# Patient Record
Sex: Female | Born: 1965 | Race: Black or African American | Hispanic: No | Marital: Single | Smoking: Former smoker
Health system: Southern US, Community
[De-identification: ages and names within clinical notes are randomized; demographics above are authoritative.]

## PROBLEM LIST (undated history)

## (undated) DIAGNOSIS — I82409 Acute embolism and thrombosis of unspecified deep veins of unspecified lower extremity: Secondary | ICD-10-CM

## (undated) DIAGNOSIS — I639 Cerebral infarction, unspecified: Secondary | ICD-10-CM

## (undated) DIAGNOSIS — F419 Anxiety disorder, unspecified: Secondary | ICD-10-CM

## (undated) DIAGNOSIS — M329 Systemic lupus erythematosus, unspecified: Secondary | ICD-10-CM

## (undated) DIAGNOSIS — M359 Systemic involvement of connective tissue, unspecified: Secondary | ICD-10-CM

## (undated) DIAGNOSIS — I10 Essential (primary) hypertension: Secondary | ICD-10-CM

## (undated) DIAGNOSIS — G959 Disease of spinal cord, unspecified: Secondary | ICD-10-CM

## (undated) HISTORY — PX: ABDOMINAL HYSTERECTOMY: SHX81

## (undated) HISTORY — PX: BREAST BIOPSY: SHX20

---

## 2009-01-20 ENCOUNTER — Ambulatory Visit: Payer: Self-pay

## 2009-01-26 ENCOUNTER — Ambulatory Visit: Payer: Self-pay

## 2009-07-22 ENCOUNTER — Ambulatory Visit: Payer: Self-pay

## 2010-01-25 ENCOUNTER — Ambulatory Visit: Payer: Self-pay

## 2010-08-16 ENCOUNTER — Ambulatory Visit: Payer: Self-pay | Admitting: Adult Health

## 2010-09-25 ENCOUNTER — Emergency Department: Payer: Self-pay | Admitting: Emergency Medicine

## 2010-10-04 ENCOUNTER — Emergency Department: Payer: Self-pay | Admitting: Unknown Physician Specialty

## 2010-10-06 ENCOUNTER — Emergency Department: Payer: Self-pay | Admitting: Emergency Medicine

## 2011-08-30 ENCOUNTER — Ambulatory Visit: Payer: Self-pay

## 2013-07-10 ENCOUNTER — Emergency Department: Payer: Self-pay | Admitting: Emergency Medicine

## 2013-07-10 LAB — COMPREHENSIVE METABOLIC PANEL
Albumin: 4.1 g/dL (ref 3.4–5.0)
Alkaline Phosphatase: 52 U/L
Anion Gap: 8 (ref 7–16)
BUN: 11 mg/dL (ref 7–18)
Bilirubin,Total: 0.6 mg/dL (ref 0.2–1.0)
Calcium, Total: 9.7 mg/dL (ref 8.5–10.1)
Chloride: 105 mmol/L (ref 98–107)
Co2: 26 mmol/L (ref 21–32)
Creatinine: 0.83 mg/dL (ref 0.60–1.30)
EGFR (African American): >60
EGFR (Non-African Amer.): >60
Glucose: 113 mg/dL — ABNORMAL HIGH (ref 65–99)
Osmolality: 278 (ref 275–301)
Potassium: 3.7 mmol/L (ref 3.5–5.1)
SGOT(AST): 14 U/L — ABNORMAL LOW (ref 15–37)
SGPT (ALT): 12 U/L (ref 12–78)
Sodium: 139 mmol/L (ref 136–145)
Total Protein: 8.4 g/dL — ABNORMAL HIGH (ref 6.4–8.2)

## 2013-07-10 LAB — URINALYSIS, COMPLETE
Bilirubin,UR: NEGATIVE
Blood: NEGATIVE
Glucose,UR: NEGATIVE mg/dL (ref 0–75)
Leukocyte Esterase: NEGATIVE
Nitrite: NEGATIVE
Ph: 6 (ref 4.5–8.0)
Protein: 30
RBC,UR: 5 /HPF (ref 0–5)
Specific Gravity: 1.026 (ref 1.003–1.030)
Squamous Epithelial: 3
WBC UR: 1 /HPF (ref 0–5)

## 2013-07-10 LAB — CBC WITH DIFFERENTIAL/PLATELET
Basophil #: 0 10*3/uL (ref 0.0–0.1)
Basophil %: 0.3 %
Eosinophil #: 0 10*3/uL (ref 0.0–0.7)
Eosinophil %: 0 %
HCT: 42 % (ref 35.0–47.0)
HGB: 14.2 g/dL (ref 12.0–16.0)
Lymphocyte #: 0.8 10*3/uL — ABNORMAL LOW (ref 1.0–3.6)
Lymphocyte %: 8.9 %
MCH: 33 pg (ref 26.0–34.0)
MCHC: 33.8 g/dL (ref 32.0–36.0)
MCV: 98 fL (ref 80–100)
Monocyte #: 0.7 x10 3/mm (ref 0.2–0.9)
Monocyte %: 7.7 %
Neutrophil #: 7.4 10*3/uL — ABNORMAL HIGH (ref 1.4–6.5)
Neutrophil %: 83.1 %
Platelet: 176 10*3/uL (ref 150–440)
RBC: 4.29 10*6/uL (ref 3.80–5.20)
RDW: 12.8 % (ref 11.5–14.5)
WBC: 8.9 10*3/uL (ref 3.6–11.0)

## 2013-07-11 ENCOUNTER — Emergency Department: Payer: Self-pay

## 2013-07-11 LAB — CBC WITH DIFFERENTIAL/PLATELET
Basophil #: 0.1 10*3/uL (ref 0.0–0.1)
Basophil %: 1 %
Eosinophil #: 0 10*3/uL (ref 0.0–0.7)
Eosinophil %: 0.4 %
HCT: 47.2 % — ABNORMAL HIGH (ref 35.0–47.0)
HGB: 15.8 g/dL (ref 12.0–16.0)
Lymphocyte #: 1.9 10*3/uL (ref 1.0–3.6)
Lymphocyte %: 18.1 %
MCH: 33.1 pg (ref 26.0–34.0)
MCHC: 33.5 g/dL (ref 32.0–36.0)
MCV: 99 fL (ref 80–100)
Monocyte #: 0.8 x10 3/mm (ref 0.2–0.9)
Monocyte %: 7.6 %
Neutrophil #: 7.6 10*3/uL — ABNORMAL HIGH (ref 1.4–6.5)
Neutrophil %: 72.9 %
Platelet: 155 10*3/uL (ref 150–440)
RBC: 4.78 10*6/uL (ref 3.80–5.20)
RDW: 13 % (ref 11.5–14.5)
WBC: 10.4 10*3/uL (ref 3.6–11.0)

## 2013-07-11 LAB — COMPREHENSIVE METABOLIC PANEL
Albumin: 3.1 g/dL — ABNORMAL LOW (ref 3.4–5.0)
Alkaline Phosphatase: 37 U/L — ABNORMAL LOW
Anion Gap: 6 — ABNORMAL LOW (ref 7–16)
BUN: 18 mg/dL (ref 7–18)
Bilirubin,Total: 0.4 mg/dL (ref 0.2–1.0)
Calcium, Total: 8.3 mg/dL — ABNORMAL LOW (ref 8.5–10.1)
Chloride: 109 mmol/L — ABNORMAL HIGH (ref 98–107)
Co2: 26 mmol/L (ref 21–32)
Creatinine: 0.93 mg/dL (ref 0.60–1.30)
EGFR (African American): >60
EGFR (Non-African Amer.): >60
Glucose: 93 mg/dL (ref 65–99)
Osmolality: 283 (ref 275–301)
Potassium: 3.7 mmol/L (ref 3.5–5.1)
SGOT(AST): 17 U/L (ref 15–37)
SGPT (ALT): 10 U/L — ABNORMAL LOW (ref 12–78)
Sodium: 141 mmol/L (ref 136–145)
Total Protein: 6.5 g/dL (ref 6.4–8.2)

## 2013-07-11 LAB — URINALYSIS, COMPLETE
Bilirubin,UR: NEGATIVE
Glucose,UR: NEGATIVE mg/dL (ref 0–75)
Leukocyte Esterase: NEGATIVE
Nitrite: NEGATIVE
Ph: 5 (ref 4.5–8.0)
Protein: NEGATIVE
RBC,UR: <1 /HPF (ref 0–5)
Specific Gravity: 1.016 (ref 1.003–1.030)
Squamous Epithelial: 1
WBC UR: 1 /HPF (ref 0–5)

## 2013-07-11 LAB — LIPASE, BLOOD: Lipase: 112 U/L (ref 73–393)

## 2013-07-13 ENCOUNTER — Emergency Department: Payer: Self-pay | Admitting: Emergency Medicine

## 2013-07-13 LAB — CBC WITH DIFFERENTIAL/PLATELET
Basophil #: 0 10*3/uL (ref 0.0–0.1)
Basophil %: 0.3 %
Eosinophil #: 0.1 10*3/uL (ref 0.0–0.7)
Eosinophil %: 1.4 %
HCT: 40.4 % (ref 35.0–47.0)
HGB: 13.5 g/dL (ref 12.0–16.0)
Lymphocyte #: 2.3 10*3/uL (ref 1.0–3.6)
Lymphocyte %: 26.3 %
MCH: 33.2 pg (ref 26.0–34.0)
MCHC: 33.6 g/dL (ref 32.0–36.0)
MCV: 99 fL (ref 80–100)
Monocyte #: 1 x10 3/mm — ABNORMAL HIGH (ref 0.2–0.9)
Monocyte %: 11.3 %
Neutrophil #: 5.2 10*3/uL (ref 1.4–6.5)
Neutrophil %: 60.7 %
Platelet: 160 10*3/uL (ref 150–440)
RBC: 4.08 10*6/uL (ref 3.80–5.20)
RDW: 13.1 % (ref 11.5–14.5)
WBC: 8.6 10*3/uL (ref 3.6–11.0)

## 2013-07-13 LAB — COMPREHENSIVE METABOLIC PANEL
Albumin: 3.1 g/dL — ABNORMAL LOW (ref 3.4–5.0)
Alkaline Phosphatase: 34 U/L — ABNORMAL LOW
Anion Gap: 6 — ABNORMAL LOW (ref 7–16)
BUN: 19 mg/dL — ABNORMAL HIGH (ref 7–18)
Bilirubin,Total: 0.5 mg/dL (ref 0.2–1.0)
Calcium, Total: 8.7 mg/dL (ref 8.5–10.1)
Chloride: 107 mmol/L (ref 98–107)
Co2: 23 mmol/L (ref 21–32)
Creatinine: 0.82 mg/dL (ref 0.60–1.30)
EGFR (African American): >60
EGFR (Non-African Amer.): >60
Glucose: 86 mg/dL (ref 65–99)
Osmolality: 274 (ref 275–301)
Potassium: 3.4 mmol/L — ABNORMAL LOW (ref 3.5–5.1)
SGOT(AST): 22 U/L (ref 15–37)
SGPT (ALT): 10 U/L — ABNORMAL LOW (ref 12–78)
Sodium: 136 mmol/L (ref 136–145)
Total Protein: 6.5 g/dL (ref 6.4–8.2)

## 2013-07-13 LAB — URINALYSIS, COMPLETE
Bilirubin,UR: NEGATIVE
Blood: NEGATIVE
Glucose,UR: NEGATIVE mg/dL (ref 0–75)
Leukocyte Esterase: NEGATIVE
Nitrite: NEGATIVE
Ph: 5 (ref 4.5–8.0)
Protein: NEGATIVE
RBC,UR: 1 /HPF (ref 0–5)
Specific Gravity: 1.029 (ref 1.003–1.030)
Squamous Epithelial: 1
WBC UR: 2 /HPF (ref 0–5)

## 2013-07-13 LAB — TROPONIN I: Troponin-I: 0.05 ng/mL

## 2013-08-15 ENCOUNTER — Emergency Department: Payer: Self-pay | Admitting: Emergency Medicine

## 2013-10-08 ENCOUNTER — Ambulatory Visit: Payer: Self-pay

## 2013-10-30 ENCOUNTER — Ambulatory Visit: Payer: Self-pay

## 2014-08-10 ENCOUNTER — Emergency Department: Payer: Self-pay | Admitting: Emergency Medicine

## 2014-11-11 ENCOUNTER — Emergency Department
Admission: EM | Admit: 2014-11-11 | Discharge: 2014-11-11 | Disposition: A | Discharge status: Home / Self Care | Payer: Self-pay | Attending: Emergency Medicine | Admitting: Emergency Medicine

## 2014-11-11 ENCOUNTER — Emergency Department: Payer: Self-pay

## 2014-11-11 ENCOUNTER — Other Ambulatory Visit: Payer: Self-pay

## 2014-11-11 DIAGNOSIS — R110 Nausea: Secondary | ICD-10-CM | POA: Insufficient documentation

## 2014-11-11 DIAGNOSIS — R251 Tremor, unspecified: Secondary | ICD-10-CM | POA: Insufficient documentation

## 2014-11-11 DIAGNOSIS — F410 Panic disorder [episodic paroxysmal anxiety] without agoraphobia: Secondary | ICD-10-CM | POA: Insufficient documentation

## 2014-11-11 DIAGNOSIS — Z79899 Other long term (current) drug therapy: Secondary | ICD-10-CM | POA: Insufficient documentation

## 2014-11-11 DIAGNOSIS — I10 Essential (primary) hypertension: Secondary | ICD-10-CM | POA: Insufficient documentation

## 2014-11-11 DIAGNOSIS — F419 Anxiety disorder, unspecified: Secondary | ICD-10-CM

## 2014-11-11 HISTORY — DX: Essential (primary) hypertension: I10

## 2014-11-11 HISTORY — DX: Anxiety disorder, unspecified: F41.9

## 2014-11-11 LAB — BASIC METABOLIC PANEL
Anion gap: 12 (ref 5–15)
BUN: 22 mg/dL — ABNORMAL HIGH (ref 6–20)
CO2: 25 mmol/L (ref 22–32)
Calcium: 9.7 mg/dL (ref 8.9–10.3)
Chloride: 97 mmol/L — ABNORMAL LOW (ref 101–111)
Creatinine, Ser: 0.85 mg/dL (ref 0.44–1.00)
GFR calc Af Amer: >60 mL/min (ref >60)
GFR calc non Af Amer: >60 mL/min (ref >60)
Glucose, Bld: 102 mg/dL — ABNORMAL HIGH (ref 65–99)
Potassium: 3.4 mmol/L — ABNORMAL LOW (ref 3.5–5.1)
Sodium: 134 mmol/L — ABNORMAL LOW (ref 135–145)

## 2014-11-11 LAB — CBC
HCT: 43.8 % (ref 35.0–47.0)
Hemoglobin: 14.7 g/dL (ref 12.0–16.0)
MCH: 33 pg (ref 26.0–34.0)
MCHC: 33.5 g/dL (ref 32.0–36.0)
MCV: 98.7 fL (ref 80.0–100.0)
Platelets: 197 10*3/uL (ref 150–440)
RBC: 4.44 MIL/uL (ref 3.80–5.20)
RDW: 13.1 % (ref 11.5–14.5)
WBC: 7.4 10*3/uL (ref 3.6–11.0)

## 2014-11-11 LAB — TROPONIN I: Troponin I: <0.03 ng/mL (ref <0.031)

## 2014-11-11 MED ORDER — ONDANSETRON 4 MG PO TBDP
ORAL_TABLET | ORAL | Status: AC
  Administered 2014-11-11: 4 mg via ORAL
  Filled 2014-11-11: qty 1

## 2014-11-11 MED ORDER — ONDANSETRON 4 MG PO TBDP
4.0000 mg | ORAL_TABLET | Freq: Once | ORAL | Status: AC
  Administered 2014-11-11: 4 mg via ORAL

## 2014-11-11 MED ORDER — DIAZEPAM 5 MG PO TABS
10.0000 mg | ORAL_TABLET | Freq: Once | ORAL | Status: AC
  Administered 2014-11-11: 10 mg via ORAL

## 2014-11-11 MED ORDER — DIAZEPAM 5 MG PO TABS: 5.0000 mg | ORAL_TABLET | Freq: Three times a day (TID) | ORAL | Status: DC | PRN

## 2014-11-11 MED ORDER — DIAZEPAM 5 MG PO TABS
ORAL_TABLET | ORAL | Status: AC
  Administered 2014-11-11: 10 mg via ORAL
  Filled 2014-11-11: qty 2

## 2014-11-11 NOTE — ED Notes (Signed)
Pt c/o nausea, weakness, "feels like my heart is racing" since last Thursday; pt has not been seen by PCP. Pt states that she was dx with anxiety last week and started on citalopram. Took for 2 days and began to feel these sx so she stopped taking the medication. Pt alert and oriented X4, active, cooperative, pt in NAD. RR even and unlabored, color WNL.

## 2014-11-11 NOTE — ED Provider Notes (Signed)
[facility] Emergency Department Provider Note     Time seen: ----------------------------------------- 12:30 PM on 11/11/2014 -----------------------------------------    I have reviewed the triage vital signs and the nursing notes.   HISTORY  Chief Complaint Nausea and Weakness    HPI Valerie Bell is a 49 y.o. female clear for nausea weakness, and feeling her heart is racing. Patient states she has not seen her primary care doctor. She was diagnosed with anxiety last week and started on citalopram. She states she took it for 2 days and began to feel the symptoms so she stopped taking it.Patient reports intense anxiety to me, nothing seems to make it better or worse. I have seen her recently for same.     Past Medical History  Diagnosis Date  . Anxiety   . Hypertension     There are no active problems to display for this patient.   History reviewed. No pertinent past surgical history.  No current outpatient prescriptions on file.  Allergies Review of patient's allergies indicates no known allergies.  No family history on file.  Social History History  Substance Use Topics  . Smoking status: Never Smoker   . Smokeless tobacco: Not on file  . Alcohol Use: No    Review of Systems Constitutional: Negative for fever. Eyes: Negative for visual changes. ENT: Negative for sore throat. Cardiovascular: Negative for chest pain, she describes not like sensation in her breasts Respiratory: Negative for shortness of breath. Gastrointestinal: Negative for abdominal pain, vomiting and diarrhea. Genitourinary: Negative for dysuria. Musculoskeletal: Negative for back pain. Skin: Negative for rash. Neurological: Negative for headaches, focal weakness or numbness. Psychiatric: Positive for anxiety 10-point ROS otherwise negative.  ____________________________________________   PHYSICAL EXAM:  VITAL SIGNS: ED Triage Vitals  Enc Vitals  Group     BP 11/11/14 1216 148/109 mmHg     Pulse Rate 11/11/14 1216 103     Resp 11/11/14 1216 20     Temp 11/11/14 1216 98.2 F (36.8 C)     Temp Source 11/11/14 1216 Oral     SpO2 11/11/14 1216 100 %     Weight 11/11/14 1216 107 lb (48.535 kg)     Height 11/11/14 1216 5\' 3"  (1.6 m)     Head Cir --      Peak Flow --      Pain Score --      Pain Loc --      Pain Edu? --      Excl. in [place]? --     Constitutional: Alert and oriented. Moderate to severe anxiety, tremulousness Eyes: Conjunctivae are normal. PERRL. Normal extraocular movements. ENT   Head: Normocephalic and atraumatic.   Nose: No congestion/rhinnorhea.   Mouth/Throat: Mucous membranes are moist.   Neck: No stridor. Hematological/Lymphatic/Immunilogical: No cervical lymphadenopathy. Cardiovascular: Normal and symmetric distal pulses are present in all extremities. No murmurs, rubs, or gallops. Tachycardia Respiratory: Normal respiratory effort without tachypnea nor retractions. Breath sounds are clear and equal bilaterally. No wheezes/rales/rhonchi. Gastrointestinal: Soft and nontender. No distention. No abdominal bruits. There is no CVA tenderness. Musculoskeletal: Nontender with normal range of motion in all extremities. No joint effusions.  No lower extremity tenderness nor edema. Neurologic:  Normal speech and language. No gross focal neurologic deficits are appreciated. Speech is normal. No gait instability. Tremor Skin:  Skin is warm, dry and intact. No rash noted. Psychiatric: Mood and affect are normal. Speech and behavior are normal. Patient exhibits appropriate insight and judgment.  ____________________________________________  LABS (pertinent positives/negatives)  Labs Reviewed  BASIC METABOLIC PANEL - Abnormal; Notable for the following:    Sodium 134 (*)    Potassium 3.4 (*)    Chloride 97 (*)    Glucose, Bld 102 (*)    BUN 22 (*)    All other components within normal limits  CBC   TROPONIN I  CBG MONITORING, ED    EKG: Interpreted by me. Sinus rhythm with premature HO complexes, septal infarct likely, age indeterminate, T-wave abnormalities inferior and laterally. Long QT interval. Rate is 89  ____________________________________________  ED COURSE:  Pertinent labs & imaging results that were available during my care of the patient were reviewed by me and considered in my medical decision making (see chart for details).   ____________________________________________   RADIOLOGY  Chest x-ray is unremarkable  ____________________________________________    FINAL ASSESSMENT AND PLAN  Anxiety   Plan: I discussed the abnormal EKG with the patient, she will follow-up with her primary care doctor for this. She does not have any evidence of infarction here, with troponin being negative. She is feeling much better and is no longer tremulous after the Valium that I gave her. I advised to restart her Celexa and I will write for Valium when necessary for her symptoms. Stable for DC    Earleen Newport, MD   Earleen Newport, MD 11/11/14 [phone]

## 2014-11-11 NOTE — Discharge Instructions (Signed)
Panic Attacks °Panic attacks are sudden, short feelings of great fear or discomfort. You may have them for no reason when you are relaxed, when you are uneasy (anxious), or when you are sleeping.  °HOME CARE °· Take all your medicines as told. °· Check with your doctor before starting new medicines. °· Keep all doctor visits. °GET HELP IF: °· You are not able to take your medicines as told. °· Your symptoms do not get better. °· Your symptoms get worse. °GET HELP RIGHT AWAY IF: °· Your attacks seem different than your normal attacks. °· You have thoughts about hurting yourself or others. °· You take panic attack medicine and you have a side effect. °MAKE SURE YOU: °· Understand these instructions. °· Will watch your condition. °· Will get help right away if you are not doing well or get worse. °Document Released: 07/08/2010 Document Revised: 03/26/2013 Document Reviewed: 01/17/2013 °[employer]® Patient Information ©2015 [employer]. This information is not intended to replace advice given to you by your health care provider. Make sure you discuss any questions you have with your health care provider. ° °

## 2014-11-11 NOTE — ED Notes (Signed)
Patient unable to accurately qualify or quantify chest pain or shortness of breath

## 2014-11-11 NOTE — ED Notes (Signed)
Patient stable at time of dc 

## 2014-11-30 ENCOUNTER — Other Ambulatory Visit: Payer: Self-pay

## 2014-11-30 ENCOUNTER — Encounter: Payer: Self-pay | Admitting: Emergency Medicine

## 2014-11-30 ENCOUNTER — Emergency Department
Admission: EM | Admit: 2014-11-30 | Discharge: 2014-11-30 | Disposition: A | Discharge status: Home / Self Care | Payer: Self-pay | Attending: Emergency Medicine | Admitting: Emergency Medicine

## 2014-11-30 DIAGNOSIS — R110 Nausea: Secondary | ICD-10-CM | POA: Insufficient documentation

## 2014-11-30 DIAGNOSIS — I10 Essential (primary) hypertension: Secondary | ICD-10-CM | POA: Insufficient documentation

## 2014-11-30 DIAGNOSIS — Z79899 Other long term (current) drug therapy: Secondary | ICD-10-CM | POA: Insufficient documentation

## 2014-11-30 DIAGNOSIS — R42 Dizziness and giddiness: Secondary | ICD-10-CM | POA: Insufficient documentation

## 2014-11-30 DIAGNOSIS — E876 Hypokalemia: Secondary | ICD-10-CM | POA: Insufficient documentation

## 2014-11-30 DIAGNOSIS — Z87891 Personal history of nicotine dependence: Secondary | ICD-10-CM | POA: Insufficient documentation

## 2014-11-30 DIAGNOSIS — Z3202 Encounter for pregnancy test, result negative: Secondary | ICD-10-CM | POA: Insufficient documentation

## 2014-11-30 LAB — COMPREHENSIVE METABOLIC PANEL
ALT: 12 U/L — ABNORMAL LOW (ref 14–54)
AST: 24 U/L (ref 15–41)
Albumin: 4.2 g/dL (ref 3.5–5.0)
Alkaline Phosphatase: 39 U/L (ref 38–126)
Anion gap: 9 (ref 5–15)
BUN: 12 mg/dL (ref 6–20)
CO2: 30 mmol/L (ref 22–32)
Calcium: 9.3 mg/dL (ref 8.9–10.3)
Chloride: 98 mmol/L — ABNORMAL LOW (ref 101–111)
Creatinine, Ser: 0.77 mg/dL (ref 0.44–1.00)
GFR calc Af Amer: >60 mL/min (ref >60)
GFR calc non Af Amer: >60 mL/min (ref >60)
Glucose, Bld: 99 mg/dL (ref 65–99)
Potassium: 2.9 mmol/L — CL (ref 3.5–5.1)
Sodium: 137 mmol/L (ref 135–145)
Total Bilirubin: 0.3 mg/dL (ref 0.3–1.2)
Total Protein: 8.6 g/dL — ABNORMAL HIGH (ref 6.5–8.1)

## 2014-11-30 LAB — TSH: TSH: 2.777 u[IU]/mL (ref 0.350–4.500)

## 2014-11-30 LAB — URINALYSIS COMPLETE WITH MICROSCOPIC (ARMC ONLY)
Bacteria, UA: NONE SEEN
Bilirubin Urine: NEGATIVE
Glucose, UA: NEGATIVE mg/dL
Hgb urine dipstick: NEGATIVE
Ketones, ur: NEGATIVE mg/dL
Leukocytes, UA: NEGATIVE
Nitrite: NEGATIVE
Protein, ur: NEGATIVE mg/dL
Specific Gravity, Urine: 1.016 (ref 1.005–1.030)
pH: 7 (ref 5.0–8.0)

## 2014-11-30 LAB — CBC
HCT: 37.3 % (ref 35.0–47.0)
Hemoglobin: 12.6 g/dL (ref 12.0–16.0)
MCH: 33.1 pg (ref 26.0–34.0)
MCHC: 33.7 g/dL (ref 32.0–36.0)
MCV: 98.1 fL (ref 80.0–100.0)
Platelets: 205 10*3/uL (ref 150–440)
RBC: 3.8 MIL/uL (ref 3.80–5.20)
RDW: 13.1 % (ref 11.5–14.5)
WBC: 6.7 10*3/uL (ref 3.6–11.0)

## 2014-11-30 LAB — TROPONIN I: Troponin I: <0.03 ng/mL (ref <0.031)

## 2014-11-30 MED ORDER — POTASSIUM CHLORIDE CRYS ER 20 MEQ PO TBCR
40.0000 meq | EXTENDED_RELEASE_TABLET | ORAL | Status: AC
  Administered 2014-11-30: 40 meq via ORAL

## 2014-11-30 MED ORDER — SODIUM CHLORIDE 0.9 % IV BOLUS (SEPSIS)
1000.0000 mL | Freq: Once | INTRAVENOUS | Status: AC
  Administered 2014-11-30: 1000 mL via INTRAVENOUS

## 2014-11-30 MED ORDER — POTASSIUM CHLORIDE ER 10 MEQ PO TBCR: 10.0000 meq | EXTENDED_RELEASE_TABLET | Freq: Every day | ORAL | Status: DC

## 2014-11-30 MED ORDER — POTASSIUM CHLORIDE CRYS ER 20 MEQ PO TBCR
EXTENDED_RELEASE_TABLET | ORAL | Status: AC
  Administered 2014-11-30: 40 meq via ORAL
  Filled 2014-11-30: qty 2

## 2014-11-30 NOTE — ED Notes (Signed)
States weak and dizzy since 25th of May, here for previous work up

## 2014-11-30 NOTE — ED Provider Notes (Signed)
[facility] Emergency Department Provider Note  ____________________________________________  Time seen: Approximately 3:28 PM  I have reviewed the triage vital signs and the nursing notes.   HISTORY  Chief Complaint Weakness    HPI Valerie Bell is a 49 y.o. female who is been having a feeling of nausea and feeling very tired for about one month now. She states her symptoms have never really gotten better. She seen her primary care doctor, as well as been to the ER once, and has set up follow-up with cardiology in about 2 weeks at [facility].  She continues to feel tired especially while standing at work. She occasionally feels nauseated, she has not vomited. She has not had any fever or chills. No headache. No weakness in arm or leg, no difficulty speaking but she just feels very weak and "fatigued" all the time.  She has noticed that she has increased urination recently with an urge to urinate. This may or may not be associated with changing her blood pressure medicine to HCTZ around May 20.   Past Medical History  Diagnosis Date  . Anxiety   . Hypertension     There are no active problems to display for this patient.   Past Surgical History  Procedure Laterality Date  . Abdominal hysterectomy      Current Outpatient Rx  Name  Route  Sig  Dispense  Refill  . diazepam (VALIUM) 5 MG tablet   Oral   Take 1 tablet (5 mg total) by mouth every 8 (eight) hours as needed for muscle spasms.   20 tablet   0   . potassium chloride (K-DUR) 10 MEQ tablet   Oral   Take 1 tablet (10 mEq total) by mouth daily.   5 tablet   0     Allergies Review of patient's allergies indicates no known allergies.  No family history on file.  Social History History  Substance Use Topics  . Smoking status: Former Research scientist (life sciences)  . Smokeless tobacco: Not on file  . Alcohol Use: No    Review of Systems Constitutional: No fever/chills Eyes: No visual changes. ENT: No sore  throat. Cardiovascular: Denies chest pain though she was told her EKG is abnormal and has set up follow-up with cardiology. Respiratory: Denies shortness of breath. Gastrointestinal: No abdominal pain.  no vomiting.  No diarrhea.  No constipation. Genitourinary: Negative for dysuria. Increased urgency Musculoskeletal: Negative for back pain. Skin: Negative for rash. Neurological: Negative for headaches, focal weakness or numbness.  10-point ROS otherwise negative.  ____________________________________________   PHYSICAL EXAM:  VITAL SIGNS: ED Triage Vitals  Enc Vitals Group     BP 11/30/14 1149 98/79 mmHg     Pulse Rate 11/30/14 1149 92     Resp 11/30/14 1149 20     Temp 11/30/14 1149 98.2 F (36.8 C)     Temp Source 11/30/14 1149 Oral     SpO2 11/30/14 1149 100 %     Weight 11/30/14 1149 102 lb (46.267 kg)     Height 11/30/14 1149 5\' 3"  (1.6 m)     Head Cir --      Peak Flow --      Pain Score 11/30/14 1150 0     Pain Loc --      Pain Edu? --      Excl. in [place]? --    Constitutional: Alert and oriented. Overall fatigued  appearing and in no acute distress. The patient does appear underweight for  her given height and age. Eyes: Conjunctivae are normal. PERRL. EOMI. Head: Atraumatic. Nose: No congestion/rhinnorhea. Mouth/Throat: Mucous membranes are slightly dry.  Oropharynx non-erythematous. Neck: No stridor.   Cardiovascular: Normal rate, regular rhythm. Grossly normal heart sounds.  Good peripheral circulation. Respiratory: Normal respiratory effort.  No retractions. Lungs CTAB. Gastrointestinal: Soft and nontender. No distention. No abdominal bruits. No CVA tenderness. Musculoskeletal: No lower extremity tenderness nor edema.  No joint effusions. Neurologic:  Normal speech and language. No gross focal neurologic deficits are appreciated. Speech is normal. No gait instability. Skin:  Skin is warm, dry and intact. No rash noted. Psychiatric: Mood and affect are normal.  Speech and behavior are normal.  ____________________________________________   LABS (all labs ordered are listed, but only abnormal results are displayed)  Labs Reviewed  COMPREHENSIVE METABOLIC PANEL - Abnormal; Notable for the following:    Potassium 2.9 (*)    Chloride 98 (*)    Total Protein 8.6 (*)    ALT 12 (*)    All other components within normal limits  URINALYSIS COMPLETEWITH MICROSCOPIC ([facility] ONLY) - Abnormal; Notable for the following:    Color, Urine YELLOW (*)    APPearance CLEAR (*)    Squamous Epithelial / LPF 0-5 (*)    All other components within normal limits  CBC  TROPONIN I  TSH  POC URINE PREG, ED   ____________________________________________  EKG  ED ECG REPORT I, Zhanna Melin, the attending physician, personally viewed and interpreted this ECG.  Date: 11/30/2014 EKG Time: 1155 Rate: 90 Rhythm: normal sinus rhythm with occasional PACs QRS Axis: normal Intervals: normal ST/T Wave abnormalities: T wave inversions in ii,iii as well as V4 through V6  Narrative Interpretation: Sinus rhythm with frequent PACs and inferolateral T-wave abnormalities. In comparison to previous EKG from 11/11/2014  see no significant changes.    ____________________________________________  RADIOLOGY   ____________________________________________   PROCEDURES  Procedure(s) performed: None  Critical Care performed: No  ____________________________________________   INITIAL IMPRESSION / ASSESSMENT AND PLAN / ED COURSE  Pertinent labs & imaging results that were available during my care of the patient were reviewed by me and considered in my medical decision making (see chart for details).  Patient with ongoing weakness for about a month. She does appear fatigued, but has a overall reassuring exam. She is slightly underweight. On laboratory testing her potassium is noted to be low, this is likely an effect of being on her new diuretic hydrochlorothiazide.  We'll replete this in the ER. He comes of the chronicity of your ongoing weakness, I will add on a thyroid study and we will obtain a urinalysis. We will check orthostatics and hydrated. The patient does have a primary care doctor who is also following her for this weakness, and has follow-up with cardiology regarding her EKG. Her troponin is again normal today and her EKG shows no major changes from previous. She is not having any active chest pain.  I did discuss with the patient that there are many things contribute to weakness and weight loss, among these could be electrolyte problems, chronic infections, metabolic abnormalities, or even something as severe as an undiagnosed cancer. Difficult to make a diagnosis of a undiagnosed tumor in the ER, and I discussed that with the patient and she will follow up closely with her primary care doctor as well as cardiology of discharge. ____________________________________________  ----------------------------------------- 5:57 PM on 11/30/2014 -----------------------------------------  Patient's labs are reviewed. Her TSH is normal. She feels improved after hydration.  Still don't have a clear cause for her lightheadedness, though the chronicity argues against an emergent cause. Her potassium is slightly low and we will replete this likely due to her hydrochlorothiazide. I will provide her a short prescription of potassium. She will follow-up with her for further and ongoing evaluation at this point.  FINAL CLINICAL IMPRESSION(S) / ED DIAGNOSES  Final diagnoses:  Lightheaded  Hypokalemia due to loss of potassium      Delman Kitten, MD 11/30/14 1757

## 2014-11-30 NOTE — Discharge Instructions (Signed)
Dizziness  Please call your primary care doctor to set up follow-up within the next 1-3 days. Return to the ER right away should you develop severe symptoms, pass out, a fever, feel dehydrated, have increased weakness, trouble speaking, chest pain, or other new concerns arise.  Dizziness is a common problem. It is a feeling of unsteadiness or light-headedness. You may feel like you are about to faint. Dizziness can lead to injury if you stumble or fall. A person of any age group can suffer from dizziness, but dizziness is more common in older adults. CAUSES  Dizziness can be caused by many different things, including:  Middle ear problems.  Standing for too long.  Infections.  An allergic reaction.  Aging.  An emotional response to something, such as the sight of blood.  Side effects of medicines.  Tiredness.  Problems with circulation or blood pressure.  Excessive use of alcohol or medicines, or illegal drug use.  Breathing too fast (hyperventilation).  An irregular heart rhythm (arrhythmia).  A low red blood cell count (anemia).  Pregnancy.  Vomiting, diarrhea, fever, or other illnesses that cause body fluid loss (dehydration).  Diseases or conditions such as Parkinson's disease, high blood pressure (hypertension), diabetes, and thyroid problems.  Exposure to extreme heat. DIAGNOSIS  Your health care provider will ask about your symptoms, perform a physical exam, and perform an electrocardiogram (ECG) to record the electrical activity of your heart. Your health care provider may also perform other heart or blood tests to determine the cause of your dizziness. These may include:  Transthoracic echocardiogram (TTE). During echocardiography, sound waves are used to evaluate how blood flows through your heart.  Transesophageal echocardiogram (TEE).  Cardiac monitoring. This allows your health care provider to monitor your heart rate and rhythm in real time.  Holter  monitor. This is a portable device that records your heartbeat and can help diagnose heart arrhythmias. It allows your health care provider to track your heart activity for several days if needed.  Stress tests by exercise or by giving medicine that makes the heart beat faster. TREATMENT  Treatment of dizziness depends on the cause of your symptoms and can vary greatly. HOME CARE INSTRUCTIONS   Drink enough fluids to keep your urine clear or pale yellow. This is especially important in very hot weather. In older adults, it is also important in cold weather.  Take your medicine exactly as directed if your dizziness is caused by medicines. When taking blood pressure medicines, it is especially important to get up slowly.  Rise slowly from chairs and steady yourself until you feel okay.  In the morning, first sit up on the side of the bed. When you feel okay, stand slowly while holding onto something until you know your balance is fine.  Move your legs often if you need to stand in one place for a long time. Tighten and relax your muscles in your legs while standing.  Have someone stay with you for 1-2 days if dizziness continues to be a problem. Do this until you feel you are well enough to stay alone. Have the person call your health care provider if he or she notices changes in you that are concerning.  Do not drive or use heavy machinery if you feel dizzy.  Do not drink alcohol. SEEK IMMEDIATE MEDICAL CARE IF:   Your dizziness or light-headedness gets worse.  You feel nauseous or vomit.  You have problems talking, walking, or using your arms, hands, or legs.  You feel weak.  You are not thinking clearly or you have trouble forming sentences. It may take a friend or family member to notice this.  You have chest pain, abdominal pain, shortness of breath, or sweating.  Your vision changes.  You notice any bleeding.  You have side effects from medicine that seems to be getting  worse rather than better. MAKE SURE YOU:   Understand these instructions.  Will watch your condition.  Will get help right away if you are not doing well or get worse. Document Released: 11/29/2000 Document Revised: 06/10/2013 Document Reviewed: 12/23/2010 [facility] Patient Information 2015 [place], [place]. This information is not intended to replace advice given to you by your health care provider. Make sure you discuss any questions you have with your health care provider.

## 2014-12-02 LAB — POCT PREGNANCY, URINE: Preg Test, Ur: NEGATIVE

## 2014-12-30 ENCOUNTER — Other Ambulatory Visit: Payer: Self-pay | Admitting: Oncology

## 2014-12-30 ENCOUNTER — Ambulatory Visit: Payer: Self-pay | Attending: Oncology

## 2014-12-30 ENCOUNTER — Ambulatory Visit
Admission: RE | Admit: 2014-12-30 | Discharge: 2014-12-30 | Disposition: A | Discharge status: Home / Self Care | Payer: Self-pay | Source: Ambulatory Visit | Attending: Oncology | Admitting: Oncology

## 2014-12-30 VITALS — BP 155/104 | HR 73 | Temp 98.6°F | Resp 18 | Ht 64.96 in | Wt 104.3 lb

## 2014-12-30 DIAGNOSIS — R928 Other abnormal and inconclusive findings on diagnostic imaging of breast: Secondary | ICD-10-CM | POA: Insufficient documentation

## 2014-12-30 DIAGNOSIS — N63 Unspecified lump in unspecified breast: Secondary | ICD-10-CM

## 2014-12-30 NOTE — Progress Notes (Signed)
Subjective:     Patient ID: Valerie Bell, female   DOB: Jan 16, 1966, 49 y.o.   MRN: [id number]  HPI   Review of Systems     Objective:   Physical Exam  Pulmonary/Chest: Right breast exhibits mass and tenderness. Right breast exhibits no inverted nipple, no nipple discharge and no skin change. Left breast exhibits mass and tenderness. Left breast exhibits no nipple discharge and no skin change. Breasts are symmetrical.         Assessment:     49 year old patient presents for [facility] clinic visit. Patient screened, and meets B[facility]CP eligibility.  Patient does not have insurance, Medicare or Medicaid.  Handout given on Affordable Care Act. Instructed patient on breast self-exam using teach back method.  Palpated bilateral thickenings upper outer quadrants with associated tenderness. Patient being followed by PCP for intemittent chest pain, and hypertension.  Also stomach pain and nausea.    Plan:     Sent for bialteral diagnostic mammogrma, and bilateral breast ultrasound.

## 2014-12-31 NOTE — Progress Notes (Signed)
Letter mailed from [facility] to notify of normal mammogram results.  Patient to return in one year for annual screening.  Copy to [facility]. 

## 2015-01-27 ENCOUNTER — Other Ambulatory Visit: Payer: Self-pay | Admitting: Family Medicine

## 2015-01-27 DIAGNOSIS — R634 Abnormal weight loss: Secondary | ICD-10-CM

## 2015-01-28 ENCOUNTER — Ambulatory Visit
Admission: RE | Admit: 2015-01-28 | Discharge: 2015-01-28 | Disposition: A | Discharge status: Home / Self Care | Payer: Self-pay | Source: Ambulatory Visit | Attending: Family Medicine | Admitting: Family Medicine

## 2015-01-28 DIAGNOSIS — R918 Other nonspecific abnormal finding of lung field: Secondary | ICD-10-CM | POA: Insufficient documentation

## 2015-01-28 DIAGNOSIS — R634 Abnormal weight loss: Secondary | ICD-10-CM | POA: Insufficient documentation

## 2015-01-28 MED ORDER — IOHEXOL 300 MG/ML  SOLN
100.0000 mL | Freq: Once | INTRAMUSCULAR | Status: AC | PRN
  Administered 2015-01-28: 85 mL via INTRAVENOUS

## 2015-10-02 ENCOUNTER — Emergency Department
Admission: EM | Admit: 2015-10-02 | Discharge: 2015-10-02 | Disposition: A | Discharge status: Home / Self Care | Payer: Self-pay | Attending: Emergency Medicine | Admitting: Emergency Medicine

## 2015-10-02 ENCOUNTER — Encounter: Payer: Self-pay | Admitting: Emergency Medicine

## 2015-10-02 DIAGNOSIS — Z791 Long term (current) use of non-steroidal anti-inflammatories (NSAID): Secondary | ICD-10-CM | POA: Insufficient documentation

## 2015-10-02 DIAGNOSIS — M25571 Pain in right ankle and joints of right foot: Secondary | ICD-10-CM | POA: Insufficient documentation

## 2015-10-02 DIAGNOSIS — F419 Anxiety disorder, unspecified: Secondary | ICD-10-CM | POA: Insufficient documentation

## 2015-10-02 DIAGNOSIS — Z87891 Personal history of nicotine dependence: Secondary | ICD-10-CM | POA: Insufficient documentation

## 2015-10-02 DIAGNOSIS — M25579 Pain in unspecified ankle and joints of unspecified foot: Secondary | ICD-10-CM

## 2015-10-02 DIAGNOSIS — I10 Essential (primary) hypertension: Secondary | ICD-10-CM | POA: Insufficient documentation

## 2015-10-02 DIAGNOSIS — M25572 Pain in left ankle and joints of left foot: Secondary | ICD-10-CM | POA: Insufficient documentation

## 2015-10-02 LAB — CBC WITH DIFFERENTIAL/PLATELET
Basophils Absolute: 0 10*3/uL (ref 0–0.1)
Basophils Relative: 1 %
Eosinophils Absolute: 0.2 10*3/uL (ref 0–0.7)
Eosinophils Relative: 3 %
HCT: 32.6 % — ABNORMAL LOW (ref 35.0–47.0)
Hemoglobin: 11.1 g/dL — ABNORMAL LOW (ref 12.0–16.0)
Lymphocytes Relative: 22 %
Lymphs Abs: 1.4 10*3/uL (ref 1.0–3.6)
MCH: 32.8 pg (ref 26.0–34.0)
MCHC: 34.2 g/dL (ref 32.0–36.0)
MCV: 96.1 fL (ref 80.0–100.0)
Monocytes Absolute: 0.6 10*3/uL (ref 0.2–0.9)
Monocytes Relative: 9 %
Neutro Abs: 4.4 10*3/uL (ref 1.4–6.5)
Neutrophils Relative %: 67 %
Platelets: 190 10*3/uL (ref 150–440)
RBC: 3.39 MIL/uL — ABNORMAL LOW (ref 3.80–5.20)
RDW: 13.2 % (ref 11.5–14.5)
WBC: 6.5 10*3/uL (ref 3.6–11.0)

## 2015-10-02 LAB — COMPREHENSIVE METABOLIC PANEL
ALT: 14 U/L (ref 14–54)
AST: 24 U/L (ref 15–41)
Albumin: 4.6 g/dL (ref 3.5–5.0)
Alkaline Phosphatase: 45 U/L (ref 38–126)
Anion gap: 4 — ABNORMAL LOW (ref 5–15)
BUN: 17 mg/dL (ref 6–20)
CO2: 28 mmol/L (ref 22–32)
Calcium: 9.6 mg/dL (ref 8.9–10.3)
Chloride: 104 mmol/L (ref 101–111)
Creatinine, Ser: 1.05 mg/dL — ABNORMAL HIGH (ref 0.44–1.00)
GFR calc Af Amer: >60 mL/min (ref >60)
GFR calc non Af Amer: >60 mL/min (ref >60)
Glucose, Bld: 87 mg/dL (ref 65–99)
Potassium: 4.3 mmol/L (ref 3.5–5.1)
Sodium: 136 mmol/L (ref 135–145)
Total Bilirubin: 0.3 mg/dL (ref 0.3–1.2)
Total Protein: 8.8 g/dL — ABNORMAL HIGH (ref 6.5–8.1)

## 2015-10-02 LAB — SEDIMENTATION RATE: Sed Rate: 44 mm/hr — ABNORMAL HIGH (ref 0–20)

## 2015-10-02 MED ORDER — KETOROLAC TROMETHAMINE 60 MG/2ML IM SOLN
60.0000 mg | Freq: Once | INTRAMUSCULAR | Status: AC
  Administered 2015-10-02: 60 mg via INTRAMUSCULAR
  Filled 2015-10-02: qty 2

## 2015-10-02 MED ORDER — MELOXICAM 15 MG PO TABS: 15.0000 mg | ORAL_TABLET | Freq: Every day | ORAL | Status: DC

## 2015-10-02 MED ORDER — OXYCODONE-ACETAMINOPHEN 7.5-325 MG PO TABS: 1.0000 | ORAL_TABLET | ORAL | Status: AC | PRN

## 2015-10-02 MED ORDER — TRAMADOL HCL 50 MG PO TABS
50.0000 mg | ORAL_TABLET | Freq: Once | ORAL | Status: AC
Start: 2015-10-02 — End: 2015-10-02
  Administered 2015-10-02: 50 mg via ORAL
  Filled 2015-10-02: qty 1

## 2015-10-02 NOTE — ED Notes (Signed)
Throbbing pain 5th toe R foot and 1sr, 2nd and 3rd toes L foot x 1 month. Denies injury.

## 2015-10-02 NOTE — ED Notes (Signed)
Discussed discharge instructions, prescriptions, and follow-up care with patient. No questions or concerns at this time. Pt stable at discharge.  

## 2015-10-02 NOTE — ED Provider Notes (Signed)
[facility] Emergency Department Provider Note  ____________________________________________  Time seen: Approximately 1:55 PM  I have reviewed the triage vital signs and the nursing notes.   HISTORY  Chief Complaint Toe Pain    HPI Valerie Bell is a 50 y.o. female patient complain of bilateral foot pain for greater than 1 month. Patient denies any injury. Patient described a pain as throbbing. Patient stated tablet is also shooting pain to the foot. Patient states she believed onset her complaint she stopped taking potassium. Patient is rating the pain as a 10 over 10. Patient describes the pain as "sharp". Patient states she discussed this with her family doctor 2 days ago but no diagnosis or definitive treatment was prescribed. No palliative measures taken for her complaint.   Past Medical History  Diagnosis Date  . Anxiety   . Hypertension     There are no active problems to display for this patient.   Past Surgical History  Procedure Laterality Date  . Abdominal hysterectomy    . Breast biopsy Right     negative    Current Outpatient Rx  Name  Route  Sig  Dispense  Refill  . diazepam (VALIUM) 5 MG tablet   Oral   Take 1 tablet (5 mg total) by mouth every 8 (eight) hours as needed for muscle spasms.   20 tablet   0   . meloxicam (MOBIC) 15 MG tablet   Oral   Take 1 tablet (15 mg total) by mouth daily.   30 tablet   2   . oxyCODONE-acetaminophen (PERCOCET) 7.5-325 MG tablet   Oral   Take 1 tablet by mouth every 4 (four) hours as needed for severe pain.   20 tablet   0   . potassium chloride (K-DUR) 10 MEQ tablet   Oral   Take 1 tablet (10 mEq total) by mouth daily.   5 tablet   0     Allergies Review of patient's allergies indicates no known allergies.  No family history on file.  Social History Social History  Substance Use Topics  . Smoking status: Former Smoker -- 0.30 packs/day for 4 years    Types: Cigarettes   Quit date: 12/30/2003  . Smokeless tobacco: None  . Alcohol Use: No    Review of Systems Constitutional: No fever/chills Eyes: No visual changes. ENT: No sore throat. Cardiovascular: Denies chest pain. Respiratory: Denies shortness of breath. Gastrointestinal: No abdominal pain.  No nausea, no vomiting.  No diarrhea.  No constipation. Genitourinary: Negative for dysuria. Musculoskeletal: Bilateral foot pain left greater than right.  Skin: Negative for rash. Neurological: Negative for headaches, focal weakness or numbness. Psychiatric:Anxiety Endocrine:Hypertension  ____________________________________________   PHYSICAL EXAM:  VITAL SIGNS: ED Triage Vitals  Enc Vitals Group     BP 10/02/15 1323 121/91 mmHg     Pulse Rate 10/02/15 1323 97     Resp 10/02/15 1323 18     Temp 10/02/15 1323 98.5 F (36.9 C)     Temp Source 10/02/15 1323 Oral     SpO2 10/02/15 1323 100 %     Weight 10/02/15 1323 104 lb (47.174 kg)     Height 10/02/15 1323 5\' 3"  (1.6 m)     Head Cir --      Peak Flow --      Pain Score 10/02/15 1325 10     Pain Loc --      Pain Edu? --      Excl. in  [place]? --     Constitutional: Alert and oriented. Well appearing and in no acute distress. Eyes: Conjunctivae are normal. PERRL. EOMI. Head: Atraumatic. Nose: No congestion/rhinnorhea. Mouth/Throat: Mucous membranes are moist.  Oropharynx non-erythematous. Neck: No stridor.  No cervical spine tenderness to palpation. Cardiovascular: Normal rate, regular rhythm. Grossly normal heart sounds.  Good peripheral circulation. Respiratory: Normal respiratory effort.  No retractions. Lungs CTAB. Gastrointestinal: Soft and nontender. No distention. No abdominal bruits. No CVA tenderness. Musculoskeletal: No obvious edema or erythema or deformity to the bilateral foot. Patient has some moderate guarding palpation of the first second third and fifth phalanges.  Neurologic:  Normal speech and language. No gross focal  neurologic deficits are appreciated. No gait instability. Skin:  Skin is warm, dry and intact. No rash noted. Psychiatric: Mood and affect are normal. Speech and behavior are normal.  ____________________________________________   LABS (all labs ordered are listed, but only abnormal results are displayed)  Labs Reviewed  COMPREHENSIVE METABOLIC PANEL - Abnormal; Notable for the following:    Creatinine, Ser 1.05 (*)    Total Protein 8.8 (*)    Anion gap 4 (*)    All other components within normal limits  CBC WITH DIFFERENTIAL/PLATELET - Abnormal; Notable for the following:    RBC 3.39 (*)    Hemoglobin 11.1 (*)    HCT 32.6 (*)    All other components within normal limits  SEDIMENTATION RATE - Abnormal; Notable for the following:    Sed Rate 44 (*)    All other components within normal limits   ____________________________________________  EKG   ____________________________________________  RADIOLOGY   ____________________________________________   PROCEDURES  Procedure(s) performed: None  Critical Care performed: No  ____________________________________________   INITIAL IMPRESSION / ASSESSMENT AND PLAN / ED COURSE  Pertinent labs & imaging results that were available during my care of the patient were reviewed by me and considered in my medical decision making (see chart for details).  Arthralgia bilateral ankle and feet. Patient given discharge care instructions. Patient advised to follow-up with her family doctor for further evaluation and treatment. Patient given a work note. She given a prescription for Mobic and Percocets. ____________________________________________   FINAL CLINICAL IMPRESSION(S) / ED DIAGNOSES  Final diagnoses:  Arthralgia of ankle or foot, unspecified laterality      Sable Feil, PA-C 10/02/15 [place], MD 10/02/15 [phone]

## 2015-10-02 NOTE — Discharge Instructions (Signed)

## 2015-11-30 ENCOUNTER — Other Ambulatory Visit: Payer: Self-pay | Admitting: Family Medicine

## 2015-11-30 DIAGNOSIS — Z1231 Encounter for screening mammogram for malignant neoplasm of breast: Secondary | ICD-10-CM

## 2015-12-10 ENCOUNTER — Emergency Department: Payer: Self-pay

## 2015-12-10 ENCOUNTER — Emergency Department
Admission: EM | Admit: 2015-12-10 | Discharge: 2015-12-10 | Disposition: A | Discharge status: Home / Self Care | Payer: Self-pay | Attending: Emergency Medicine | Admitting: Emergency Medicine

## 2015-12-10 DIAGNOSIS — K5641 Fecal impaction: Secondary | ICD-10-CM | POA: Insufficient documentation

## 2015-12-10 DIAGNOSIS — Z87891 Personal history of nicotine dependence: Secondary | ICD-10-CM | POA: Insufficient documentation

## 2015-12-10 DIAGNOSIS — I10 Essential (primary) hypertension: Secondary | ICD-10-CM | POA: Insufficient documentation

## 2015-12-10 DIAGNOSIS — N390 Urinary tract infection, site not specified: Secondary | ICD-10-CM | POA: Insufficient documentation

## 2015-12-10 DIAGNOSIS — Z79899 Other long term (current) drug therapy: Secondary | ICD-10-CM | POA: Insufficient documentation

## 2015-12-10 LAB — URINALYSIS COMPLETE WITH MICROSCOPIC (ARMC ONLY)
Bilirubin Urine: NEGATIVE
Glucose, UA: NEGATIVE mg/dL
Hgb urine dipstick: NEGATIVE
Ketones, ur: NEGATIVE mg/dL
Nitrite: NEGATIVE
Protein, ur: NEGATIVE mg/dL
Specific Gravity, Urine: 1.026 (ref 1.005–1.030)
Squamous Epithelial / LPF: NONE SEEN
pH: 5 (ref 5.0–8.0)

## 2015-12-10 LAB — CBC
HCT: 38.3 % (ref 35.0–47.0)
Hemoglobin: 13.2 g/dL (ref 12.0–16.0)
MCH: 34.4 pg — ABNORMAL HIGH (ref 26.0–34.0)
MCHC: 34.4 g/dL (ref 32.0–36.0)
MCV: 99.9 fL (ref 80.0–100.0)
Platelets: 191 10*3/uL (ref 150–440)
RBC: 3.84 MIL/uL (ref 3.80–5.20)
RDW: 13.2 % (ref 11.5–14.5)
WBC: 17.6 10*3/uL — ABNORMAL HIGH (ref 3.6–11.0)

## 2015-12-10 LAB — BASIC METABOLIC PANEL
Anion gap: 10 (ref 5–15)
BUN: 29 mg/dL — ABNORMAL HIGH (ref 6–20)
CO2: 27 mmol/L (ref 22–32)
Calcium: 9.9 mg/dL (ref 8.9–10.3)
Chloride: 98 mmol/L — ABNORMAL LOW (ref 101–111)
Creatinine, Ser: 0.95 mg/dL (ref 0.44–1.00)
GFR calc Af Amer: >60 mL/min (ref >60)
GFR calc non Af Amer: >60 mL/min (ref >60)
Glucose, Bld: 91 mg/dL (ref 65–99)
Potassium: 3.6 mmol/L (ref 3.5–5.1)
Sodium: 135 mmol/L (ref 135–145)

## 2015-12-10 MED ORDER — ONDANSETRON HCL 4 MG/2ML IJ SOLN
4.0000 mg | Freq: Once | INTRAMUSCULAR | Status: AC
  Administered 2015-12-10: 4 mg via INTRAVENOUS

## 2015-12-10 MED ORDER — ONDANSETRON HCL 4 MG/2ML IJ SOLN
INTRAMUSCULAR | Status: AC
  Administered 2015-12-10: 4 mg via INTRAVENOUS
  Filled 2015-12-10: qty 2

## 2015-12-10 MED ORDER — CIPROFLOXACIN HCL 500 MG PO TABS: 500.0000 mg | ORAL_TABLET | Freq: Two times a day (BID) | ORAL | Status: AC

## 2015-12-10 MED ORDER — MORPHINE SULFATE (PF) 4 MG/ML IV SOLN
4.0000 mg | Freq: Once | INTRAVENOUS | Status: AC
  Administered 2015-12-10: 4 mg via INTRAVENOUS
  Filled 2015-12-10: qty 1

## 2015-12-10 MED ORDER — POLYETHYLENE GLYCOL 3350 17 G PO PACK: PACK | ORAL | Status: DC

## 2015-12-10 MED ORDER — KETOROLAC TROMETHAMINE 30 MG/ML IJ SOLN
30.0000 mg | Freq: Once | INTRAMUSCULAR | Status: AC
  Administered 2015-12-10: 30 mg via INTRAVENOUS
  Filled 2015-12-10: qty 1

## 2015-12-10 NOTE — Discharge Instructions (Signed)
Fecal Impaction A fecal impaction happens when there is a large, firm amount of stool (or feces) that cannot be passed. The impacted stool is usually in the rectum, which is the lowest part of the large bowel. The impacted stool can block the colon and cause significant problems. CAUSES  The longer stool stays in the rectum, the harder it gets. Anything that slows down your bowel movements can lead to fecal impaction, such as:  Constipation. This can be a long-standing (chronic) problem or can happen suddenly (acute).  Painful conditions of the rectum, such as hemorrhoids or anal fissures. The pain of these conditions can make you try to avoid having bowel movements.  Narcotic pain-relieving medicines, such as methadone, morphine, or codeine.  Not drinking enough fluids.  Inactivity and bed rest over long periods of time.  Diseases of the brain or nervous system that damage the nerves controlling the muscles of the intestines. SIGNS AND SYMPTOMS   Lack of normal bowel movements or changes in bowel patterns.  Sense of fullness in the rectum but unable to pass stool.  Pain or cramps in the abdominal area (often after meals).  Thin, watery discharge from the rectum. DIAGNOSIS  Your health care provider may suspect that you have a fecal impaction based on your symptoms and a physical exam. This will include an exam of your rectum. Sometimes X-rays or lab testing may be needed to confirm the diagnosis and to be sure there are no other problems.  TREATMENT   Initially an impaction can be removed manually. Using a gloved finger, your health care provider can remove hard stool from your rectum.  Medicine is sometimes needed. A suppository or enema can be given in the rectum to soften the stool, which can stimulate a bowel movement. Medicines can also be given by mouth (orally).  Though rare, surgery may be needed if the colon has torn (perforated) due to blockage. HOME CARE INSTRUCTIONS    Develop regular bowel habits. This could include getting in the habit of having a bowel movement after your morning cup of coffee or after eating. Be sure to allow yourself enough time on the toilet.  Maintain a high-fiber diet.  Drink enough fluids to keep your urine clear or pale yellow as directed by your health care provider.  Exercise regularly.  If you begin to get constipated, increase the amount of fiber in your diet. Eat plenty of fruits, vegetables, whole wheat breads, bran, oatmeal, and similar products.  Take natural fiber laxatives or other laxatives only as directed by your health care provider. SEEK MEDICAL CARE IF:   You have ongoing rectal pain.  You require enemas or suppositories more than twice a week.  You have rectal bleeding.  You have continued problems, or you develop abdominal pain.  You have thin, pencil-like stools. SEEK IMMEDIATE MEDICAL CARE IF:  You have black or tarry stools. MAKE SURE YOU:   Understand these instructions.  Will watch your condition.  Will get help right away if you are not doing well or get worse.   This information is not intended to replace advice given to you by your health care provider. Make sure you discuss any questions you have with your health care provider.   Document Released: 02/26/2004 Document Revised: 03/26/2013 Document Reviewed: 12/10/2012 Elsevier Interactive Patient Education 2016 [employer]  Urinary Tract Infection Urinary tract infections (UTIs) can develop anywhere along your urinary tract. Your urinary tract is your body's drainage system for removing wastes  and extra water. Your urinary tract includes two kidneys, two ureters, a bladder, and a urethra. Your kidneys are a pair of bean-shaped organs. Each kidney is about the size of your fist. They are located below your ribs, one on each side of your spine. CAUSES Infections are caused by microbes, which are microscopic organisms, including fungi,  viruses, and bacteria. These organisms are so small that they can only be seen through a microscope. Bacteria are the microbes that most commonly cause UTIs. SYMPTOMS  Symptoms of UTIs may vary by age and gender of the patient and by the location of the infection. Symptoms in young women typically include a frequent and intense urge to urinate and a painful, burning feeling in the bladder or urethra during urination. Older women and men are more likely to be tired, shaky, and weak and have muscle aches and abdominal pain. A fever may mean the infection is in your kidneys. Other symptoms of a kidney infection include pain in your back or sides below the ribs, nausea, and vomiting. DIAGNOSIS To diagnose a UTI, your caregiver will ask you about your symptoms. Your caregiver will also ask you to provide a urine sample. The urine sample will be tested for bacteria and white blood cells. White blood cells are made by your body to help fight infection. TREATMENT  Typically, UTIs can be treated with medication. Because most UTIs are caused by a bacterial infection, they usually can be treated with the use of antibiotics. The choice of antibiotic and length of treatment depend on your symptoms and the type of bacteria causing your infection. HOME CARE INSTRUCTIONS  If you were prescribed antibiotics, take them exactly as your caregiver instructs you. Finish the medication even if you feel better after you have only taken some of the medication.  Drink enough water and fluids to keep your urine clear or pale yellow.  Avoid caffeine, tea, and carbonated beverages. They tend to irritate your bladder.  Empty your bladder often. Avoid holding urine for long periods of time.  Empty your bladder before and after sexual intercourse.  After a bowel movement, women should cleanse from front to back. Use each tissue only once. SEEK MEDICAL CARE IF:   You have back pain.  You develop a fever.  Your symptoms do  not begin to resolve within 3 days. SEEK IMMEDIATE MEDICAL CARE IF:   You have severe back pain or lower abdominal pain.  You develop chills.  You have nausea or vomiting.  You have continued burning or discomfort with urination. MAKE SURE YOU:   Understand these instructions.  Will watch your condition.  Will get help right away if you are not doing well or get worse.   This information is not intended to replace advice given to you by your health care provider. Make sure you discuss any questions you have with your health care provider.   Document Released: 03/15/2005 Document Revised: 02/24/2015 Document Reviewed: 07/14/2011 Elsevier Interactive Patient Education [employer].

## 2015-12-10 NOTE — ED Notes (Signed)
Pt transported to CT ?

## 2015-12-10 NOTE — ED Provider Notes (Signed)
[facility] Emergency Department Provider Note        Time seen: ----------------------------------------- 9:29 AM on 12/10/2015 -----------------------------------------    I have reviewed the triage vital signs and the nursing notes.   HISTORY  Chief Complaint Flank Pain    HPI Valerie Bell is a 50 y.o. female who presents to ER for left flank pain that began yesterday. She's never had this happen before, denies any recent injury or trauma. She states is dull, nothing makes it better or worse.   Past Medical History  Diagnosis Date  . Anxiety   . Hypertension     There are no active problems to display for this patient.   Past Surgical History  Procedure Laterality Date  . Abdominal hysterectomy    . Breast biopsy Right     negative    Allergies Review of patient's allergies indicates no known allergies.  Social History Social History  Substance Use Topics  . Smoking status: Former Smoker -- 0.30 packs/day for 4 years    Types: Cigarettes    Quit date: 12/30/2003  . Smokeless tobacco: None  . Alcohol Use: No    Review of Systems Constitutional: Negative for fever. Cardiovascular: Negative for chest pain. Respiratory: Negative for shortness of breath. Gastrointestinal:Positive for flank pain Genitourinary: Negative for dysuria. Musculoskeletal: Negative for back pain. Skin: Negative for rash. Neurological: Negative for headaches, focal weakness or numbness.  10-point ROS otherwise negative.  ____________________________________________   PHYSICAL EXAM:  VITAL SIGNS: ED Triage Vitals  Enc Vitals Group     BP 12/10/15 0916 104/84 mmHg     Pulse Rate 12/10/15 0916 80     Resp 12/10/15 0916 16     Temp --      Temp src --      SpO2 12/10/15 0916 100 %     Weight --      Height --      Head Cir --      Peak Flow --      Pain Score 12/10/15 0902 10     Pain Loc --      Pain Edu? --      Excl. in [place]? --      Constitutional: Alert and oriented. Mild distress Eyes: Conjunctivae are normal. Normal extraocular movements. ENT   Head: Normocephalic and atraumatic.   Nose: No congestion/rhinnorhea.   Mouth/Throat: Mucous membranes are moist.   Neck: No stridor. Cardiovascular: Normal rate, regular rhythm. No murmurs, rubs, or gallops. Respiratory: Normal respiratory effort without tachypnea nor retractions. Breath sounds are clear and equal bilaterally. No wheezes/rales/rhonchi. Gastrointestinal: Left flank tenderness, no rebound or guarding. Normal bowel sounds. CVA appears normal to percussion Musculoskeletal: Nontender with normal range of motion in all extremities. No lower extremity tenderness nor edema. Neurologic:  Normal speech and language. No gross focal neurologic deficits are appreciated.  Skin:  Skin is warm, dry and intact. No rash noted. ____________________________________________  ED COURSE:  Pertinent labs & imaging results that were available during my care of the patient were reviewed by me and considered in my medical decision making (see chart for details). Patient is no acute distress, will check basic labs and urine. Consider CT imaging for possible renal colic ____________________________________________    LABS (pertinent positives/negatives)  Labs Reviewed  URINALYSIS COMPLETEWITH MICROSCOPIC ([facility] ONLY) - Abnormal; Notable for the following:    Color, Urine YELLOW (*)    APPearance CLEAR (*)    Leukocytes, UA 1+ (*)  Bacteria, UA MANY (*)    All other components within normal limits  CBC - Abnormal; Notable for the following:    WBC 17.6 (*)    MCH 34.4 (*)    All other components within normal limits  BASIC METABOLIC PANEL - Abnormal; Notable for the following:    Chloride 98 (*)    BUN 29 (*)    All other components within normal limits    RADIOLOGY Images were viewed by me  CT renal protocol IMPRESSION: Constipation with fecal  impaction in the rectum. No evidence of obstructive nephropathy. ____________________________________________  FINAL ASSESSMENT AND PLAN  Flank pain, Constipation, UTI  Plan: Patient with labs and imaging as dictated above. Patient is in no acute distress, pain seems to be from fecal impaction. I did offer disimpaction and enema but she has declined at this time. She'll be placed on MiraLAX and will be advised to drink the whole bottle today. She will also be on Cipro for UTI. She stable for outpatient follow-up.   Earleen Newport, MD   Note: This dictation was prepared with Dragon dictation. Any transcriptional errors that result from this process are unintentional   Earleen Newport, MD 12/10/15 1055

## 2015-12-10 NOTE — ED Notes (Signed)
Pt arrives to ER via POV c/o left sided flank pain that began yesterday. No hx of similar, denies injury. Pt ambulatory. Pt alert and oriented X4, active, cooperative, pt in NAD. RR even and unlabored, color WNL.

## 2015-12-15 ENCOUNTER — Ambulatory Visit: Payer: Self-pay

## 2016-01-12 ENCOUNTER — Ambulatory Visit: Payer: Self-pay | Attending: Internal Medicine

## 2016-01-12 ENCOUNTER — Ambulatory Visit
Admission: RE | Admit: 2016-01-12 | Discharge: 2016-01-12 | Disposition: A | Discharge status: Home / Self Care | Payer: Self-pay | Source: Ambulatory Visit | Attending: Internal Medicine | Admitting: Internal Medicine

## 2016-01-12 DIAGNOSIS — Z0000 Encounter for general adult medical examination without abnormal findings: Secondary | ICD-10-CM

## 2016-01-12 DIAGNOSIS — N63 Unspecified lump in unspecified breast: Secondary | ICD-10-CM

## 2016-01-12 NOTE — Progress Notes (Signed)
Subjective:     Patient ID: Valerie Bell, female   DOB: [birth year], 50 y.o.   MRN: [id number]  HPI   Review of Systems     Objective:   Physical Exam  Pulmonary/Chest: Right breast exhibits no inverted nipple, no mass, no nipple discharge, no skin change and no tenderness. Left breast exhibits no inverted nipple, no mass, no nipple discharge, no skin change and no tenderness. Breasts are asymmetrical.    Right breast larger than left;  Nodularity more prominent       Assessment:     50 [facility] [place] [facility] visitPatient screened, and meets [facility] eligibility.  Patient does not have insurance, Medicare or Medicaid.  Handout given on Affordable Care Act. Instructed patient on breast self-exam using teach back method.  CBE reveals bilateral thickening upper outer quadrant.  Right breast larger than left.  These findings were noted on last exam.  At that time patient had a diagnostic mammogrma, and  Ultrasound with negative results.    Plan:     Sent for bilateral screening mammogram.

## 2016-02-02 NOTE — Progress Notes (Signed)
Letter mailed from [facility] to notify of normal mammogram results.  Patient to return in one year for annual screening.  Copy to [facility]. 

## 2016-12-12 ENCOUNTER — Encounter: Payer: Self-pay | Admitting: *Deleted

## 2016-12-12 ENCOUNTER — Emergency Department
Admission: EM | Admit: 2016-12-12 | Discharge: 2016-12-12 | Disposition: A | Discharge status: Home / Self Care | Payer: Self-pay | Attending: Emergency Medicine | Admitting: Emergency Medicine

## 2016-12-12 DIAGNOSIS — X509XXA Other and unspecified overexertion or strenuous movements or postures, initial encounter: Secondary | ICD-10-CM | POA: Insufficient documentation

## 2016-12-12 DIAGNOSIS — Z87891 Personal history of nicotine dependence: Secondary | ICD-10-CM | POA: Insufficient documentation

## 2016-12-12 DIAGNOSIS — S39012A Strain of muscle, fascia and tendon of lower back, initial encounter: Secondary | ICD-10-CM | POA: Insufficient documentation

## 2016-12-12 DIAGNOSIS — Z791 Long term (current) use of non-steroidal anti-inflammatories (NSAID): Secondary | ICD-10-CM | POA: Insufficient documentation

## 2016-12-12 DIAGNOSIS — I10 Essential (primary) hypertension: Secondary | ICD-10-CM | POA: Insufficient documentation

## 2016-12-12 DIAGNOSIS — Y990 Civilian activity done for income or pay: Secondary | ICD-10-CM | POA: Insufficient documentation

## 2016-12-12 DIAGNOSIS — Y939 Activity, unspecified: Secondary | ICD-10-CM | POA: Insufficient documentation

## 2016-12-12 DIAGNOSIS — Y929 Unspecified place or not applicable: Secondary | ICD-10-CM | POA: Insufficient documentation

## 2016-12-12 DIAGNOSIS — Z760 Encounter for issue of repeat prescription: Secondary | ICD-10-CM | POA: Insufficient documentation

## 2016-12-12 MED ORDER — HYDRALAZINE HCL 25 MG PO TABS: 25.0000 mg | ORAL_TABLET | Freq: Two times a day (BID) | ORAL | 11 refills | Status: DC

## 2016-12-12 MED ORDER — CYCLOBENZAPRINE HCL 10 MG PO TABS: 10.0000 mg | ORAL_TABLET | Freq: Three times a day (TID) | ORAL | 0 refills | Status: DC | PRN

## 2016-12-12 MED ORDER — SPIRONOLACTONE 25 MG PO TABS: 25.0000 mg | ORAL_TABLET | Freq: Two times a day (BID) | ORAL | 11 refills | Status: DC

## 2016-12-12 MED ORDER — LISINOPRIL 40 MG PO TABS: 40.0000 mg | ORAL_TABLET | Freq: Every day | ORAL | 11 refills | Status: DC

## 2016-12-12 MED ORDER — TRAMADOL HCL 50 MG PO TABS: 50.0000 mg | ORAL_TABLET | Freq: Four times a day (QID) | ORAL | 0 refills | Status: AC | PRN

## 2016-12-12 NOTE — ED Provider Notes (Signed)
[facility] Emergency Department Provider Note   ____________________________________________   First MD Initiated Contact with Patient 12/12/16 1724     (approximate)  I have reviewed the triage vital signs and the nursing notes.   HISTORY  Chief Complaint Back Pain    HPI Valerie Bell is a 51 y.o. female patient complaining of low back pain for 2 days. Patient state she started new job which requires repetitive lifting and bending. Patient denies any radicular component to her back pain. Patient denies any urinary complaints. Patient states elevated blood pressure secondary to being out of medications. Patient denies headache, visual disturbances or vertigo. Patient recently has obtained insurance but does not have a PCP. Patient states she like to reestablish care with previous PCP and will try to make an appointment next week.Patient rates her back pain as a 10 over 10. Patient stated this pain and spasms.  Past Medical History:  Diagnosis Date  . Anxiety   . Hypertension     There are no active problems to display for this patient.   Past Surgical History:  Procedure Laterality Date  . ABDOMINAL HYSTERECTOMY    . BREAST BIOPSY Right 17+ years ago   negative Core Bx    Prior to Admission medications   Medication Sig Start Date End Date Taking? Authorizing Provider  cyclobenzaprine (FLEXERIL) 10 MG tablet Take 1 tablet (10 mg total) by mouth 3 (three) times daily as needed. 12/12/16   Sable Feil, PA-C  diazepam (VALIUM) 5 MG tablet Take 1 tablet (5 mg total) by mouth every 8 (eight) hours as needed for muscle spasms. 11/11/14   Earleen Newport, MD  hydrALAZINE (APRESOLINE) 25 MG tablet Take 1 tablet (25 mg total) by mouth 2 (two) times daily with a meal. 12/12/16 12/12/17  Sable Feil, PA-C  lisinopril (PRINIVIL,ZESTRIL) 40 MG tablet Take 1 tablet (40 mg total) by mouth daily. 12/12/16   Sable Feil, PA-C  meloxicam (MOBIC) 15 MG  tablet Take 1 tablet (15 mg total) by mouth daily. 10/02/15   Sable Feil, PA-C  polyethylene glycol [facility] / Floria Raveling) packet Drink the whole bottle today for constipation 12/10/15   Earleen Newport, MD  potassium chloride (K-DUR) 10 MEQ tablet Take 1 tablet (10 mEq total) by mouth daily. 11/30/14   Delman Kitten, MD  spironolactone (ALDACTONE) 25 MG tablet Take 1 tablet (25 mg total) by mouth 2 (two) times daily. 12/12/16 12/12/17  Sable Feil, PA-C  traMADol (ULTRAM) 50 MG tablet Take 1 tablet (50 mg total) by mouth every 6 (six) hours as needed. 12/12/16 12/12/17  Sable Feil, PA-C    Allergies Patient has no known allergies.  Family History  Problem Relation Age of Onset  . Breast cancer Neg Hx     Social History Social History  Substance Use Topics  . Smoking status: Former Smoker    Packs/day: 0.30    Years: 4.00    Types: Cigarettes    Quit date: 12/30/2003  . Smokeless tobacco: Never Used  . Alcohol use No    Review of Systems  Constitutional: No fever/chills Eyes: No visual changes. ENT: No sore throat. Cardiovascular: Denies chest pain. Respiratory: Denies shortness of breath. Gastrointestinal: No abdominal pain.  No nausea, no vomiting.  No diarrhea.  No constipation. Genitourinary: Negative for dysuria. Musculoskeletal: Positive for back pain. Skin: Negative for rash. Neurological: Negative for headaches, focal weakness or numbness. Psychiatric:Anxiety Endocrine:Hypertension  ____________________________________________   PHYSICAL  EXAM:  VITAL SIGNS: ED Triage Vitals  Enc Vitals Group     BP 12/12/16 1633 (!) 173/110     Pulse Rate 12/12/16 1633 71     Resp 12/12/16 1633 18     Temp 12/12/16 1633 98.7 F (37.1 C)     Temp Source 12/12/16 1633 Oral     SpO2 12/12/16 1633 100 %     Weight 12/12/16 1630 101 lb (45.8 kg)     Height 12/12/16 1630 5\' 3"  (1.6 m)     Head Circumference --      Peak Flow --      Pain Score 12/12/16 1630 10       Pain Loc --      Pain Edu? --      Excl. in [place]? --     Constitutional: Alert and oriented. Well appearing and in no acute distress. Cardiovascular: Normal rate, regular rhythm. Grossly normal heart sounds.  Good peripheral circulation.Elevated blood pressure Respiratory: Normal respiratory effort.  No retractions. Lungs CTAB. Gastrointestinal: Soft and nontender. No distention. No abdominal bruits. No CVA tenderness. Musculoskeletal: Patient's is in stable heavy reliance on upper extremity. Patient moving hesitant manner. No obvious spinal deformity. Patient has no guarding palpation spinal processes. Patient has moderate guarding palpation left paraspinal muscle area. Increased muscle spasm with right lateral movements. Patient has negative straight leg test. Neurologic:  Normal speech and language. No gross focal neurologic deficits are appreciated. No gait instability. Skin:  Skin is warm, dry and intact. No rash noted. Psychiatric: Mood and affect are normal. Speech and behavior are normal.  ____________________________________________   LABS (all labs ordered are listed, but only abnormal results are displayed)  Labs Reviewed - No data to display ____________________________________________  EKG   ____________________________________________  RADIOLOGY  No results found.  ____________________________________________   PROCEDURES  Procedure(s) performed: None  Procedures  Critical Care performed: No  ____________________________________________   INITIAL IMPRESSION / ASSESSMENT AND PLAN / ED COURSE  Pertinent labs & imaging results that were available during my care of the patient were reviewed by me and considered in my medical decision making (see chart for details).  Lumbar strain and hypertension. Patient given discharge care instructions. Patient given a work note. Patient advised to reestablish care with PCP.       ____________________________________________   FINAL CLINICAL IMPRESSION(S) / ED DIAGNOSES  Final diagnoses:  Strain of lumbar region, initial encounter  Essential hypertension  Encounter for medication refill      NEW MEDICATIONS STARTED DURING THIS VISIT:  New Prescriptions   CYCLOBENZAPRINE (FLEXERIL) 10 MG TABLET    Take 1 tablet (10 mg total) by mouth 3 (three) times daily as needed.   HYDRALAZINE (APRESOLINE) 25 MG TABLET    Take 1 tablet (25 mg total) by mouth 2 (two) times daily with a meal.   LISINOPRIL (PRINIVIL,ZESTRIL) 40 MG TABLET    Take 1 tablet (40 mg total) by mouth daily.   SPIRONOLACTONE (ALDACTONE) 25 MG TABLET    Take 1 tablet (25 mg total) by mouth 2 (two) times daily.   TRAMADOL (ULTRAM) 50 MG TABLET    Take 1 tablet (50 mg total) by mouth every 6 (six) hours as needed.     Note:  This document was prepared using Dragon voice recognition software and may include unintentional dictation errors.    Sable Feil, PA-C 12/12/16 1747    Hinda Kehr, MD 12/12/16 2007

## 2016-12-12 NOTE — ED Notes (Signed)
See triage note.  Having lower back pain for couple of days   W/o injury   Ambulates well to treatment room also out of meds for her blood pressure,

## 2016-12-12 NOTE — ED Triage Notes (Addendum)
Pt has low back pain for 2 days.   No known injury.  No urinary sx.  Pt alert.  Out of blood pressure meds for 1 year.

## 2017-01-10 ENCOUNTER — Ambulatory Visit: Payer: Self-pay | Attending: Oncology | Admitting: *Deleted

## 2017-01-10 ENCOUNTER — Ambulatory Visit
Admission: RE | Admit: 2017-01-10 | Discharge: 2017-01-10 | Disposition: A | Discharge status: Home / Self Care | Payer: Self-pay | Source: Ambulatory Visit | Attending: Oncology | Admitting: Oncology

## 2017-01-10 ENCOUNTER — Encounter: Payer: Self-pay | Admitting: *Deleted

## 2017-01-10 VITALS — BP 117/84 | HR 76 | Temp 98.7°F | Ht 63.0 in | Wt 97.0 lb

## 2017-01-10 DIAGNOSIS — Z0000 Encounter for general adult medical examination without abnormal findings: Secondary | ICD-10-CM

## 2017-01-10 NOTE — Patient Instructions (Signed)
Gave patient hand-out, Women Staying Healthy, Active and Well from [facility], with education on breast health, pap smears, heart and colon health. 

## 2017-01-10 NOTE — Progress Notes (Signed)
Subjective:     Patient ID: Valerie Bell, female   DOB: May 28, 1966, 51 y.o.   MRN: [id number]  HPI   Review of Systems     Objective:   Physical Exam  Pulmonary/Chest: Right breast exhibits inverted nipple. Right breast exhibits no mass, no nipple discharge, no skin change and no tenderness. Left breast exhibits inverted nipple. Left breast exhibits no mass, no nipple discharge, no skin change and no tenderness. Breasts are symmetrical.    Abdominal: There is no splenomegaly or hepatomegaly.    Genitourinary: No labial fusion. There is no rash, tenderness, lesion or injury on the right labia. There is no rash, tenderness, lesion or injury on the left labia. No erythema, tenderness or bleeding in the vagina. No foreign body in the vagina. No signs of injury around the vagina. No vaginal discharge found.  Skin: Bruising noted. There is cyanosis.          Assessment:     51 year old Black female returns to [facility] for annual screening.  She has lost approximately 8 lbs since her last exam.  States she has been diagnosed with Lupus.  Patient's face, trunk and extremities are covered with a reddish bruising like rash.  Patient complains of left toe pain.  States she went to the ED about the pain and they said she had arthritis.  The patient has bilateral equal pedal pulses.  The left big toe is cyanotic.  The patient also states she has pulmonary nodules the pulmonologist is following.  Discussed with the patient that maybe the lung nodules and the blue painful toe, is possibly related to the Lupus.  States she is not being treated at this time for the Lupus.  She is only seeing her primary care provider.    Clinical breast exam unremarkable.  Taught self breast awareness.  The patient has had a hysterectomy, but has one ovary.  Pelvic exam without masses or lesions.  Patient has been screened for eligibility.  She does not have any insurance, Medicare or Medicaid.  She also meets financial  eligibility.  Hand-out given on the Affordable Care Act.          Plan:     Screening mammogram ordered.  Reviewed again need for a physician specializing in Lupus.  Number given to the Rheumatology (Lupus) [facility] in [place].  Encouraged her to  discuss the lung nodules with the Lupus physician and to inform her pulmonologist of her diagnosis of Lupus.  Will follow-up per [facility] protocol.

## 2017-01-12 ENCOUNTER — Encounter: Payer: Self-pay | Admitting: *Deleted

## 2017-01-12 NOTE — Progress Notes (Signed)
Letter mailed from the [facility] to inform patient of her normal mammogram results.  Patient is to follow-up with annual screening in one year.  HSIS to [facility]. 

## 2017-03-30 ENCOUNTER — Emergency Department: Payer: Medicaid Other

## 2017-03-30 ENCOUNTER — Observation Stay
Admission: EM | Admit: 2017-03-30 | Discharge: 2017-03-31 | Disposition: A | Discharge status: Home / Self Care | Payer: Medicaid Other | Attending: Internal Medicine | Admitting: Internal Medicine

## 2017-03-30 ENCOUNTER — Other Ambulatory Visit: Payer: Self-pay | Admitting: Radiology

## 2017-03-30 DIAGNOSIS — N281 Cyst of kidney, acquired: Secondary | ICD-10-CM | POA: Insufficient documentation

## 2017-03-30 DIAGNOSIS — M359 Systemic involvement of connective tissue, unspecified: Secondary | ICD-10-CM | POA: Diagnosis not present

## 2017-03-30 DIAGNOSIS — R112 Nausea with vomiting, unspecified: Secondary | ICD-10-CM | POA: Diagnosis not present

## 2017-03-30 DIAGNOSIS — I513 Intracardiac thrombosis, not elsewhere classified: Secondary | ICD-10-CM | POA: Insufficient documentation

## 2017-03-30 DIAGNOSIS — M329 Systemic lupus erythematosus, unspecified: Secondary | ICD-10-CM | POA: Diagnosis not present

## 2017-03-30 DIAGNOSIS — R9389 Abnormal findings on diagnostic imaging of other specified body structures: Secondary | ICD-10-CM

## 2017-03-30 DIAGNOSIS — I10 Essential (primary) hypertension: Secondary | ICD-10-CM | POA: Diagnosis not present

## 2017-03-30 DIAGNOSIS — R1013 Epigastric pain: Secondary | ICD-10-CM | POA: Insufficient documentation

## 2017-03-30 DIAGNOSIS — Z79899 Other long term (current) drug therapy: Secondary | ICD-10-CM | POA: Diagnosis not present

## 2017-03-30 DIAGNOSIS — I7410 Embolism and thrombosis of unspecified parts of aorta: Secondary | ICD-10-CM

## 2017-03-30 DIAGNOSIS — R079 Chest pain, unspecified: Secondary | ICD-10-CM | POA: Diagnosis not present

## 2017-03-30 DIAGNOSIS — F419 Anxiety disorder, unspecified: Secondary | ICD-10-CM | POA: Diagnosis not present

## 2017-03-30 DIAGNOSIS — Z87891 Personal history of nicotine dependence: Secondary | ICD-10-CM | POA: Insufficient documentation

## 2017-03-30 HISTORY — DX: Systemic involvement of connective tissue, unspecified: M35.9

## 2017-03-30 LAB — COMPREHENSIVE METABOLIC PANEL
ALT: 10 U/L — ABNORMAL LOW (ref 14–54)
AST: 20 U/L (ref 15–41)
Albumin: 4.4 g/dL (ref 3.5–5.0)
Alkaline Phosphatase: 57 U/L (ref 38–126)
Anion gap: 9 (ref 5–15)
BUN: 19 mg/dL (ref 6–20)
CO2: 25 mmol/L (ref 22–32)
Calcium: 9.5 mg/dL (ref 8.9–10.3)
Chloride: 104 mmol/L (ref 101–111)
Creatinine, Ser: 0.98 mg/dL (ref 0.44–1.00)
GFR calc Af Amer: >60 mL/min (ref >60)
GFR calc non Af Amer: >60 mL/min (ref >60)
Glucose, Bld: 96 mg/dL (ref 65–99)
Potassium: 3.8 mmol/L (ref 3.5–5.1)
Sodium: 138 mmol/L (ref 135–145)
Total Bilirubin: 0.5 mg/dL (ref 0.3–1.2)
Total Protein: 9 g/dL — ABNORMAL HIGH (ref 6.5–8.1)

## 2017-03-30 LAB — CBC
HCT: 36.7 % (ref 35.0–47.0)
Hemoglobin: 12.8 g/dL (ref 12.0–16.0)
MCH: 34.3 pg — ABNORMAL HIGH (ref 26.0–34.0)
MCHC: 34.9 g/dL (ref 32.0–36.0)
MCV: 98.3 fL (ref 80.0–100.0)
Platelets: 158 10*3/uL (ref 150–440)
RBC: 3.74 MIL/uL — ABNORMAL LOW (ref 3.80–5.20)
RDW: 13 % (ref 11.5–14.5)
WBC: 7.3 10*3/uL (ref 3.6–11.0)

## 2017-03-30 LAB — TROPONIN I
Troponin I: <0.03 ng/mL (ref <0.03)
Troponin I: <0.03 ng/mL (ref <0.03)
Troponin I: <0.03 ng/mL (ref <0.03)
Troponin I: <0.03 ng/mL (ref <0.03)

## 2017-03-30 MED ORDER — HYDROXYCHLOROQUINE SULFATE 200 MG PO TABS
100.0000 mg | ORAL_TABLET | Freq: Every day | ORAL | Status: DC
  Filled 2017-03-30: qty 0.5

## 2017-03-30 MED ORDER — ONDANSETRON HCL 4 MG/2ML IJ SOLN
INTRAMUSCULAR | Status: AC
  Filled 2017-03-30: qty 2

## 2017-03-30 MED ORDER — ONDANSETRON HCL 4 MG/2ML IJ SOLN
4.0000 mg | Freq: Once | INTRAMUSCULAR | Status: AC
  Administered 2017-03-30 (×2): 4 mg via INTRAVENOUS

## 2017-03-30 MED ORDER — MORPHINE SULFATE (PF) 2 MG/ML IV SOLN: 2.0000 mg | Freq: Once | INTRAVENOUS | Status: DC

## 2017-03-30 MED ORDER — TRIAMCINOLONE ACETONIDE 0.1 % EX OINT
1.0000 "application " | TOPICAL_OINTMENT | Freq: Two times a day (BID) | CUTANEOUS | Status: DC
  Administered 2017-03-30 (×2): 1 via TOPICAL
  Filled 2017-03-30: qty 15

## 2017-03-30 MED ORDER — HYDROXYCHLOROQUINE SULFATE 200 MG PO TABS
200.0000 mg | ORAL_TABLET | Freq: Every day | ORAL | Status: DC
  Administered 2017-03-31: 200 mg via ORAL
  Filled 2017-03-30: qty 1

## 2017-03-30 MED ORDER — NITROGLYCERIN 0.4 MG SL SUBL
0.4000 mg | SUBLINGUAL_TABLET | SUBLINGUAL | Status: DC | PRN
  Administered 2017-03-30 (×3): 0.4 mg via SUBLINGUAL
  Filled 2017-03-30 (×2): qty 1

## 2017-03-30 MED ORDER — ACETAMINOPHEN 650 MG RE SUPP: 650.0000 mg | Freq: Four times a day (QID) | RECTAL | Status: DC | PRN

## 2017-03-30 MED ORDER — ONDANSETRON HCL 4 MG PO TABS: 4.0000 mg | ORAL_TABLET | Freq: Four times a day (QID) | ORAL | Status: DC | PRN

## 2017-03-30 MED ORDER — ASPIRIN EC 81 MG PO TBEC
81.0000 mg | DELAYED_RELEASE_TABLET | Freq: Every day | ORAL | Status: DC
  Administered 2017-03-30 – 2017-03-31 (×2): 81 mg via ORAL
  Filled 2017-03-30 (×3): qty 1

## 2017-03-30 MED ORDER — HYDROXYCHLOROQUINE SULFATE 200 MG PO TABS
100.0000 mg | ORAL_TABLET | Freq: Every day | ORAL | Status: DC
  Administered 2017-03-30: 100 mg via ORAL
  Filled 2017-03-30 (×2): qty 0.5

## 2017-03-30 MED ORDER — TRAMADOL HCL 50 MG PO TABS: 50.0000 mg | ORAL_TABLET | Freq: Four times a day (QID) | ORAL | Status: DC | PRN

## 2017-03-30 MED ORDER — ONDANSETRON HCL 4 MG/2ML IJ SOLN
INTRAMUSCULAR | Status: AC
Start: 2017-03-30 — End: 2017-03-30
  Administered 2017-03-30: 4 mg via INTRAVENOUS
  Filled 2017-03-30: qty 2

## 2017-03-30 MED ORDER — LISINOPRIL 20 MG PO TABS
40.0000 mg | ORAL_TABLET | Freq: Every day | ORAL | Status: DC
  Administered 2017-03-31: 40 mg via ORAL
  Filled 2017-03-30: qty 2

## 2017-03-30 MED ORDER — SPIRONOLACTONE 25 MG PO TABS
25.0000 mg | ORAL_TABLET | Freq: Two times a day (BID) | ORAL | Status: DC
  Administered 2017-03-30 – 2017-03-31 (×2): 25 mg via ORAL
  Filled 2017-03-30 (×2): qty 1

## 2017-03-30 MED ORDER — IOPAMIDOL (ISOVUE-370) INJECTION 76%
75.0000 mL | Freq: Once | INTRAVENOUS | Status: AC | PRN
  Administered 2017-03-30: 75 mL via INTRAVENOUS

## 2017-03-30 MED ORDER — MORPHINE SULFATE (PF) 4 MG/ML IV SOLN
4.0000 mg | Freq: Once | INTRAVENOUS | Status: AC
  Administered 2017-03-30: 4 mg via INTRAVENOUS

## 2017-03-30 MED ORDER — METOCLOPRAMIDE HCL 5 MG/ML IJ SOLN
10.0000 mg | Freq: Once | INTRAMUSCULAR | Status: AC
  Administered 2017-03-30: 10 mg via INTRAVENOUS
  Filled 2017-03-30: qty 2

## 2017-03-30 MED ORDER — ACETAMINOPHEN 325 MG PO TABS: 650.0000 mg | ORAL_TABLET | Freq: Four times a day (QID) | ORAL | Status: DC | PRN

## 2017-03-30 MED ORDER — HYDROXYCHLOROQUINE SULFATE 200 MG PO TABS
100.0000 mg | ORAL_TABLET | Freq: Two times a day (BID) | ORAL | Status: DC
  Filled 2017-03-30 (×2): qty 1

## 2017-03-30 MED ORDER — ONDANSETRON HCL 4 MG/2ML IJ SOLN
4.0000 mg | Freq: Four times a day (QID) | INTRAMUSCULAR | Status: DC | PRN
  Administered 2017-03-30: 4 mg via INTRAVENOUS
  Filled 2017-03-30: qty 2

## 2017-03-30 MED ORDER — MOMETASONE FUROATE 0.1 % EX CREA
1.0000 "application " | TOPICAL_CREAM | Freq: Every day | CUTANEOUS | Status: DC
  Filled 2017-03-30: qty 15

## 2017-03-30 MED ORDER — HYDROXYCHLOROQUINE SULFATE 200 MG PO TABS
200.0000 mg | ORAL_TABLET | Freq: Every day | ORAL | Status: DC
  Filled 2017-03-30: qty 1

## 2017-03-30 MED ORDER — KETOROLAC TROMETHAMINE 30 MG/ML IJ SOLN
30.0000 mg | Freq: Four times a day (QID) | INTRAMUSCULAR | Status: DC | PRN
  Administered 2017-03-30: 30 mg via INTRAVENOUS
  Filled 2017-03-30: qty 1

## 2017-03-30 MED ORDER — MORPHINE SULFATE (PF) 4 MG/ML IV SOLN
INTRAVENOUS | Status: AC
  Administered 2017-03-30: 2 mg
  Filled 2017-03-30: qty 1

## 2017-03-30 MED ORDER — ENSURE ENLIVE PO LIQD: 237.0000 mL | Freq: Three times a day (TID) | ORAL | Status: DC

## 2017-03-30 MED ORDER — ENOXAPARIN SODIUM 40 MG/0.4ML ~~LOC~~ SOLN
40.0000 mg | SUBCUTANEOUS | Status: DC
  Administered 2017-03-30: 40 mg via SUBCUTANEOUS
  Filled 2017-03-30: qty 0.4

## 2017-03-30 MED ORDER — HYDRALAZINE HCL 25 MG PO TABS
25.0000 mg | ORAL_TABLET | Freq: Two times a day (BID) | ORAL | Status: DC
  Administered 2017-03-30 – 2017-03-31 (×2): 25 mg via ORAL
  Filled 2017-03-30 (×3): qty 1

## 2017-03-30 MED ORDER — MORPHINE SULFATE (PF) 4 MG/ML IV SOLN
INTRAVENOUS | Status: AC
  Administered 2017-03-30: 07:00:00
  Filled 2017-03-30: qty 1

## 2017-03-30 MED ORDER — MORPHINE SULFATE (PF) 2 MG/ML IV SOLN
2.0000 mg | Freq: Four times a day (QID) | INTRAVENOUS | Status: DC | PRN
  Administered 2017-03-30 (×2): 2 mg via INTRAVENOUS
  Filled 2017-03-30 (×2): qty 1

## 2017-03-30 NOTE — ED Notes (Signed)
Pt back in bed at this time. Call light within reach will continue to monitor for changes.

## 2017-03-30 NOTE — Progress Notes (Signed)
Patient seen for assessment of thrombus in the abdominal aorta.  This does not appear to be related to her chest pain.  The lesion of concern is located within the abdominal aorta not the chest.  This is a very common finding and no immediate treatment is indicated.  This patient does not need to be on anticoagulation.  Given the findings regarding the workup for her chest pain it appears increasingly likely that this is inflammatory and I have prescribed Toradol as morphine is not giving her significant pain relief.  Full consult to follow

## 2017-03-30 NOTE — ED Provider Notes (Signed)
[facility]  I accepted care from Dr. Owens Shark ____________________________________________    LABS (pertinent positives/negatives)  I, Lisa Roca, MD have personally reviewed the lab reports noted below.  Labs Reviewed  CBC - Abnormal; Notable for the following:       Result Value   RBC 3.74 (*)    MCH 34.3 (*)    All other components within normal limits  COMPREHENSIVE METABOLIC PANEL - Abnormal; Notable for the following:    Total Protein 9.0 (*)    ALT 10 (*)    All other components within normal limits  TROPONIN I     CT chest:  IMPRESSION: 1. No evidence of pulmonary embolus. 2. Lungs clear bilaterally. 3. Mild slightly irregular mural thrombus along the left side of the proximal abdominal aorta, without significant luminal narrowing. 4. Small left renal cyst noted.  ____________________________________________   PROCEDURES  Procedure(s) performed: None  Critical Care performed: None  ____________________________________________   INITIAL IMPRESSION / ASSESSMENT AND PLAN / ED COURSE   Pertinent labs & imaging results that were available during my care of the patient were reviewed by me and considered in my medical decision making (see chart for details).  Accepted care from Dr. Owens Shark, patient with no CAD history with ongoing chest pain and reassuring ECG and troponin.  CT pending.  CT shows no PE, but prox abdominal aortic mural thrombus.  Dr. Delana Meyer unable to pull up images at present 8:25, but will address IT concerns and evaluate -- in general many do not necessarily require anticoagulation, and does not recommend starting until he can evaluate.  Hospitalist for admission for ongoing nausea/vomiting, uncontrolled in setting chest pain which is nonspecific and mostly resolved at this point.    CONSULTATIONS: Vascular surgeon Dr. Delana Meyer, to evaluate patient in the ER or in the hospital.    Patient / Family / Caregiver  informed of clinical course, medical decision-making process, and agree with plan.     ____________________________________________   FINAL CLINICAL IMPRESSION(S) / ED DIAGNOSES  Final diagnoses:  Chest pain, unspecified type  Aortic mural thrombus (HCC)  Nausea and vomiting in adult        Lisa Roca, MD 03/30/17 0830

## 2017-03-30 NOTE — ED Notes (Signed)
PT up to bathroom, with slow steady gait.

## 2017-03-30 NOTE — H&P (Signed)
[facility] [place]: Reneka Nebergall    MR#:  [id number]  DATE OF BIRTH:  09-Jun-1966  DATE OF ADMISSION:  03/30/2017  PRIMARY CARE PHYSICIAN: Herminio Commons, MD   REQUESTING/REFERRING PHYSICIAN: Dr. Lisa Roca  CHIEF COMPLAINT:   Chief Complaint  Patient presents with  . Chest Pain    HISTORY OF PRESENT ILLNESS:  Shaguana Love  is a 51 y.o. female with a known history of Systemic lupus, anxiety, hypertension who presents to the hospital due to chest pain. Patient says she developed chest pain yesterday that woke her up from sleep. She's been having intermittent chest pains ever since associated with some nausea but no vomiting. She denies any diaphoresis, palpitations, syncope or shortness of breath. She denies any exacerbating associated symptoms and says the pain feels better after she got some morphine while here in the ER. Patient underwent a CT scan of her chest with contrast which was negative for pulmonary embolism but incidentally was also noted to have a mural thrombus in her abdominal aorta. She continues to have mild chest pain and therefore hospitalist services were chronic for treatment and evaluation.  PAST MEDICAL HISTORY:   Past Medical History:  Diagnosis Date  . Anxiety   . Collagen vascular disease ([place])   . Hypertension     PAST SURGICAL HISTORY:   Past Surgical History:  Procedure Laterality Date  . ABDOMINAL HYSTERECTOMY    . BREAST BIOPSY Right 17+ years ago   negative Core Bx    SOCIAL HISTORY:   Social History  Substance Use Topics  . Smoking status: Former Smoker    Packs/day: 0.30    Years: 4.00    Types: Cigarettes    Quit date: 12/30/2003  . Smokeless tobacco: Never Used  . Alcohol use No    FAMILY HISTORY:   Family History  Problem Relation Age of Onset  . Arthritis Mother   . Hypertension Mother   . Breast cancer Neg Hx     DRUG ALLERGIES:  No Known Allergies  REVIEW OF  SYSTEMS:   Review of Systems  Constitutional: Negative for fever and weight loss.  HENT: Negative for congestion, nosebleeds and tinnitus.   Eyes: Negative for blurred vision, double vision and redness.  Respiratory: Negative for cough, hemoptysis and shortness of breath.   Cardiovascular: Positive for chest pain. Negative for orthopnea, leg swelling and PND.  Gastrointestinal: Negative for abdominal pain, diarrhea, melena, nausea and vomiting.  Genitourinary: Negative for dysuria, hematuria and urgency.  Musculoskeletal: Negative for falls and joint pain.  Neurological: Negative for dizziness, tingling, sensory change, focal weakness, seizures, weakness and headaches.  Endo/Heme/Allergies: Negative for polydipsia. Does not bruise/bleed easily.  Psychiatric/Behavioral: Negative for depression and memory loss. The patient is not nervous/anxious.     MEDICATIONS AT HOME:   Prior to Admission medications   Medication Sig Start Date End Date Taking? Authorizing Provider  hydrALAZINE (APRESOLINE) 25 MG tablet Take 1 tablet (25 mg total) by mouth 2 (two) times daily with a meal. 12/12/16 12/12/17 Yes Sable Feil, PA-C  hydroxychloroquine (PLAQUENIL) 200 MG tablet Take 100-200 mg by mouth 2 (two) times daily.   Yes [provider]  lisinopril (PRINIVIL,ZESTRIL) 40 MG tablet Take 1 tablet (40 mg total) by mouth daily. 12/12/16  Yes Sable Feil, PA-C  mometasone (ELOCON) 0.1 % cream Apply 1 application topically daily.   Yes [provider]  spironolactone (ALDACTONE) 25 MG tablet Take 1  tablet (25 mg total) by mouth 2 (two) times daily. 12/12/16 12/12/17 Yes Sable Feil, PA-C  triamcinolone ointment (KENALOG) 0.1 % Apply 1 application topically 2 (two) times daily.   Yes [provider]  cyclobenzaprine (FLEXERIL) 10 MG tablet Take 1 tablet (10 mg total) by mouth 3 (three) times daily as needed. Patient not taking: Reported on 03/30/2017 12/12/16   Sable Feil,  PA-C  diazepam (VALIUM) 5 MG tablet Take 1 tablet (5 mg total) by mouth every 8 (eight) hours as needed for muscle spasms. Patient not taking: Reported on 03/30/2017 11/11/14   Earleen Newport, MD  meloxicam (MOBIC) 15 MG tablet Take 1 tablet (15 mg total) by mouth daily. Patient not taking: Reported on 03/30/2017 10/02/15   Sable Feil, PA-C  polyethylene glycol [facility] / Floria Raveling) packet Drink the whole bottle today for constipation Patient not taking: Reported on 03/30/2017 12/10/15   Earleen Newport, MD  potassium chloride (K-DUR) 10 MEQ tablet Take 1 tablet (10 mEq total) by mouth daily. Patient not taking: Reported on 03/30/2017 11/30/14   Delman Kitten, MD  traMADol (ULTRAM) 50 MG tablet Take 1 tablet (50 mg total) by mouth every 6 (six) hours as needed. 12/12/16 12/12/17  Sable Feil, PA-C      VITAL SIGNS:  Blood pressure 113/79, pulse 60, temperature 97.7 F (36.5 C), temperature source Oral, resp. rate 20, height 5\' 3"  (1.6 m), weight 45.8 kg (101 lb), last menstrual period 12/29/2001, SpO2 100 %.  PHYSICAL EXAMINATION:  Physical Exam  GENERAL:  51 y.o.-year-old thin patient lying in the bed in no acute distress.  EYES: Pupils equal, round, reactive to light and accommodation. No scleral icterus. Extraocular muscles intact.  HEENT: Head atraumatic, normocephalic. Oropharynx and nasopharynx clear. No oropharyngeal erythema, moist oral mucosa  NECK:  Supple, no jugular venous distention. No thyroid enlargement, no tenderness.  LUNGS: Normal breath sounds bilaterally, no wheezing, rales, rhonchi. No use of accessory muscles of respiration.  CARDIOVASCULAR: S1, S2 RRR. No murmurs, rubs, gallops, clicks.  ABDOMEN: Soft, nontender, nondistended. Bowel sounds present. No organomegaly or mass.  EXTREMITIES: No pedal edema, cyanosis, or clubbing. + 2 pedal & radial pulses b/l.   NEUROLOGIC: Cranial nerves II through XII are intact. No focal Motor or sensory deficits  appreciated b/l PSYCHIATRIC: The patient is alert and oriented x 3.  SKIN: No obvious rash, lesion, or ulcer.   LABORATORY PANEL:   CBC  Recent Labs Lab 03/30/17 0455  WBC 7.3  HGB 12.8  HCT 36.7  PLT 158   ------------------------------------------------------------------------------------------------------------------  Chemistries   Recent Labs Lab 03/30/17 0455  NA 138  K 3.8  CL 104  CO2 25  GLUCOSE 96  BUN 19  CREATININE 0.98  CALCIUM 9.5  AST 20  ALT 10*  ALKPHOS 57  BILITOT 0.5   ------------------------------------------------------------------------------------------------------------------  Cardiac Enzymes  Recent Labs Lab 03/30/17 0455  TROPONINI <0.03   ------------------------------------------------------------------------------------------------------------------  RADIOLOGY:  Dg Chest 2 View  Result Date: 03/30/2017 CLINICAL DATA:  Acute onset of generalized chest pain. Initial encounter. EXAM: CHEST  2 VIEW COMPARISON:  Chest radiograph performed 11/11/2014, and CT of the chest performed 01/28/2015 FINDINGS: The lungs are well-aerated and clear. There is no evidence of focal opacification, pleural effusion or pneumothorax. The heart is normal in size; the mediastinal contour is within normal limits. No acute osseous abnormalities are seen. IMPRESSION: No acute cardiopulmonary process seen. Electronically Signed   By: Garald Balding M.D.   On: 03/30/2017  05:31   Ct Angio Chest Pe W And/or Wo Contrast  Result Date: 03/30/2017 CLINICAL DATA:  Acute onset of generalized chest pain. Initial encounter. EXAM: CT ANGIOGRAPHY CHEST WITH CONTRAST TECHNIQUE: Multidetector CT imaging of the chest was performed using the standard protocol during bolus administration of intravenous contrast. Multiplanar CT image reconstructions and MIPs were obtained to evaluate the vascular anatomy. CONTRAST:  75 mL of Isovue 370 IV contrast COMPARISON:  Chest radiograph  performed earlier today at 05:05 a.m., and CT of the chest performed 01/28/2015 FINDINGS: Cardiovascular:  There is no evidence of pulmonary embolus. The heart is normal in size. The thoracic aorta is grossly unremarkable in appearance. The great vessels are within normal limits. Mediastinum/Nodes: The mediastinum is unremarkable in appearance. No mediastinal lymphadenopathy is seen. No pericardial effusion is identified. The visualized portions of the thyroid gland are unremarkable. No axillary lymphadenopathy is seen. Lungs/Pleura: The lungs are clear bilaterally. No focal consolidation, pleural effusion or pneumothorax is seen. No masses are identified. Scattered normal appearing lymph nodes are noted along the major fissures bilaterally. Upper Abdomen: The visualized portions of the liver and spleen are unremarkable. The visualized portions of the gallbladder, pancreas, and adrenal glands are within normal limits. A small left renal cyst is noted. Mild slightly irregular mural thrombus is noted along the left side of the proximal abdominal aorta, without significant luminal narrowing. Musculoskeletal: No acute osseous abnormalities are identified. The visualized musculature is unremarkable in appearance. Review of the MIP images confirms the above findings. IMPRESSION: 1. No evidence of pulmonary embolus. 2. Lungs clear bilaterally. 3. Mild slightly irregular mural thrombus along the left side of the proximal abdominal aorta, without significant luminal narrowing. 4. Small left renal cyst noted. Electronically Signed   By: Garald Balding M.D.   On: 03/30/2017 07:00     IMPRESSION AND PLAN:   51 year old female with past medical history of lupus, hypertension, anxiety who presents to the hospital due to chest pain.  1. Chest pain- patient does have some risk factors for coronary disease given her history of lupus. -We'll observe on telemetry, cycle her cardiac markers, get a nuclear medicine stress test  in the morning. Patient's EKG has been reviewed shows no acute ST or T-wave changes. Her first set of cardiac markers are negative. -Continue aspirin, nitroglycerin, morphine.  2. Essential hypertension-continue lisinopril, Aldactone, hydralazine.  3. History of lupus-continue Plaquenil.  4. 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 -We'll obtain a vascular surgery consult. Hold off on anticoagulation until seen by vascular surgery.   All the records are reviewed and case discussed with ED provider. Management plans discussed with the patient, family and they are in agreement.  CODE STATUS: Full code  TOTAL TIME TAKING CARE OF THIS PATIENT: 45 minutes.    Henreitta Leber M.D on 03/30/2017 at 9:38 AM  Between 7am to 6pm - Pager - [phone]  After 6pm go to [email or web address] - password [email or web address] [place]  Office  [phone]  CC: Primary care physician; Herminio Commons, MD

## 2017-03-30 NOTE — ED Triage Notes (Signed)
Pt in with co chest pain that started yesterday, denies any cardiac hx but does have a hx of lupus. Pt denies any shob or diaphoresis, pt restless in triage.

## 2017-03-30 NOTE — ED Notes (Signed)
Pt states pain increased during CT. See [facility]

## 2017-03-30 NOTE — Progress Notes (Signed)
Initial Nutrition Assessment  DOCUMENTATION CODES:   Severe malnutrition in context of chronic illness  INTERVENTION:  1. Recommend Ensure Enlive po TID, each supplement provides 350 kcal and 20 grams of protein  NUTRITION DIAGNOSIS:   Malnutrition (Severe) related to chronic illness (Lupus) as evidenced by severe depletion of muscle mass, severe depletion of body fat.  GOAL:   Patient will meet greater than or equal to 90% of their needs  MONITOR:   PO intake, I & O's, Labs, Supplement acceptance, Weight trends  REASON FOR ASSESSMENT:   Malnutrition Screening Tool    ASSESSMENT:   Valerie Bell  is a 51 y.o. female with a known history of Systemic lupus, anxiety, hypertension who presents to the hospital due to chest pain  Spoke with patient at bedside. She reports poor ability to taste food since developing lupus. She also reports large weight loss, she is unsure of the amount but per chart weight has been stable for 2 years. Reports that since being diagnosed with lupus she has lost her ability to taste food and as a result, doesn't eat much anymore. She reports drinking an ensure or carnation instant breakfast for each meal. Doesn't eat much solid food.  Will order ensure during stay.  Nutrition-Focused physical exam completed. Findings are severe fat depletion, severe muscle depletion, and no edema.   Labs reviewed Medications reviewed and include:  Zofran PRN, Morphine PRN    Diet Order:  Diet Heart Room service appropriate? Yes; Fluid consistency: Thin  Skin:  Reviewed, no issues  Last BM:  03/29/2017  Height:   Ht Readings from Last 1 Encounters:  03/30/17 5\' 3"  (1.6 m)    Weight:   Wt Readings from Last 1 Encounters:  03/30/17 101 lb (45.8 kg)    Ideal Body Weight:  52.27 kg  BMI:  Body mass index is 17.89 kg/m.  Estimated Nutritional Needs:   Kcal:  1340-1600 calories  Protein:  69-78 grams  Fluid:  1.4-1.6L  EDUCATION NEEDS:    Education needs addressed  Satira Anis. Michelle Vanhise, MS, RD LDN Inpatient Clinical Dietitian Pager [phone]

## 2017-03-30 NOTE — ED Notes (Signed)
Pt called out and was nauseated. MD at bedside. Orders received.

## 2017-03-30 NOTE — ED Provider Notes (Signed)
[facility] Emergency Department Provider Note    First MD Initiated Contact with Patient 03/30/17 0543     (approximate)  I have reviewed the triage vital signs and the nursing notes.   HISTORY  Chief Complaint Chest Pain    HPI Valerie Bell is a 51 y.o. female with below list of chronic medical conditions including lupus presents to the emergency department with intermittent central chest pain radiating to the back since yesterday. Patient states at maximum intensity pain is 10 out of 10. Patient states the pain lasts as long as 30 minutes. Patient denies any associated dyspnea, diaphoresis no dizziness no nausea or vomiting. Patient states current pain score is 6 out of 10 stating that his decreased since arrival to the emergency department.   Past Medical History:  Diagnosis Date  . Anxiety   . Collagen vascular disease ([place])   . Hypertension     There are no active problems to display for this patient.   Past Surgical History:  Procedure Laterality Date  . ABDOMINAL HYSTERECTOMY    . BREAST BIOPSY Right 17+ years ago   negative Core Bx    Prior to Admission medications   Medication Sig Start Date End Date Taking? Authorizing Provider  cyclobenzaprine (FLEXERIL) 10 MG tablet Take 1 tablet (10 mg total) by mouth 3 (three) times daily as needed. 12/12/16   Sable Feil, PA-C  diazepam (VALIUM) 5 MG tablet Take 1 tablet (5 mg total) by mouth every 8 (eight) hours as needed for muscle spasms. 11/11/14   Earleen Newport, MD  hydrALAZINE (APRESOLINE) 25 MG tablet Take 1 tablet (25 mg total) by mouth 2 (two) times daily with a meal. 12/12/16 12/12/17  Sable Feil, PA-C  lisinopril (PRINIVIL,ZESTRIL) 40 MG tablet Take 1 tablet (40 mg total) by mouth daily. 12/12/16   Sable Feil, PA-C  meloxicam (MOBIC) 15 MG tablet Take 1 tablet (15 mg total) by mouth daily. 10/02/15   Sable Feil, PA-C  polyethylene glycol [facility] Floria Raveling) packet  Drink the whole bottle today for constipation 12/10/15   Earleen Newport, MD  potassium chloride (K-DUR) 10 MEQ tablet Take 1 tablet (10 mEq total) by mouth daily. 11/30/14   Delman Kitten, MD  spironolactone (ALDACTONE) 25 MG tablet Take 1 tablet (25 mg total) by mouth 2 (two) times daily. 12/12/16 12/12/17  Sable Feil, PA-C  traMADol (ULTRAM) 50 MG tablet Take 1 tablet (50 mg total) by mouth every 6 (six) hours as needed. 12/12/16 12/12/17  Sable Feil, PA-C    Allergies No known drug allergies  Family History  Problem Relation Age of Onset  . Breast cancer Neg Hx     Social History Social History  Substance Use Topics  . Smoking status: Former Smoker    Packs/day: 0.30    Years: 4.00    Types: Cigarettes    Quit date: 12/30/2003  . Smokeless tobacco: Never Used  . Alcohol use No    Review of Systems Constitutional: No fever/chills Eyes: No visual changes. ENT: No sore throat. Cardiovascular: Denies chest pain. Respiratory: Denies shortness of breath. Gastrointestinal: No abdominal pain.  No nausea, no vomiting.  No diarrhea.  No constipation. Genitourinary: Negative for dysuria. Musculoskeletal: Negative for neck pain.  Negative for back pain. Integumentary: Negative for rash. Neurological: Negative for headaches, focal weakness or numbness.   ____________________________________________   PHYSICAL EXAM:  VITAL SIGNS: ED Triage Vitals  Enc Vitals Group  BP 03/30/17 0455 113/79     Pulse Rate 03/30/17 0455 86     Resp 03/30/17 0455 20     Temp 03/30/17 0455 97.7 F (36.5 C)     Temp Source 03/30/17 0455 Oral     SpO2 03/30/17 0455 100 %     Weight 03/30/17 0454 45.8 kg (101 lb)     Height 03/30/17 0454 1.6 m (5\' 3" )     Head Circumference --      Peak Flow --      Pain Score 03/30/17 0453 10     Pain Loc --      Pain Edu? --      Excl. in [place]? --     Constitutional: Alert and oriented.Apparent discomfort Eyes: Conjunctivae are normal.  Head:  Atraumatic. Mouth/Throat: Mucous membranes are moist.  Oropharynx non-erythematous.* Neck: No stridor.  Cardiovascular: Normal rate, regular rhythm. Good peripheral circulation. Grossly normal heart sounds. Respiratory: Normal respiratory effort.  No retractions. Lungs CTAB. Gastrointestinal: Soft and nontender. No distention.  Musculoskeletal: No lower extremity tenderness nor edema. No gross deformities of extremities. Neurologic:  Normal speech and language. No gross focal neurologic deficits are appreciated.  Skin:  Skin is warm, dry and intact. No rash noted. Psychiatric: Mood and affect are normal. Speech and behavior are normal.  ____________________________________________   LABS (all labs ordered are listed, but only abnormal results are displayed)  Labs Reviewed  CBC - Abnormal; Notable for the following:       Result Value   RBC 3.74 (*)    MCH 34.3 (*)    All other components within normal limits  COMPREHENSIVE METABOLIC PANEL - Abnormal; Notable for the following:    Total Protein 9.0 (*)    ALT 10 (*)    All other components within normal limits  TROPONIN I   ____________________________________________  EKG ED ECG REPORT I, [place] N Adley Castello, the attending physician, personally viewed and interpreted this ECG.   Date: 03/30/2017  EKG Time: 4:52 AM  Rate: 86  Rhythm: Normal sinus rhythm  Axis: Normal  Intervals: Normal  ST&T Change: None  ____________________________________________  RADIOLOGY I, [place] N Byron Tipping, personally viewed and evaluated these images (plain radiographs) as part of my medical decision making, as well as reviewing the written report by the radiologist.  Dg Chest 2 View  Result Date: 03/30/2017 CLINICAL DATA:  Acute onset of generalized chest pain. Initial encounter. EXAM: CHEST  2 VIEW COMPARISON:  Chest radiograph performed 11/11/2014, and CT of the chest performed 01/28/2015 FINDINGS: The lungs are well-aerated and clear. There  is no evidence of focal opacification, pleural effusion or pneumothorax. The heart is normal in size; the mediastinal contour is within normal limits. No acute osseous abnormalities are seen. IMPRESSION: No acute cardiopulmonary process seen. Electronically Signed   By: Garald Balding M.D.   On: 03/30/2017 05:31   Ct Angio Chest Pe W And/or Wo Contrast  Result Date: 03/30/2017 CLINICAL DATA:  Acute onset of generalized chest pain. Initial encounter. EXAM: CT ANGIOGRAPHY CHEST WITH CONTRAST TECHNIQUE: Multidetector CT imaging of the chest was performed using the standard protocol during bolus administration of intravenous contrast. Multiplanar CT image reconstructions and MIPs were obtained to evaluate the vascular anatomy. CONTRAST:  75 mL of Isovue 370 IV contrast COMPARISON:  Chest radiograph performed earlier today at 05:05 a.m., and CT of the chest performed 01/28/2015 FINDINGS: Cardiovascular:  There is no evidence of pulmonary embolus. The heart is normal in size. The thoracic  aorta is grossly unremarkable in appearance. The great vessels are within normal limits. Mediastinum/Nodes: The mediastinum is unremarkable in appearance. No mediastinal lymphadenopathy is seen. No pericardial effusion is identified. The visualized portions of the thyroid gland are unremarkable. No axillary lymphadenopathy is seen. Lungs/Pleura: The lungs are clear bilaterally. No focal consolidation, pleural effusion or pneumothorax is seen. No masses are identified. Scattered normal appearing lymph nodes are noted along the major fissures bilaterally. Upper Abdomen: The visualized portions of the liver and spleen are unremarkable. The visualized portions of the gallbladder, pancreas, and adrenal glands are within normal limits. A small left renal cyst is noted. Mild slightly irregular mural thrombus is noted along the left side of the proximal abdominal aorta, without significant luminal narrowing. Musculoskeletal: No acute  osseous abnormalities are identified. The visualized musculature is unremarkable in appearance. Review of the MIP images confirms the above findings. IMPRESSION: 1. No evidence of pulmonary embolus. 2. Lungs clear bilaterally. 3. Mild slightly irregular mural thrombus along the left side of the proximal abdominal aorta, without significant luminal narrowing. 4. Small left renal cyst noted. Electronically Signed   By: Garald Balding M.D.   On: 03/30/2017 07:00     Procedures   ____________________________________________   INITIAL IMPRESSION / ASSESSMENT AND PLAN / ED COURSE  As part of my medical decision making, I reviewed the following data within the electronic MEDICAL RECORD NUMBER 51 year-old female presenting with above stated history of chest pain radiating to her back physical exam reveal apparent discomfort no other clinical findings. Given concern for possible aortic aneurysm versus dissection CT scan chest performed. Laboratory data obtained including troponin which is pending. Patient's EKG revealed no evidence of ischemia or infarction. Patient's care transferred to Dr. Reita Cliche. Patient given 2 doses of morphine with improvement of pain at this time. ____________________________________________  FINAL CLINICAL IMPRESSION(S) / ED DIAGNOSES  Final diagnoses:  Chest pain, unspecified type     MEDICATIONS GIVEN DURING THIS VISIT:  Medications  morphine 2 MG/ML injection 2 mg (not administered)  ondansetron (ZOFRAN) 4 MG/2ML injection (not administered)  morphine 4 MG/ML injection (not administered)  ondansetron (ZOFRAN) injection 4 mg (4 mg Intravenous Given 03/30/17 0615)  morphine 4 MG/ML injection (2 mg  Given 03/30/17 0615)  iopamidol (ISOVUE-370) 76 % injection 75 mL (75 mLs Intravenous Contrast Given 03/30/17 0633)  morphine 4 MG/ML injection 4 mg (4 mg Intravenous Given 03/30/17 0656)     NEW OUTPATIENT MEDICATIONS STARTED DURING THIS VISIT:  New Prescriptions   No  medications on file    Modified Medications   No medications on file    Discontinued Medications   No medications on file     Note:  This document was prepared using Dragon voice recognition software and may include unintentional dictation errors.    Gregor Hams, MD 03/30/17 [phone]

## 2017-03-30 NOTE — ED Notes (Signed)
Patient transported to CT 

## 2017-03-31 ENCOUNTER — Observation Stay: Payer: Medicaid Other

## 2017-03-31 LAB — NM MYOCAR MULTI W/SPECT W/WALL MOTION / EF
Estimated workload: 1 METS
Exercise duration (min): 1 min
Exercise duration (sec): 1 s
LV dias vol: 52 mL (ref 46–106)
LV sys vol: 22 mL
MPHR: 170 {beats}/min
Peak HR: 126 {beats}/min
Percent HR: 74 %
Rest HR: 71 {beats}/min
SDS: 0
SRS: 6
SSS: 0
TID: 0.81

## 2017-03-31 LAB — HIV ANTIBODY (ROUTINE TESTING W REFLEX): HIV Screen 4th Generation wRfx: NONREACTIVE

## 2017-03-31 MED ORDER — TECHNETIUM TC 99M TETROFOSMIN IV KIT
30.0000 | PACK | Freq: Once | INTRAVENOUS | Status: AC | PRN
  Administered 2017-03-31: 34.284 via INTRAVENOUS

## 2017-03-31 MED ORDER — ENSURE ENLIVE PO LIQD: 237.0000 mL | Freq: Three times a day (TID) | ORAL | 0 refills | Status: DC

## 2017-03-31 MED ORDER — PANTOPRAZOLE SODIUM 40 MG PO TBEC: 40.0000 mg | DELAYED_RELEASE_TABLET | Freq: Every day | ORAL | 0 refills | Status: DC

## 2017-03-31 MED ORDER — PANTOPRAZOLE SODIUM 40 MG PO TBEC
40.0000 mg | DELAYED_RELEASE_TABLET | Freq: Every day | ORAL | Status: DC
  Administered 2017-03-31: 40 mg via ORAL
  Filled 2017-03-31: qty 1

## 2017-03-31 MED ORDER — TECHNETIUM TC 99M TETROFOSMIN IV KIT
13.0000 | PACK | Freq: Once | INTRAVENOUS | Status: AC | PRN
  Administered 2017-03-31: 13.867 via INTRAVENOUS

## 2017-03-31 MED ORDER — REGADENOSON 0.4 MG/5ML IV SOLN
0.4000 mg | Freq: Once | INTRAVENOUS | Status: AC
  Administered 2017-03-31: 0.4 mg via INTRAVENOUS
  Filled 2017-03-31: qty 5

## 2017-03-31 NOTE — Consult Note (Signed)
[facility] Vascular Consult Note  MRN : [id number]  Valerie Bell is a 51 y.o. (July 22, 1965) female who presents with chief complaint of  Chief Complaint  Patient presents with  . Chest Pain  .  History of Present Illness: I am asked to evaluate the patient by Dr. Verdell Carmine for atheromatous debris found in the abdominal aorta.  The patient is a 51 year old woman with a history of systemic lupus who presented to the emergency room earlier today with increasing chest pain. She describes it as a central chest pain that radiates to the back. It is intermittent in nature. It is not related to position or exertion. When it comes on she feels that he can be as intense as a 10 out of 10. It is unpredictable. She underwent CT angiography which was negative for PE but demonstrated atheromatous material in the pararenal aorta.  Patient denies lower extremity pain, no history of embolization.    Current Facility-Administered Medications  Medication Dose Route Frequency Provider Last Rate Last Dose  . acetaminophen (TYLENOL) tablet 650 mg  650 mg Oral Q6H PRN Henreitta Leber, MD       Or  . acetaminophen (TYLENOL) suppository 650 mg  650 mg Rectal Q6H PRN Henreitta Leber, MD      . enoxaparin (LOVENOX) injection 40 mg  40 mg Subcutaneous Q24H Henreitta Leber, MD   40 mg at 03/30/17 2200  . feeding supplement (ENSURE ENLIVE) (ENSURE ENLIVE) liquid 237 mL  237 mL Oral TID BM Sainani, Belia Heman, MD      . hydrALAZINE (APRESOLINE) tablet 25 mg  25 mg Oral BID WC Henreitta Leber, MD   25 mg at 03/31/17 1135  . hydroxychloroquine (PLAQUENIL) tablet 200 mg  200 mg Oral Daily Henreitta Leber, MD   200 mg at 03/31/17 1135   And  . hydroxychloroquine (PLAQUENIL) tablet 100 mg  100 mg Oral QHS Henreitta Leber, MD   100 mg at 03/30/17 2201  . ketorolac (TORADOL) 30 MG/ML injection 30 mg  30 mg Intravenous Q6H PRN Schnier, Dolores Lory, MD   30 mg at 03/30/17 1844  . lisinopril  (PRINIVIL,ZESTRIL) tablet 40 mg  40 mg Oral Daily Henreitta Leber, MD   40 mg at 03/31/17 1135  . mometasone (ELOCON) 0.1 % cream 1 application  1 application Topical Daily Sainani, Vivek J, MD      . morphine 2 MG/ML injection 2 mg  2 mg Intravenous Once Gregor Hams, MD      . morphine 2 MG/ML injection 2 mg  2 mg Intravenous Q6H PRN Henreitta Leber, MD   2 mg at 03/30/17 2202  . nitroGLYCERIN (NITROSTAT) SL tablet 0.4 mg  0.4 mg Sublingual Q5 min PRN Henreitta Leber, MD   0.4 mg at 03/30/17 1830  . ondansetron (ZOFRAN) tablet 4 mg  4 mg Oral Q6H PRN Henreitta Leber, MD       Or  . ondansetron (ZOFRAN) injection 4 mg  4 mg Intravenous Q6H PRN Henreitta Leber, MD   4 mg at 03/30/17 1335  . pantoprazole (PROTONIX) EC tablet 40 mg  40 mg Oral Daily Loletha Grayer, MD   40 mg at 03/31/17 1320  . spironolactone (ALDACTONE) tablet 25 mg  25 mg Oral BID Henreitta Leber, MD   25 mg at 03/31/17 1135  . traMADol (ULTRAM) tablet 50 mg  50 mg Oral Q6H PRN Henreitta Leber, MD      .  triamcinolone ointment (KENALOG) 0.1 % 1 application  1 application Topical BID Henreitta Leber, MD   1 application at 30/16/01 2202   Current Outpatient Prescriptions  Medication Sig Dispense Refill  . hydrALAZINE (APRESOLINE) 25 MG tablet Take 1 tablet (25 mg total) by mouth 2 (two) times daily with a meal. 60 tablet 11  . hydroxychloroquine (PLAQUENIL) 200 MG tablet Take 100-200 mg by mouth 2 (two) times daily.    Marland Kitchen lisinopril (PRINIVIL,ZESTRIL) 40 MG tablet Take 1 tablet (40 mg total) by mouth daily. 30 tablet 11  . mometasone (ELOCON) 0.1 % cream Apply 1 application topically daily.    Marland Kitchen spironolactone (ALDACTONE) 25 MG tablet Take 1 tablet (25 mg total) by mouth 2 (two) times daily. 60 tablet 11  . triamcinolone ointment (KENALOG) 0.1 % Apply 1 application topically 2 (two) times daily.    . feeding supplement, ENSURE ENLIVE, (ENSURE ENLIVE) LIQD Take 237 mLs by mouth 3 (three) times daily between meals. 60  mL 0  . pantoprazole (PROTONIX) 40 MG tablet Take 1 tablet (40 mg total) by mouth daily. 30 tablet 0  . traMADol (ULTRAM) 50 MG tablet Take 1 tablet (50 mg total) by mouth every 6 (six) hours as needed. 20 tablet 0    Past Medical History:  Diagnosis Date  . Anxiety   . Collagen vascular disease ([place])   . Hypertension     Past Surgical History:  Procedure Laterality Date  . ABDOMINAL HYSTERECTOMY    . BREAST BIOPSY Right 17+ years ago   negative Core Bx    Social History Social History  Substance Use Topics  . Smoking status: Former Smoker    Packs/day: 0.30    Years: 4.00    Types: Cigarettes    Quit date: 12/30/2003  . Smokeless tobacco: Never Used  . Alcohol use No    Family History Family History  Problem Relation Age of Onset  . Arthritis Mother   . Hypertension Mother   . Breast cancer Neg Hx   No family history of bleeding/clotting disorders, porphyria or autoimmune disease   No Known Allergies   REVIEW OF SYSTEMS (Negative unless checked)  Constitutional: [] Weight loss  [] Fever  [] Chills Cardiac: [x] Chest pain   [] Chest pressure   [] Palpitations   [] Shortness of breath when laying flat   [] Shortness of breath at rest   [] Shortness of breath with exertion. Vascular:  [] Pain in legs with walking   [] Pain in legs at rest   [] Pain in legs when laying flat   [] Claudication   [] Pain in feet when walking  [] Pain in feet at rest  [] Pain in feet when laying flat   [] History of DVT   [] Phlebitis   [x] Swelling in legs   [] Varicose veins   [] Non-healing ulcers Pulmonary:   [] Uses home oxygen   [] Productive cough   [] Hemoptysis   [] Wheeze  [] COPD   [] Asthma Neurologic:  [] Dizziness  [] Blackouts   [] Seizures   [] History of stroke   [] History of TIA  [] Aphasia   [] Temporary blindness   [] Dysphagia   [] Weakness or numbness in arms   [] Weakness or numbness in legs Musculoskeletal:  [] Arthritis   [x] Joint swelling   [x] Joint pain   [] Low back pain Hematologic:  [] Easy bruising   [] Easy bleeding   [] Hypercoagulable state   [] Anemic  [] Hepatitis Gastrointestinal:  [] Blood in stool   [] Vomiting blood  [] Gastroesophageal reflux/heartburn   [] Difficulty swallowing. Genitourinary:  [] Chronic kidney disease   [] Difficult urination  [] Frequent urination  []   Burning with urination   [] Blood in urine Skin:  [] Rashes   [] Ulcers   [] Wounds Psychological:  [] History of anxiety   []  History of major depression.  Physical Examination  Vitals:   03/30/17 1945 03/30/17 2152 03/31/17 0553 03/31/17 1233  BP: 105/78  114/78 118/76  Pulse: 68  65 84  Resp:    14  Temp: (!) 97.4 F (36.3 C) 97.7 F (36.5 C) 97.9 F (36.6 C) 98.6 F (37 C)  TempSrc: Oral Oral Oral Oral  SpO2: 100%  100% 100%  Weight:      Height:       Body mass index is 17.89 kg/m. Gen:  WD/WN, NAD Head: [place]/AT, No temporalis wasting. Prominent temp pulse not noted. Ear/Nose/Throat: Hearing grossly intact, nares w/o erythema or drainage, oropharynx w/o Erythema/Exudate Eyes: Sclera non-icteric, conjunctiva clear Neck: Trachea midline.  No JVD.  Pulmonary:  Good air movement, respirations not labored, equal bilaterally.  Cardiac: RRR, normal S1, S2. Vascular: Vessel Right Left  Radial Palpable Palpable  Ulnar Palpable Palpable  Brachial Palpable Palpable  Carotid Palpable, without bruit Palpable, without bruit  Aorta Not palpable N/A  Femoral Palpable Palpable  Popliteal Palpable Palpable  PT Palpable Palpable  DP Palpable Palpable   Gastrointestinal: soft, non-tender/non-distended. No guarding/reflex.  Musculoskeletal: M/S 5/5 throughout.  Extremities without ischemic changes.  No deformity or atrophy. No edema. Neurologic: Sensation grossly intact in extremities.  Symmetrical.  Speech is fluent. Motor exam as listed above. Psychiatric: Judgment intact, Mood & affect appropriate for pt's clinical situation. Dermatologic: No rashes or ulcers noted.  No cellulitis or open wounds. Lymph : No Cervical,  Axillary, or Inguinal lymphadenopathy.      CBC Lab Results  Component Value Date   WBC 7.3 03/30/2017   HGB 12.8 03/30/2017   HCT 36.7 03/30/2017   MCV 98.3 03/30/2017   PLT 158 03/30/2017    BMET    Component Value Date/Time   NA 138 03/30/2017 0455   NA 136 07/13/2013 1022   K 3.8 03/30/2017 0455   K 3.4 (L) 07/13/2013 1022   CL 104 03/30/2017 0455   CL 107 07/13/2013 1022   CO2 25 03/30/2017 0455   CO2 23 07/13/2013 1022   GLUCOSE 96 03/30/2017 0455   GLUCOSE 86 07/13/2013 1022   BUN 19 03/30/2017 0455   BUN 19 (H) 07/13/2013 1022   CREATININE 0.98 03/30/2017 0455   CREATININE 0.82 07/13/2013 1022   CALCIUM 9.5 03/30/2017 0455   CALCIUM 8.7 07/13/2013 1022   GFRNONAA >60 03/30/2017 0455   GFRNONAA >60 07/13/2013 1022   GFRAA >60 03/30/2017 0455   GFRAA >60 07/13/2013 1022   Estimated Creatinine Clearance: 49.7 mL/min (by C-G formula based on SCr of 0.98 mg/dL).  COAG No results found for: INR, PROTIME  Radiology Dg Chest 2 View  Result Date: 03/30/2017 CLINICAL DATA:  Acute onset of generalized chest pain. Initial encounter. EXAM: CHEST  2 VIEW COMPARISON:  Chest radiograph performed 11/11/2014, and CT of the chest performed 01/28/2015 FINDINGS: The lungs are well-aerated and clear. There is no evidence of focal opacification, pleural effusion or pneumothorax. The heart is normal in size; the mediastinal contour is within normal limits. No acute osseous abnormalities are seen. IMPRESSION: No acute cardiopulmonary process seen. Electronically Signed   By: Garald Balding M.D.   On: 03/30/2017 05:31   Ct Angio Chest Pe W And/or Wo Contrast  Result Date: 03/30/2017 CLINICAL DATA:  Acute onset of generalized chest pain. Initial encounter. EXAM:  CT ANGIOGRAPHY CHEST WITH CONTRAST TECHNIQUE: Multidetector CT imaging of the chest was performed using the standard protocol during bolus administration of intravenous contrast. Multiplanar CT image reconstructions and  MIPs were obtained to evaluate the vascular anatomy. CONTRAST:  75 mL of Isovue 370 IV contrast COMPARISON:  Chest radiograph performed earlier today at 05:05 a.m., and CT of the chest performed 01/28/2015 FINDINGS: Cardiovascular:  There is no evidence of pulmonary embolus. The heart is normal in size. The thoracic aorta is grossly unremarkable in appearance. The great vessels are within normal limits. Mediastinum/Nodes: The mediastinum is unremarkable in appearance. No mediastinal lymphadenopathy is seen. No pericardial effusion is identified. The visualized portions of the thyroid gland are unremarkable. No axillary lymphadenopathy is seen. Lungs/Pleura: The lungs are clear bilaterally. No focal consolidation, pleural effusion or pneumothorax is seen. No masses are identified. Scattered normal appearing lymph nodes are noted along the major fissures bilaterally. Upper Abdomen: The visualized portions of the liver and spleen are unremarkable. The visualized portions of the gallbladder, pancreas, and adrenal glands are within normal limits. A small left renal cyst is noted. Mild slightly irregular mural thrombus is noted along the left side of the proximal abdominal aorta, without significant luminal narrowing. Musculoskeletal: No acute osseous abnormalities are identified. The visualized musculature is unremarkable in appearance. Review of the MIP images confirms the above findings. IMPRESSION: 1. No evidence of pulmonary embolus. 2. Lungs clear bilaterally. 3. Mild slightly irregular mural thrombus along the left side of the proximal abdominal aorta, without significant luminal narrowing. 4. Small left renal cyst noted. Electronically Signed   By: Garald Balding M.D.   On: 03/30/2017 07:00   Nm Myocar Multi W/spect W/wall Motion / Ef  Result Date: 03/31/2017  Blood pressure demonstrated a normal response to exercise.  There was no ST segment deviation noted during stress.  The study is normal.  This is a  low risk study.  The left ventricular ejection fraction is normal (55-65%).       Assessment/Plan 1. 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  No need for anticoagulation.   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  The patient does not voice lifestyle limiting changes at this point in time.  No invasive studies, angiography or surgery at this time The patient should continue walking and begin a more formal exercise program.  The patient should continue antiplatelet therapy and aggressive treatment of the lipid abnormalities  No changes in the patient's medications at this time  The patient should continue wearing graduated compression socks 10-15 mmHg strength to control the mild edema.    2. Chest pain- patient does have some risk factors for coronary disease given her history of lupus.  No ECG cahnges no elevation of her troponin's; negative for PE  I believe theis is likely musculoskeletal and will add Toradol to see if this provides better pain control   3. History of lupus-continue Plaquenil.  4. Essential hypertension-continue lisinopril, Aldactone, hydralazine.   Hortencia Pilar, MD  03/31/2017 2:50 PM    This note was created with Dragon medical transcription system.  Any error is purely unintentional

## 2017-03-31 NOTE — Discharge Summary (Signed)
[facility] [place] NAME: Valerie Bell    MR#:  [id number]  DATE OF BIRTH:  [birth year]  DATE OF ADMISSION:  03/30/2017 ADMITTING PHYSICIAN: Henreitta Leber, MD  DATE OF DISCHARGE: 03/31/2017  1:40 PM  PRIMARY CARE PHYSICIAN: Soles, Howell Rucks, MD    ADMISSION DIAGNOSIS:  Chest pain  DISCHARGE DIAGNOSIS:  Active Problems:   Chest pain   SECONDARY DIAGNOSIS:   Past Medical History:  Diagnosis Date  . Anxiety   . Collagen vascular disease ([place])   . Hypertension     HOSPITAL COURSE:   1. Chest pain. Cardiac enzymes 3 was negative. Stress test negative. CT angiogram of the chest was negative for pulmonary  Embolism and dissection. I think the patient's pain is likely gastrointestinal in nature since the patient does have some epigastric pain. No signs of bleeding. I did prescribe Protonix. Follow-up PMD one week. If still having symptoms of chest discomfort, refer for endoscopy as outpatient. 2. Lupus with NA being positive with speckled pattern. Given the number of Dr. Jefm Bryant rheumatology.  Potentially can consider stopping hydralazine as outpatient.  Patient on Plaquenil 3. Essential hypertension on lisinoril, hydralazine and spironolactone 4. Mural thrombus. Vascular surgery stated that this patient does not need anticoagulation.  DISCHARGE CONDITIONS:   satisfactory  CONSULTS OBTAINED:  vascular surgery  DRUG ALLERGIES:  No Known Allergies  DISCHARGE MEDICATIONS:   Discharge Medication List as of 03/31/2017  1:16 PM    START taking these medications   Details  feeding supplement, ENSURE ENLIVE, (ENSURE ENLIVE) LIQD Take 237 mLs by mouth 3 (three) times daily between meals., Starting Sat 03/31/2017, Print    pantoprazole (PROTONIX) 40 MG tablet Take 1 tablet (40 mg total) by mouth daily., Starting Sat 03/31/2017, Print      CONTINUE these medications which have NOT CHANGED   Details  hydrALAZINE (APRESOLINE) 25  MG tablet Take 1 tablet (25 mg total) by mouth 2 (two) times daily with a meal., Starting Tue 12/12/2016, Until Wed 12/12/2017, Print    hydroxychloroquine (PLAQUENIL) 200 MG tablet Take 100-200 mg by mouth 2 (two) times daily., Historical Med    lisinopril (PRINIVIL,ZESTRIL) 40 MG tablet Take 1 tablet (40 mg total) by mouth daily., Starting Tue 12/12/2016, Print    mometasone (ELOCON) 0.1 % cream Apply 1 application topically daily., Historical Med    spironolactone (ALDACTONE) 25 MG tablet Take 1 tablet (25 mg total) by mouth 2 (two) times daily., Starting Tue 12/12/2016, Until Wed 12/12/2017, Print    triamcinolone ointment (KENALOG) 0.1 % Apply 1 application topically 2 (two) times daily., Historical Med    traMADol (ULTRAM) 50 MG tablet Take 1 tablet (50 mg total) by mouth every 6 (six) hours as needed., Starting Tue 12/12/2016, Until Wed 12/12/2017, Print      STOP taking these medications     cyclobenzaprine (FLEXERIL) 10 MG tablet      diazepam (VALIUM) 5 MG tablet      meloxicam (MOBIC) 15 MG tablet      polyethylene glycol (MIRALAX / GLYCOLAX) packet      potassium chloride (K-DUR) 10 MEQ tablet          DISCHARGE INSTRUCTIONS:   Follow-up PMD one week  If you experience worsening of your admission symptoms, develop shortness of breath, life threatening emergency, suicidal or homicidal thoughts you must seek medical attention immediately by calling 911 or calling your MD immediately  if symptoms less severe.  You Must  read complete instructions/literature along with all the possible adverse reactions/side effects for all the Medicines you take and that have been prescribed to you. Take any new Medicines after you have completely understood and accept all the possible adverse reactions/side effects.   Please note  You were cared for by a hospitalist during your hospital stay. If you have any questions about your discharge medications or the care you received while you were  in the hospital after you are discharged, you can call the unit and asked to speak with the hospitalist on call if the hospitalist that took care of you is not available. Once you are discharged, your primary care physician will handle any further medical issues. Please note that NO REFILLS for any discharge medications will be authorized once you are discharged, as it is imperative that you return to your primary care physician (or establish a relationship with a primary care physician if you do not have one) for your aftercare needs so that they can reassess your need for medications and monitor your lab values.    Today   CHIEF COMPLAINT:   Chief Complaint  Patient presents with  . Chest Pain    HISTORY OF PRESENT ILLNESS:  Valerie Bell  is a 51 y.o. female presented with chest pain   VITAL SIGNS:  Blood pressure 118/76, pulse 84, temperature 98.6 F (37 C), temperature source Oral, resp. rate 14, height 5\' 3"  (1.6 m), weight 45.8 kg (101 lb), last menstrual period 12/29/2001, SpO2 100 %.   PHYSICAL EXAMINATION:  GENERAL:  51 y.o.-year-old patient lying in the bed with no acute distress.  EYES: Pupils equal, round, reactive to light and accommodation. No scleral icterus. Extraocular muscles intact.  HEENT: Head atraumatic, normocephalic. Oropharynx and nasopharynx clear.  NECK:  Supple, no jugular venous distention. No thyroid enlargement, no tenderness.  LUNGS: Normal breath sounds bilaterally, no wheezing, rales,rhonchi or crepitation. No use of accessory muscles of respiration.  CARDIOVASCULAR: S1, S2 normal. No murmurs, rubs, or gallops.  ABDOMEN: Soft, slight epigastric tenderness, non-distended. Bowel sounds present. No organomegaly or mass.  EXTREMITIES: No pedal edema, cyanosis, or clubbing.  NEUROLOGIC: Cranial nerves II through XII are intact. Muscle strength 5/5 in all extremities. Sensation intact. Gait not checked.  PSYCHIATRIC: The patient is alert and oriented x 3.   SKIN: No obvious rash, lesion, or ulcer.   DATA REVIEW:   CBC  Recent Labs Lab 03/30/17 0455  WBC 7.3  HGB 12.8  HCT 36.7  PLT 158    Chemistries   Recent Labs Lab 03/30/17 0455  NA 138  K 3.8  CL 104  CO2 25  GLUCOSE 96  BUN 19  CREATININE 0.98  CALCIUM 9.5  AST 20  ALT 10*  ALKPHOS 57  BILITOT 0.5    Cardiac Enzymes  Recent Labs Lab 03/30/17 1919  TROPONINI <0.03      RADIOLOGY:  Dg Chest 2 View  Result Date: 03/30/2017 CLINICAL DATA:  Acute onset of generalized chest pain. Initial encounter. EXAM: CHEST  2 VIEW COMPARISON:  Chest radiograph performed 11/11/2014, and CT of the chest performed 01/28/2015 FINDINGS: The lungs are well-aerated and clear. There is no evidence of focal opacification, pleural effusion or pneumothorax. The heart is normal in size; the mediastinal contour is within normal limits. No acute osseous abnormalities are seen. IMPRESSION: No acute cardiopulmonary process seen. Electronically Signed   By: Garald Balding M.D.   On: 03/30/2017 05:31   Ct Angio Chest Pe W And/or  Wo Contrast  Result Date: 03/30/2017 CLINICAL DATA:  Acute onset of generalized chest pain. Initial encounter. EXAM: CT ANGIOGRAPHY CHEST WITH CONTRAST TECHNIQUE: Multidetector CT imaging of the chest was performed using the standard protocol during bolus administration of intravenous contrast. Multiplanar CT image reconstructions and MIPs were obtained to evaluate the vascular anatomy. CONTRAST:  75 mL of Isovue 370 IV contrast COMPARISON:  Chest radiograph performed earlier today at 05:05 a.m., and CT of the chest performed 01/28/2015 FINDINGS: Cardiovascular:  There is no evidence of pulmonary embolus. The heart is normal in size. The thoracic aorta is grossly unremarkable in appearance. The great vessels are within normal limits. Mediastinum/Nodes: The mediastinum is unremarkable in appearance. No mediastinal lymphadenopathy is seen. No pericardial effusion is  identified. The visualized portions of the thyroid gland are unremarkable. No axillary lymphadenopathy is seen. Lungs/Pleura: The lungs are clear bilaterally. No focal consolidation, pleural effusion or pneumothorax is seen. No masses are identified. Scattered normal appearing lymph nodes are noted along the major fissures bilaterally. Upper Abdomen: The visualized portions of the liver and spleen are unremarkable. The visualized portions of the gallbladder, pancreas, and adrenal glands are within normal limits. A small left renal cyst is noted. Mild slightly irregular mural thrombus is noted along the left side of the proximal abdominal aorta, without significant luminal narrowing. Musculoskeletal: No acute osseous abnormalities are identified. The visualized musculature is unremarkable in appearance. Review of the MIP images confirms the above findings. IMPRESSION: 1. No evidence of pulmonary embolus. 2. Lungs clear bilaterally. 3. Mild slightly irregular mural thrombus along the left side of the proximal abdominal aorta, without significant luminal narrowing. 4. Small left renal cyst noted. Electronically Signed   By: Garald Balding M.D.   On: 03/30/2017 07:00   Nm Myocar Multi W/spect W/wall Motion / Ef  Result Date: 03/31/2017  Blood pressure demonstrated a normal response to exercise.  There was no ST segment deviation noted during stress.  The study is normal.  This is a low risk study.  The left ventricular ejection fraction is normal (55-65%).        Management plans discussed with the patient, family and they are in agreement.  CODE STATUS:  Code Status History    Date Active Date Inactive Code Status Order ID Comments User Context   03/30/2017 11:22 AM 03/31/2017  4:47 PM Full Code [id number]  Henreitta Leber, MD Inpatient      TOTAL TIME TAKING CARE OF THIS PATIENT: 35 minutes.    Loletha Grayer M.D on 03/31/2017 at 5:58 PM  Between 7am to 6pm - Pager -  [phone]  After 6pm go to [email or web address] - password [email or web address]  [facility] Office  [phone]  CC: Primary care physician; Herminio Commons, MD

## 2017-03-31 NOTE — Progress Notes (Signed)
Pt to be discharged today. Iv and tele removed. disch instructions and prescrips given to pt to her understanding. disch via w.c. Accompanied by family member

## 2017-04-25 DIAGNOSIS — L932 Other local lupus erythematosus: Secondary | ICD-10-CM | POA: Insufficient documentation

## 2017-06-28 DIAGNOSIS — D472 Monoclonal gammopathy: Secondary | ICD-10-CM | POA: Insufficient documentation

## 2017-06-28 DIAGNOSIS — C9000 Multiple myeloma not having achieved remission: Secondary | ICD-10-CM

## 2017-06-28 HISTORY — DX: Monoclonal gammopathy: D47.2

## 2017-06-28 HISTORY — DX: Multiple myeloma not having achieved remission: C90.00

## 2017-12-20 ENCOUNTER — Emergency Department: Payer: Medicaid Other

## 2017-12-20 ENCOUNTER — Emergency Department
Admission: EM | Admit: 2017-12-20 | Discharge: 2017-12-20 | Disposition: A | Discharge status: Home / Self Care | Payer: Medicaid Other | Attending: Emergency Medicine | Admitting: Emergency Medicine

## 2017-12-20 DIAGNOSIS — Z79899 Other long term (current) drug therapy: Secondary | ICD-10-CM | POA: Insufficient documentation

## 2017-12-20 DIAGNOSIS — R200 Anesthesia of skin: Secondary | ICD-10-CM | POA: Diagnosis present

## 2017-12-20 DIAGNOSIS — I10 Essential (primary) hypertension: Secondary | ICD-10-CM | POA: Diagnosis not present

## 2017-12-20 DIAGNOSIS — Z87891 Personal history of nicotine dependence: Secondary | ICD-10-CM | POA: Insufficient documentation

## 2017-12-20 NOTE — Discharge Instructions (Addendum)
Follow-up with your regular doctor for further evaluation.  Please call them for an appointment.  Your CT is negative and it does not appear that you have had any strokelike events.

## 2017-12-20 NOTE — ED Triage Notes (Signed)
Pt reports that she fell about a week or 2 ago.  Pt states she is not having any feeling when someone else touches her rib cage.  Pt is A&Ox4, in NAD.

## 2017-12-20 NOTE — ED Notes (Signed)
Pt reports she fell approximately 2 weeks ago. Pt reports since she has been having no sense of feeling when someone touches her left rib area. No bruising noted. Pt denies pain or injury. Resp even and unlabored, lungs clear. Full ROM to all extremities.

## 2017-12-20 NOTE — ED Provider Notes (Signed)
[facility] Emergency Department Provider Note  ____________________________________________   First MD Initiated Contact with Patient 12/20/17 2133     (approximate)  I have reviewed the triage vital signs and the nursing notes.   HISTORY  Chief Complaint Fall    HPI Valerie Bell is a 52 y.o. female presents emergency department complaining of numbness in the left rib area.  She states she cannot feel someone touches her there.  She has had some dizziness and frequent falls over the last 4 weeks.  She is unsure why.  She denies any slurred speech or other strokelike symptoms.  Past Medical History:  Diagnosis Date  . Anxiety   . Collagen vascular disease ([place])   . Hypertension     Patient Active Problem List   Diagnosis Date Noted  . Chest pain 03/30/2017    Past Surgical History:  Procedure Laterality Date  . ABDOMINAL HYSTERECTOMY    . BREAST BIOPSY Right 17+ years ago   negative Core Bx    Prior to Admission medications   Medication Sig Start Date End Date Taking? Authorizing Provider  feeding supplement, ENSURE ENLIVE, (ENSURE ENLIVE) LIQD Take 237 mLs by mouth 3 (three) times daily between meals. 03/31/17   Loletha Grayer, MD  hydrALAZINE (APRESOLINE) 25 MG tablet Take 1 tablet (25 mg total) by mouth 2 (two) times daily with a meal. 12/12/16 12/12/17  Sable Feil, PA-C  hydroxychloroquine (PLAQUENIL) 200 MG tablet Take 100-200 mg by mouth 2 (two) times daily.    [provider]  lisinopril (PRINIVIL,ZESTRIL) 40 MG tablet Take 1 tablet (40 mg total) by mouth daily. 12/12/16   Sable Feil, PA-C  mometasone (ELOCON) 0.1 % cream Apply 1 application topically daily.    [provider]  pantoprazole (PROTONIX) 40 MG tablet Take 1 tablet (40 mg total) by mouth daily. 03/31/17   Loletha Grayer, MD  spironolactone (ALDACTONE) 25 MG tablet Take 1 tablet (25 mg total) by mouth 2 (two) times daily. 12/12/16 12/12/17  Sable Feil, PA-C  triamcinolone ointment (KENALOG) 0.1 % Apply 1 application topically 2 (two) times daily.    [provider]    Allergies Patient has no known allergies.  Family History  Problem Relation Age of Onset  . Arthritis Mother   . Hypertension Mother   . Breast cancer Neg Hx     Social History Social History   Tobacco Use  . Smoking status: Former Smoker    Packs/day: 0.30    Years: 4.00    Pack years: 1.20    Types: Cigarettes    Last attempt to quit: 12/30/2003    Years since quitting: 13.9  . Smokeless tobacco: Never Used  Substance Use Topics  . Alcohol use: No    Alcohol/week: 0.0 oz  . Drug use: Yes    Types: Marijuana    Comment: 2 days ago.     Review of Systems  Constitutional: No fever/chills Eyes: No visual changes. ENT: No sore throat. Respiratory: Denies cough Genitourinary: Negative for dysuria. Musculoskeletal: Negative for back pain. Neurological: Positive for numbness to the left rib area Skin: Negative for rash.    ____________________________________________   PHYSICAL EXAM:  VITAL SIGNS: ED Triage Vitals  Enc Vitals Group     BP 12/20/17 2109 136/82     Pulse Rate 12/20/17 2109 83     Resp 12/20/17 2109 16     Temp 12/20/17 2109 98.3 F (36.8 C)  Temp Source 12/20/17 2109 Oral     SpO2 12/20/17 2109 100 %     Weight 12/20/17 2109 91 lb (41.3 kg)     Height 12/20/17 2109 5\' 3"  (1.6 m)     Head Circumference --      Peak Flow --      Pain Score 12/20/17 2115 0     Pain Loc --      Pain Edu? --      Excl. in [place]? --     Constitutional: Alert and oriented. Well appearing and in no acute distress.  Able to speak clearly and answer all questions Eyes: Conjunctivae are normal.  Head: Atraumatic. Nose: No congestion/rhinnorhea. Mouth/Throat: Mucous membranes are moist. Neck: Is supple, no lymphadenopathy is noted Cardiovascular: Normal rate, regular rhythm.  Heart sounds are normal Respiratory: Normal  respiratory effort.  No retractions, lungs clear to auscultation Abdomen: Is soft nontender GU: deferred Musculoskeletal: FROM all extremities, warm and well perfused, palpation of the left ribs reproduces a numbness feeling for the patient.  She states she cannot feel where I am touching. Neurologic:  Normal speech and language.  Skin:  Skin is warm, dry and intact. No rash noted. Psychiatric: Mood and affect are normal. Speech and behavior are normal.  ____________________________________________   LABS (all labs ordered are listed, but only abnormal results are displayed)  Labs Reviewed - No data to display ____________________________________________   ____________________________________________  RADIOLOGY  CT of the head is negative for any tumors, stroke, or other acute abnormalities  ____________________________________________   PROCEDURES  Procedure(s) performed: No  Procedures    ____________________________________________   INITIAL IMPRESSION / ASSESSMENT AND PLAN / ED COURSE  Pertinent labs & imaging results that were available during my care of the patient were reviewed by me and considered in my medical decision making (see chart for details).  Patient is 52 year old female presents emergency department complaining of numbness to the left ribs.  Physical exam she appears well.  She is very thin.  The area does have a decreased sensation to touch.  Remainder the exam is unremarkable  CT of the head due to the dizziness and decreased sensation is negative.  Discussed the CT results with patient.  She is to follow-up with her regular doctor next week.  Return emergency department if worsening.  She states she understands to comply with our instructions.  Was discharged in stable condition     As part of my medical decision making, I reviewed the following data within the [place] notes reviewed and incorporated, Old chart  reviewed, Notes from prior ED visits and [place] Controlled Substance Database  ____________________________________________   FINAL CLINICAL IMPRESSION(S) / ED DIAGNOSES  Final diagnoses:  Numbness      NEW MEDICATIONS STARTED DURING THIS VISIT:  New Prescriptions   No medications on file     Note:  This document was prepared using Dragon voice recognition software and may include unintentional dictation errors.    Versie Starks, PA-C 12/20/17 2230    Delman Kitten, MD 12/21/17 [phone]

## 2018-01-07 DIAGNOSIS — R531 Weakness: Secondary | ICD-10-CM | POA: Insufficient documentation

## 2018-01-18 MED ORDER — BISACODYL 10 MG RE SUPP
10.00 | RECTAL | Status: DC
Start: ? — End: 2018-01-18

## 2018-01-18 MED ORDER — ACETAMINOPHEN 325 MG PO TABS
650.00 | ORAL_TABLET | ORAL | Status: DC
Start: ? — End: 2018-01-18

## 2018-01-18 MED ORDER — HYDROXYCHLOROQUINE SULFATE 200 MG PO TABS
200.00 | ORAL_TABLET | ORAL | Status: DC
Start: 2018-01-18 — End: 2018-01-18

## 2018-01-18 MED ORDER — GENERIC EXTERNAL MEDICATION
1.00 | Status: DC
Start: 2018-01-18 — End: 2018-01-18

## 2018-01-18 MED ORDER — MELATONIN 3 MG PO TABS
3.00 | ORAL_TABLET | ORAL | Status: DC
Start: ? — End: 2018-01-18

## 2018-01-18 MED ORDER — PSYLLIUM 51.7 % PO PACK
1.00 | PACK | ORAL | Status: DC
Start: 2018-01-17 — End: 2018-01-18

## 2018-01-18 MED ORDER — GABAPENTIN 100 MG PO CAPS
200.00 | ORAL_CAPSULE | ORAL | Status: DC
Start: 2018-01-17 — End: 2018-01-18

## 2018-01-18 MED ORDER — CVS DRY SKIN CARE EX LOTN
1.00 | TOPICAL_LOTION | CUTANEOUS | Status: DC
Start: ? — End: 2018-01-18

## 2018-01-18 MED ORDER — RIVAROXABAN 20 MG PO TABS
20.00 | ORAL_TABLET | ORAL | Status: DC
Start: 2018-01-17 — End: 2018-01-18

## 2018-01-18 MED ORDER — DOCUSATE SODIUM 100 MG PO CAPS
100.00 | ORAL_CAPSULE | ORAL | Status: DC
Start: 2018-01-18 — End: 2018-01-18

## 2018-01-18 MED ORDER — SPIRONOLACTONE 25 MG PO TABS
25.00 | ORAL_TABLET | ORAL | Status: DC
Start: 2018-01-18 — End: 2018-01-18

## 2018-01-18 MED ORDER — HYDRALAZINE HCL 25 MG PO TABS
25.00 | ORAL_TABLET | ORAL | Status: DC
Start: 2018-01-17 — End: 2018-01-18

## 2018-01-18 MED ORDER — ONDANSETRON 4 MG PO TBDP
4.00 | ORAL_TABLET | ORAL | Status: DC
Start: ? — End: 2018-01-18

## 2018-01-18 MED ORDER — GENERIC EXTERNAL MEDICATION
Status: DC
Start: ? — End: 2018-01-18

## 2018-01-18 MED ORDER — ALBUTEROL SULFATE (2.5 MG/3ML) 0.083% IN NEBU
2.50 | INHALATION_SOLUTION | RESPIRATORY_TRACT | Status: DC
Start: ? — End: 2018-01-18

## 2018-01-18 MED ORDER — GUAIFENESIN 100 MG/5ML PO SYRP
100.00 | ORAL_SOLUTION | ORAL | Status: DC
Start: ? — End: 2018-01-18

## 2018-01-18 MED ORDER — POLYETHYLENE GLYCOL 3350 17 G PO PACK
17.00 | PACK | ORAL | Status: DC
Start: ? — End: 2018-01-18

## 2018-01-18 MED ORDER — VITAMIN B-12 1000 MCG PO TABS
1000.00 | ORAL_TABLET | ORAL | Status: DC
Start: 2018-01-18 — End: 2018-01-18

## 2018-01-18 MED ORDER — ATORVASTATIN CALCIUM 80 MG PO TABS
80.00 | ORAL_TABLET | ORAL | Status: DC
Start: 2018-01-17 — End: 2018-01-18

## 2018-01-22 ENCOUNTER — Ambulatory Visit: Payer: Medicaid Other | Attending: Physical Medicine and Rehabilitation | Admitting: Occupational Therapy

## 2018-01-22 ENCOUNTER — Encounter: Payer: Self-pay | Admitting: Occupational Therapy

## 2018-01-22 ENCOUNTER — Other Ambulatory Visit: Payer: Self-pay

## 2018-01-22 DIAGNOSIS — R278 Other lack of coordination: Secondary | ICD-10-CM | POA: Insufficient documentation

## 2018-01-22 DIAGNOSIS — R262 Difficulty in walking, not elsewhere classified: Secondary | ICD-10-CM | POA: Diagnosis present

## 2018-01-22 DIAGNOSIS — R41841 Cognitive communication deficit: Secondary | ICD-10-CM | POA: Diagnosis present

## 2018-01-22 DIAGNOSIS — M6281 Muscle weakness (generalized): Secondary | ICD-10-CM | POA: Insufficient documentation

## 2018-01-22 NOTE — Therapy (Signed)
[place] MAIN [facility] SERVICES [address] [place], [place], [address] Phone: [phone]   Fax:  [phone]  Occupational Therapy Evaluation  Patient Details  Name: VANNESSA GODOWN MRN: [id number] Date of Birth: 1972/05/11 Referring Provider: Sloan Leiter   Encounter Date: 01/22/2018  OT End of Session - 01/22/18 0953    Visit Number  1    Number of Visits  3    Authorization Type  Medicaid    OT Start Time  0907    OT Stop Time  1000    OT Time Calculation (min)  53 min    Activity Tolerance  Patient tolerated treatment well    Behavior During Therapy  [facility] for tasks assessed/performed       Past Medical History:  Diagnosis Date  . Anxiety   . Collagen vascular disease ([place])   . Hypertension     Past Surgical History:  Procedure Laterality Date  . ABDOMINAL HYSTERECTOMY    . BREAST BIOPSY Right 17+ years ago   negative Core Bx    There were no vitals filed for this visit.  Subjective Assessment - 01/22/18 0924    Subjective   Patient reports she has a poor memory the last couple months, she was unsure why she was in the hospital, later states, "I have a lesion on my spine".  "I think I am here about my legs"    Patient Stated Goals  "I want to be like I was and walk again like I was"    Currently in Pain?  No/denies    Multiple Pain Sites  No        [facility] OT Assessment - 01/22/18 0926      Assessment   Medical Diagnosis  Bil. lower ext weakness,  spinal cord dysfunction    Referring Provider  [place], C    Onset Date/Surgical Date  12/25/17    Hand Dominance  Right      Balance Screen   Has the patient fallen in the past 6 months  Yes    How many times?  no    Has the patient had a decrease in activity level because of a fear of falling?   No    Is the patient reluctant to leave their home because of a fear of falling?   No      Home  Environment   Family/patient expects to be discharged to:  Private residence     Living Arrangements  Parent    Available Help at Discharge  Family    Type of [place]  One level    Alternate Level Stairs - Number of Steps  2 steps to enter    Bathroom Shower/Tub  Tub/Shower unit    [place] - 4 wheels;Grab bars - tub/shower;Hand held shower head;Tub bench    Additional Comments  has a railing to help get in the house.     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    Leisure  word puzzles, spend time with grandson      ADL   Eating/Feeding  Independent    Grooming  Minimal assistance  assist with hair and makeup    Upper Body Bathing  Modified independent    Lower Body Bathing  Increased time    Upper Body Dressing  Independent    Lower Body Dressing  Increased time;Minimal assistance difficulty with laced shoes    Toilet Transfer  Modified independent    Toileting - Clothing Manipulation  Increased time    Toileting -  Hygiene  Modified Independent    Tub/Shower Transfer  Modified independent    ADL comments  Patient has difficulty with standing for periods of time to perform self care tasks, has to sit for most tasks, now using tub bench for transfer, cannot perform without bench.  Difficulty with tying shoes, applying makeup and doing her hair.  Reports her feet always feel cold.       IADL   Prior Level of Function Shopping  independent    Shopping  Needs to be accompanied on any shopping trip    Prior Level of Function Light Housekeeping  independent    Light Housekeeping  Needs help with all home maintenance tasks    Prior Level of Function Meal Prep  independent    Meal Prep  Needs to have meals prepared and served    Prior Level of Function Community Mobility  independent without assistive device    Community Mobility   Relies on family or friends for transportation    Prior Level of Function Medication Managment  independent    Medication Management  Is responsible for taking medication in correct dosages at correct time    Prior Level of Function Loss adjuster, chartered financial matters independently (budgets, writes checks, pays rent, bills goes to bank), collects and keeps track of income      Mobility   Mobility Status  History of falls;Needs assist    Mobility Status Comments  using rollator now, multiple falls prior to hospital admission      Written Expression   Dominant Hand  Right      Vision - History   Baseline Vision  Other (comment)    Additional Comments  Patient reports she needs glasses but does not have any      Activity Tolerance   Activity Tolerance  Tolerates < 10 min activity with changes in vital signs      Cognition   Overall Cognitive Status  Impaired/Different from baseline    Memory  Impaired    Memory Impairment  Decreased short term memory    Problem Solving  Impaired    Executive Function  Decision Making    Decision Making  Impaired    Cognition Comments  Patient reports memory issues have been going on about 2 months, difficulty recalling recent events, problem solving, making decisions.  Able to state 911 as an emergency number to call.       Sensation   Light Touch  Appears Intact    Stereognosis  Appears Intact    Hot/Cold  Appears Intact    Proprioception  Appears Intact    Additional Comments  Denies any numbness or tingling in hands but reports in both feet, right leg feels numb.        Coordination   Gross Motor Movements are Fluid and Coordinated  No LE impaired    Fine Motor Movements are Fluid and Coordinated  No    Coordination and Movement Description  rapid alternating movements impaired    Finger Nose Finger Test  impaired    9 Hole Peg Test  Right;Left    Right 9 Hole Peg Test  34 sec    Left 9 Hole  Peg Test  26 secs      ROM / Strength   AROM / PROM / Strength  AROM;Strength      AROM   Overall AROM   Deficits    Overall AROM Comments  RUE shoulder flexion limited to 80degrees, ABD 78 degrees, elbow, wrist and hand WFLS, LUE WNLs,      Strength   Overall Strength  Deficits    Overall Strength Comments  RUE strength shoulder 3-/5, elbow 4/5, LUE 4/5 overall.  LE weakness see PT evaluation for details.       Hand Function   Right Hand Grip (lbs)  40    Right Hand Lateral Pinch  12 lbs    Right Hand 3 Point Pinch  6 lbs    Left Hand Grip (lbs)  50    Left Hand Lateral Pinch  16 lbs    Left 3 point pinch  14 lbs        Patient reports decline in cognition over the last couple months, decreased short term memory, decreased problem solving abilities Patient has leg brace for left LE and use of rollator since hospital admission, was independent with mobility prior to hospitalization. Patient was able to complete light cleaning tasks, homemaking prior to hospitalization but required assist for heavy chores such as mopping the floor.                OT Education - 01/22/18 [phone]    Education Details  goals, POC    Person(s) Educated  Patient    Methods  Explanation    Comprehension  Verbalized understanding          OT Long Term Goals - 01/22/18 1859      OT LONG TERM GOAL #1   Title  Patient will demonstate shoe donning and doffing including tying with modified independence.     Baseline  unable to tie shoes at eval    Time  6    Period  Weeks    Status  New    Target Date  03/05/18      OT LONG TERM GOAL #2   Title  Patient to increase right grip by 5# to open jars and containers.     Baseline  difficulty at eval using right hand, 40#    Time  6    Period  Weeks    Status  New    Target Date  03/05/18      OT LONG TERM GOAL #3   Title  Patient will complete grooming at the sink in standing with applying makeup for up to 5 mins with supervision.       Baseline  unable at eval    Time  6    Period  Weeks    Status  New    Target Date  03/05/18      OT LONG TERM GOAL #4   Title  Patient will complete light meal prep with modified independence.     Baseline  unable at eval     Time  6    Period  Weeks    Status  New    Target Date  03/05/18      OT LONG TERM GOAL #5   Title  Patient will be independent with home exercise program for RUE weakness  and coordination    Baseline  no current program    Time  4    Period  Weeks    Status  New    Target Date  02/19/18            Plan - 01/22/18 0955    Clinical Impression Statement  Patient is a 52 yo female with history of lupus, admitted to hospital on December 25, 2017 with diagnosis of bilateral lower extremity weakness, spinal cord dysfunction and paraplegia incomplete.  Hospitalization at [facility] from 12/25/2017 and discharged January 17, 2018 after inpatient rehab stay.  She presents with lower extremity bilateral muscle weakness, RUE muscle weakness, lack of coordination, decreased memory, decreased cognition with problem solving abilities, decreased ability to perform basic self care and IADL tasks.  She would benefit from skilled OT to maximize safety and independence in daily tasks.     Occupational Profile and client history currently impacting functional performance  history of lupus, on disability, cannot drive currently and relying on others for transportation, co morbidities    Occupational performance deficits (Please refer to evaluation for details):  ADL's;IADL's;Social Participation    Rehab Potential  Good    Current Impairments/barriers affecting progress:  decreased ROM and strength in RUE, memory issues, transportation    OT Frequency  2x / week , request 3 initial visits from [facility] and patient will require more once 3 are complete   OT Duration  6 weeks    OT Treatment/Interventions  Self-care/ADL training;Cryotherapy;Therapeutic exercise;DME and/or AE  instruction;Functional Mobility Training;Cognitive remediation/compensation;Balance training;Neuromuscular education;Manual Therapy;Moist Heat;Therapeutic activities;Patient/family education    Clinical Decision Making  Limited treatment options, no task modification necessary    Consulted and Agree with Plan of Care  Patient       Patient will benefit from skilled therapeutic intervention in order to improve the following deficits and impairments:  Abnormal gait, Decreased balance, Decreased endurance, Decreased mobility, Difficulty walking, Decreased cognition, Decreased range of motion, Decreased activity tolerance, Decreased coordination, Decreased strength, Impaired UE functional use  Visit Diagnosis: Muscle weakness (generalized)  Other lack of coordination    Problem List Patient Active Problem List   Diagnosis Date Noted  . Chest pain 03/30/2017   Stehanie Ekstrom T Tomasita Morrow, OTR/L, CLT  Chantrice Hagg 01/22/2018, 7:03 PM  [place] MAIN [facility] SERVICES [address] [place], [place], [address] Phone: [phone]   Fax:  [phone]  Name: SOFIJA ANTWI MRN: [id number] Date of Birth: [birth year]

## 2018-01-23 ENCOUNTER — Ambulatory Visit: Payer: Medicaid Other | Admitting: Speech Pathology

## 2018-01-23 ENCOUNTER — Other Ambulatory Visit: Payer: Self-pay

## 2018-01-23 ENCOUNTER — Encounter: Payer: Self-pay | Admitting: Physical Therapy

## 2018-01-23 ENCOUNTER — Ambulatory Visit: Payer: Medicaid Other | Admitting: Physical Therapy

## 2018-01-23 DIAGNOSIS — R262 Difficulty in walking, not elsewhere classified: Secondary | ICD-10-CM

## 2018-01-23 DIAGNOSIS — M6281 Muscle weakness (generalized): Secondary | ICD-10-CM | POA: Diagnosis not present

## 2018-01-23 DIAGNOSIS — R41841 Cognitive communication deficit: Secondary | ICD-10-CM

## 2018-01-23 DIAGNOSIS — R278 Other lack of coordination: Secondary | ICD-10-CM

## 2018-01-23 NOTE — Therapy (Signed)
[place] MAIN [facility] SERVICES [address] [place], [place], [address] Phone: [phone]   Fax:  [phone]  Physical Therapy Evaluation  Patient Details  Name: Valerie Bell MRN: [id number] Date of Birth: 14-Apr-1966 Referring Provider: Marshell Levan A    Encounter Date: 01/23/2018  PT End of Session - 01/23/18 1034    Visit Number  1    Date for PT Re-Evaluation  03/20/18    Authorization Type  medicaid    PT Start Time  1015    PT Stop Time  1115    PT Time Calculation (min)  60 min    Equipment Utilized During Treatment  Gait belt;Other (comment) L long leg brace    Activity Tolerance  Patient tolerated treatment well    Behavior During Therapy  WFL for tasks assessed/performed       Past Medical History:  Diagnosis Date  . Anxiety   . Collagen vascular disease ([place])   . Hypertension     Past Surgical History:  Procedure Laterality Date  . ABDOMINAL HYSTERECTOMY    . BREAST BIOPSY Right 17+ years ago   negative Core Bx    There were no vitals filed for this visit.   Subjective Assessment - 01/23/18 1018    Subjective  Patient has been falling and she does not know why.     Pertinent History  Valerie Bell is a 52 y.o. female who presented to the emergency department complaining of numbness in the left rib area. .  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 She is using a rollator and was independent in ambulation without AD prior to hospitilization. She was living with her mom prior to hospitilization. 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     Patient Stated Goals  Patient wants to be able to walk longer distances and get her legs stronger.    Currently in Pain?  No/denies    Multiple Pain Sites  No         [facility] PT Assessment -  01/23/18 1026      Assessment   Medical Diagnosis  Myelopathy BLE    Referring Provider  CLEVELAND, CHRISTINE A     Onset Date/Surgical Date  12/25/17    Hand Dominance  Right    Prior Therapy  yes      Precautions   Precautions  Fall    Precaution Comments  -- LLB hinged      Restrictions   Weight Bearing Restrictions  Yes      Balance Screen   Has the patient fallen in the past 6 months  Yes    How many times?  -- several    Has the patient had a decrease in activity level because of a fear of falling?   Yes    Is the patient reluctant to leave their home because of a fear of falling?   Yes      [place] residence    Living Arrangements  Parent    Available Help at Discharge  Family    Type of [place] to enter    Entrance Stairs-Number of Steps  -- 2    Entrance Stairs-Rails  Right;Left;Cannot reach both    Home Layout  One level    [place] - 4 wheels;Bedside commode;Shower seat;Grab bars - tub/shower      Prior Function   Level of Independence  Independent with household mobility with device    Vocation  On disability    Leisure  word puzzles      Cognition   Overall Cognitive Status  No family/caregiver present to determine baseline cognitive functioning    Attention  Focused    Memory  Impaired    Memory Impairment  Decreased long term memory;Decreased short term memory    Problem Solving  Impaired        POSTURE: WNL   PROM/AROM: WNL BUE and BLE  RUE -3/5 shoulder flex and abd   STRENGTH:  Graded on a 0-5 scale Muscle Group Left Right                          Hip Flex 3/5 3+/5  Hip Abd -3/5 3/5  Hip Add 1/5 2/5  Hip Ext 3/5 3/5  Hip IR/ER    Knee Flex 5/5 5/5  Knee Ext 5/5 5/5  Ankle DF 5/5 5/5  Ankle PF 5/5 5/5   SENSATION: intact UE and LE    FUNCTIONAL MOBILITY: Slow mobility with rolling and supine to sit, needs definite UE support with sit to stand  transfers   BALANCE: Standing Dynamic Balance  Normal Stand independently unsupported, able to weight shift and cross midline maximally   Good Stand independently unsupported, able to weight shift and cross midline moderately   Good-/Fair+ Stand independently unsupported, able to weight shift across midline minimally   Fair Stand independently unsupported, weight shift, and reach ipsilaterally, loss of balance when crossing midline x  Poor+ Able to stand with Min A and reach ipsilaterally, unable to weight shift   Poor Able to stand with Mod A and minimally reach ipsilaterally, unable to cross midline.    Static Standing Balance  Normal Able to maintain standing balance against maximal resistance   Good Able to maintain standing balance against moderate resistance   Good-/Fair+ Able to maintain standing balance against minimal resistance   Fair Able to stand unsupported without UE support and without LOB for 1-2 min x  Fair- Requires Min A and UE support to maintain standing without loss of balance   Poor+ Requires mod A and UE support to maintain standing without loss of balance   Poor Requires max A and UE support to maintain standing balance without loss       GAIT: Patient has decreased gait speed with rollator, L long leg hinged brace,  poor weight shift and poor motor control   OUTCOME MEASURES: TEST Outcome Interpretation  5 times sit<>stand 50.19sec >4 yo, >15 sec indicates increased risk for falls  10 meter walk test       .49          m/s <1.0 m/s indicates increased risk for falls; limited community ambulator  Timed up and Go   34.20              sec <14 sec indicates increased risk for falls  LEFS         51/80        80/80 is normal                      Objective measurements completed on examination: See  above findings.              PT Education - 01/23/18 1033    Education Details  plan of care    Person(s) Educated  Patient    Methods   Explanation    Comprehension  Verbalized understanding       PT Short Term Goals - 01/23/18 1108      PT SHORT TERM GOAL #1   Title  Patient will be independent in home exercise program to improve strength/mobility for better functional independence with ADLs.    Time  4    Period  Weeks    Status  New    Target Date  02/20/18      PT SHORT TERM GOAL #2   Title   Patient (< 33 years old) will complete five times sit to stand test in < 10 seconds indicating an increased LE strength and improved balance.    Baseline  50.19 sec    Time  4    Period  Weeks    Status  New    Target Date  02/20/18        PT Long Term Goals - 01/23/18 1125      PT LONG TERM GOAL #1   Title  Patient will increase 10 meter walk test to >1.45m/s as to improve gait speed for better community ambulation and to reduce fall risk.    Baseline  . 49 m/sec    Time  8    Period  Weeks    Status  New    Target Date  03/20/18      PT LONG TERM GOAL #2   Title  Patient will reduce timed up and go to <11 seconds to reduce fall risk and demonstrate improved transfer/gait ability.    Baseline  34.20 sec    Time  8    Period  Weeks    Status  New    Target Date  03/20/18      PT LONG TERM GOAL #3   Title   Patient will be independent with ascend/descend 2 steps using single UE in step over step pattern without LOB.    Baseline  unable to ambulate steps without BUE support and railings    Time  8    Period  Weeks    Status  New    Target Date  03/20/18      PT LONG TERM GOAL #4   Title  Patient will increase lower extremity functional scale to >60/80 to demonstrate improved functional mobility and increased tolerance with ADLs    Baseline  51/80    Time  8    Period  Weeks    Status  New    Target Date  03/20/18             Plan - 01/23/18 1057    Clinical Impression Statement  Patient has new onset of bliateral leg weakness with unknown origin. She has had many falls and has memory deifcit .  She was in [place] from 12-25-17 to 01-02-18. She ambulates with rollator and left long leg brace with decreased gait speed and is  unable to ambulate long distances. She has decreased transfers with definite need of UE support, decreased static and dynamic standing balance. She will benefit from skilled PT to improve falls risk, improve gait and overall strength and mobility.     History and Personal Factors relevant to plan of care:  falls  Clinical Presentation  Evolving    Clinical Decision Making  Moderate    Rehab Potential  Good    PT Frequency  2x / week    PT Duration  8 weeks    PT Treatment/Interventions  Aquatic Therapy;Balance training;Neuromuscular re-education;Therapeutic activities;Functional mobility training;Stair training;Therapeutic exercise;Patient/family education;Manual techniques;Gait training    PT Next Visit Plan  Strengthening and gait training    PT Home Exercise Plan  next visit     Consulted and Agree with Plan of Care  Patient       Patient will benefit from skilled therapeutic intervention in order to improve the following deficits and impairments:  Abnormal gait, Decreased balance, Decreased endurance, Decreased mobility, Difficulty walking, Decreased activity tolerance, Decreased coordination, Decreased safety awareness, Decreased strength  Visit Diagnosis: Muscle weakness (generalized) - Plan: PT plan of care cert/re-cert  Difficulty in walking, not elsewhere classified - Plan: PT plan of care cert/re-cert  Other lack of coordination - Plan: PT plan of care cert/re-cert     Problem List Patient Active Problem List   Diagnosis Date Noted  . Chest pain 03/30/2017    Alanson Puls, [place] DPT 01/23/2018, 11:41 AM  [place] MAIN [facility] SERVICES [address] [place], [place], [address] Phone: [phone]   Fax:  [phone]  Name: Valerie Bell MRN: [id number] Date of Birth: September 01, 1965

## 2018-01-24 ENCOUNTER — Other Ambulatory Visit: Payer: Self-pay

## 2018-01-24 ENCOUNTER — Encounter: Payer: Self-pay | Admitting: Speech Pathology

## 2018-01-24 NOTE — Therapy (Signed)
[place] MAIN [facility] SERVICES [address] [place], [place], [address] Phone: [phone]   Fax:  [phone]  Speech Language Pathology Evaluation  Patient Details  Name: Valerie Bell MRN: [id number] Date of Birth: 31-Oct-1965 Referring Provider: Marshell Levan A    Encounter Date: 01/23/2018  End of Session - 01/24/18 1018    Visit Number  1    Number of Visits  4    Date for SLP Re-Evaluation  02/23/18    SLP Start Time  0900    SLP Stop Time   1000    SLP Time Calculation (min)  60 min    Activity Tolerance  Patient tolerated treatment well       Past Medical History:  Diagnosis Date  . Anxiety   . Collagen vascular disease ([place])   . Hypertension     Past Surgical History:  Procedure Laterality Date  . ABDOMINAL HYSTERECTOMY    . BREAST BIOPSY Right 17+ years ago   negative Core Bx    There were no vitals filed for this visit.      SLP Evaluation [facility] - 01/24/18 0001      SLP Visit Information   SLP Received On  01/23/18    Referring Provider  CLEVELAND, CHRISTINE A     Onset Date  12/25/2017    Medical Diagnosis  Multifocal findings on MRI Brain 7/9:       Subjective   Subjective  "Can't remember"    Patient/Family Stated Goal  Functional memory skills      Pain Assessment   Currently in Pain?  No/denies      General Information   HPI  Patient is a 52 year old woman with history of lupus, admitted to hospital on 12/25/2017 with diagnosis of bilateral lower extremity weakness, spinal cord dysfunction and paraplegia incomplete.  Multifocal findings on MRI Brain 12/25/2017: MRI brain showed multiple foci with restricted diffusion, primarily cortically-based, suggestive of embolic process. These may represent independent process from the spinal lesion.  Hospitalization at [facility] from 12/25/2017 and discharged 01/17/2018 after inpatient rehab stay.   She presents with lower extremity bilateral muscle weakness, RUE muscle  weakness, lack of coordination, decreased memory, decreased cognition with problem solving abilities, decreased ability to perform basic self-care and IADL tasks.  Patient received speech therapy services while at [facility] for cognitive deficits.        Prior Functional Status   Cognitive/Linguistic Baseline  Within functional limits      Cognition   Overall Cognitive Status  Impaired/Different from baseline      Auditory Comprehension   Overall Auditory Comprehension  Appears within functional limits for tasks assessed      Reading Comprehension   Reading Status  Within funtional limits      Expression   Primary Mode of Expression  Verbal      Verbal Expression   Overall Verbal Expression  Appears within functional limits for tasks assessed      Written Expression   Written Expression  Within Functional Limits      Oral Motor/Sensory Function   Overall Oral Motor/Sensory Function  Appears within functional limits for tasks assessed      Motor Speech   Overall Motor Speech  Impaired    Respiration  Within functional limits    Phonation  Hoarse;Other (comment)    Resonance  Within functional limits    Articulation  Within functional limitis    Intelligibility  Intelligible  Phonation  Impaired    Vocal Abuses  Habitual Hyperphonia    Tension Present  Jaw;Neck;Shoulder    Volume  Appropriate      Standardized Assessments   Standardized Assessments   Cognitive Linguistic Quick Test        Cognitive Linguistic Quick Test The Cognitive Linguistic Quick Test (CLQT) was administered to assess the relative status of five cognitive domains: attention, memory, language, executive functioning, and visuospatial skills. Scores from 10 tasks were used to estimate severity ratings (for age groups 18-69 years and 70-89 years) for each domain, a clock drawing task, as well as an overall composite severity rating of cognition.    Task    Score  Criterion Cut Scores Personal  Facts  7/8   8  Symbol Cancellation    12/12   11 Confrontation Naming    10/10   10 Clock Drawing     12/13   12 Story Retelling       4/10   6 Symbol Trails      3/10   9 Improves to 7 with self-correction Generative Naming      3/9   5 Design Memory  5/6   5 Mazes        5/8   7 Design Generation    4/13   6 Improves to 9 given extra time   Cognitive Domain  Composite Score Severity Rating Attention   159/215  Mild  Memory   126/185  Moderate Executive Function  15/40   Severe Language   24/37   Moderate Visuospatial Skills  69/105   Mild Clock Drawing   12/13   WNL  Composite Severity Rating Moderate   SLP Education - 01/24/18 1017    Education Details  Plan of care    Methods  Explanation    Comprehension  Verbalized understanding         SLP Long Term Goals - 01/24/18 1022      SLP LONG TERM GOAL #1   Title  Patient will demonstrate functional cognitive-communication skills for independent completion of personal responsibilities and leisure activities.    Baseline  Patient is not independent    Time  4    Period  Weeks    Status  New    Target Date  02/23/18      SLP LONG TERM GOAL #2   Title  Patient will demonstrate functional use of external memory/organization aids.    Baseline  Patient does not have functional cognitive compensatory strategies.    Time  4    Period  Weeks    Status  New    Target Date  02/23/18      SLP LONG TERM GOAL #3   Title  Patient will participate in developing functional external memory/organization aids.    Baseline  Patient does not have functional cognitive compensatory strategies.    Time  4    Period  Weeks    Status  New    Target Date  02/23/18       Plan - 01/24/18 1020    Clinical Impression Statement  This 52 year old woman, with multifocal findings on MRI Brain 12/25/2017, is presenting with moderate cognitive communication impairment characterized by impairment of attention, memory, executive functions,  language, and visuospatial skills.  The results of the Cognitive Linguistic Quick Test (CLQT) indicate a composite severity rating of moderate.  The patient scored with mild impairment in the realms of attention and visuospatial skills;  moderate impairment in the realms of memory and language, and severe impairment for executive function.   The patient's severity rating for CLQT domain of executive functions improved from severe to WNL when given support, including extra time, encouragement to continue, and encouraged to identify and self-correct errors.  The patient demonstrates functional reading and writing for use with compensatory strategies.  The patient has funding for limited therapy (PT/OT/ST) and significant physical needs.  Patient agrees with plan to see patient for up to 3 sessions to develop and train functional external memory/organizational system.    Speech Therapy Frequency  1x /week    Duration  4 weeks    Treatment/Interventions  Compensatory techniques;Cognitive reorganization;Patient/family education;SLP instruction and feedback    Potential to Achieve Goals  Good    Potential Considerations  Ability to learn/carryover information;Pain level;Family/community support;Co-morbidities;Previous level of function;Cooperation/participation level;Severity of impairments;Medical prognosis    SLP Home Exercise Plan  TBD    Consulted and Agree with Plan of Care  Patient       Patient will benefit from skilled therapeutic intervention in order to improve the following deficits and impairments:   Cognitive communication deficit - Plan: SLP plan of care cert/re-cert    Problem List Patient Active Problem List   Diagnosis Date Noted  . Chest pain 03/30/2017   Leroy Sea, MS/CCC- SLP  [name] 01/24/2018, 10:29 AM  [place] MAIN [facility] SERVICES [address] [place], [place], [address] Phone: [phone]   Fax:   [phone]  Name: Valerie Bell MRN: [id number] Date of Birth: 1965/09/07

## 2018-01-29 ENCOUNTER — Encounter: Payer: Self-pay | Admitting: Occupational Therapy

## 2018-01-29 ENCOUNTER — Ambulatory Visit: Payer: Medicaid Other | Admitting: Occupational Therapy

## 2018-01-29 ENCOUNTER — Ambulatory Visit: Payer: Medicaid Other | Admitting: Speech Pathology

## 2018-01-29 DIAGNOSIS — R41841 Cognitive communication deficit: Secondary | ICD-10-CM

## 2018-01-29 DIAGNOSIS — R278 Other lack of coordination: Secondary | ICD-10-CM

## 2018-01-29 DIAGNOSIS — M6281 Muscle weakness (generalized): Secondary | ICD-10-CM

## 2018-01-29 NOTE — Therapy (Signed)
[place] MAIN [facility] SERVICES [address] [place], [place], [address] Phone: [phone]   Fax:  [phone]  Occupational Therapy Treatment  Patient Details  Name: Valerie Bell MRN: [id number] Date of Birth: Oct 17, 1965 Referring Provider: Marshell Levan A    Encounter Date: 01/29/2018  OT End of Session - 01/29/18 1558    Visit Number  2    Number of Visits  3    Authorization Type  Medicaid    OT Start Time  7628    OT Stop Time  1630    OT Time Calculation (min)  45 min    Activity Tolerance  Patient tolerated treatment well    Behavior During Therapy  [facility] for tasks assessed/performed       Past Medical History:  Diagnosis Date  . Anxiety   . Collagen vascular disease ([place])   . Hypertension     Past Surgical History:  Procedure Laterality Date  . ABDOMINAL HYSTERECTOMY    . BREAST BIOPSY Right 17+ years ago   negative Core Bx    There were no vitals filed for this visit.  Subjective Assessment - 01/29/18 1554    Subjective   Pt. reports that her right leg feels stiff today.    Patient Stated Goals  "I want to be like I was and walk again like I was"    Currently in Pain?  No/denies      OT TREATMENT    Neuro muscular re-education:  Pt. worked on grasping one inch resistive cubes alternating thumb opposition to the tip of the 2nd through 5th digits. The board was positioned at a vertical angle. Pt. worked on pressing them back into place while isolating 2nd through 5th digits. Pt. Performed right hand [place] tasks using the grooved pegboard. Pt. worked on grasping the grooved pegs from a horizontal position, and moving the pegs to a vertical position in the hand to prepare for placing them in the grooved slot. Pt. worked on untying knots using bilateral hand coordination skills. Pt. Worked alternating bilateral hand coordination flipping cards with her right hand. Pt. worked Marine scientist, and recalling sequences of a series  of , 4, and 5 cards.  Therapeutic Exercise:  Pt. worked on right pinch strengthening in the left hand for lateral, and 3pt. pinch using yellow, red, green, and blue resistive clips. Pt. worked on placing the clips at various vertical and horizontal angles. Tactile and verbal cues were required for eliciting the desired movement.                         OT Education - 01/29/18 1557    Education Details  Pt. education was provided about UE strength.     Person(s) Educated  Patient    Methods  Explanation    Comprehension  Verbalized understanding          OT Long Term Goals - 01/22/18 1859      OT LONG TERM GOAL #1   Title  Patient will demonstate shoe donning and doffing including tying with modified independence.     Baseline  unable to tie shoes at eval    Time  6    Period  Weeks    Status  New    Target Date  03/05/18      OT LONG TERM GOAL #2   Title  Patient to increase right grip by 5# to open jars and containers.  Baseline  difficulty at eval using right hand, 40#    Time  6    Period  Weeks    Status  New    Target Date  03/05/18      OT LONG TERM GOAL #3   Title  Patient will complete grooming at the sink in standing with applying makeup for up to 5 mins with supervision.      Baseline  unable at eval    Time  6    Period  Weeks    Status  New    Target Date  03/05/18      OT LONG TERM GOAL #4   Title  Patient will complete light meal prep with modified independence.     Baseline  unable at eval     Time  6    Period  Weeks    Status  New    Target Date  03/05/18      OT LONG TERM GOAL #5   Title  Patient will be independent with home exercise program for RUE weakness and coordination    Baseline  no current program    Time  4    Period  Weeks    Status  New    Target Date  02/19/18            Plan - 01/29/18 1558    Clinical Impression Statement  Pt. reports that her right leg is more stiff over the past couple of  days than it has been since she has gotten out of the hospital. Pt. continues to work on improving RUE strength, and [facility] skills. Pt. requires visual demonstration, verbal cues, and assist for the movement patterns with each activity.     Occupational Profile and client history currently impacting functional performance  history of lupus, on disability, cannot drive currently and relying on others for transportation, co morbidities    Occupational performance deficits (Please refer to evaluation for details):  ADL's;IADL's;Social Participation    Rehab Potential  Good    Current Impairments/barriers affecting progress:  decreased ROM and strength in RUE, memory issues, transportation    OT Frequency  2x / week    OT Duration  6 weeks    OT Treatment/Interventions  Self-care/ADL training;Cryotherapy;Therapeutic exercise;DME and/or AE instruction;Functional Mobility Training;Cognitive remediation/compensation;Balance training;Neuromuscular education;Manual Therapy;Moist Heat;Therapeutic activities;Patient/family education    Clinical Decision Making  Limited treatment options, no task modification necessary    Consulted and Agree with Plan of Care  Patient       Patient will benefit from skilled therapeutic intervention in order to improve the following deficits and impairments:  Abnormal gait, Decreased balance, Decreased endurance, Decreased mobility, Difficulty walking, Decreased cognition, Decreased range of motion, Decreased activity tolerance, Decreased coordination, Decreased strength, Impaired UE functional use  Visit Diagnosis: Muscle weakness (generalized)  Other lack of coordination    Problem List Patient Active Problem List   Diagnosis Date Noted  . Chest pain 03/30/2017    Harrel Carina, MS, OTR/L 01/29/2018, 4:18 PM  [place] MAIN [facility] SERVICES [address] [place], [place], [address] Phone: [phone]   Fax:   [phone]  Name: Valerie Bell MRN: [id number] Date of Birth: Jun 25, 1965

## 2018-01-30 ENCOUNTER — Ambulatory Visit: Payer: Medicaid Other

## 2018-01-30 ENCOUNTER — Ambulatory Visit: Payer: Medicaid Other | Admitting: Speech Pathology

## 2018-01-30 ENCOUNTER — Encounter: Payer: Self-pay | Admitting: Speech Pathology

## 2018-01-30 ENCOUNTER — Ambulatory Visit: Payer: Medicaid Other | Admitting: Occupational Therapy

## 2018-01-30 ENCOUNTER — Encounter: Payer: Self-pay | Admitting: Occupational Therapy

## 2018-01-30 VITALS — BP 143/97 | HR 70

## 2018-01-30 DIAGNOSIS — M6281 Muscle weakness (generalized): Secondary | ICD-10-CM | POA: Diagnosis not present

## 2018-01-30 DIAGNOSIS — R262 Difficulty in walking, not elsewhere classified: Secondary | ICD-10-CM

## 2018-01-30 DIAGNOSIS — R278 Other lack of coordination: Secondary | ICD-10-CM

## 2018-01-30 NOTE — Therapy (Signed)
[place] MAIN [facility] SERVICES [address] [place], [place], [address] Phone: [phone]   Fax:  [phone]  Physical Therapy Treatment  Patient Details  Name: Valerie Bell MRN: [id number] Date of Birth: 04-26-1966 Referring Provider: Marshell Levan A    Encounter Date: 01/30/2018  PT End of Session - 01/30/18 1025    Visit Number  2    Date for PT Re-Evaluation  03/20/18    Authorization Type  medicaid    PT Start Time  1015    PT Stop Time  1100    PT Time Calculation (min)  45 min    Equipment Utilized During Treatment  Gait belt;Other (comment)   L long leg brace   Activity Tolerance  Patient tolerated treatment well    Behavior During Therapy  WFL for tasks assessed/performed       Past Medical History:  Diagnosis Date  . Anxiety   . Collagen vascular disease ([place])   . Hypertension     Past Surgical History:  Procedure Laterality Date  . ABDOMINAL HYSTERECTOMY    . BREAST BIOPSY Right 17+ years ago   negative Core Bx    Vitals:   01/30/18 1019  BP: (!) 143/97  Pulse: 70  SpO2: 99%    Subjective Assessment - 01/30/18 1018    Subjective  Pt reports no further falls since her last therapy appointment. She denies pain today but is complianing of RLE numbness. She forgot to take her BP meds this AM.    Pertinent History   Valerie Bell is a 52 y.o. female who presented to the emergency department complaining of numbness in the left rib area. .  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 She is using a rollator and was independent in ambulation without AD prior to hospitilization. She was living with her mom prior to hospitilization. 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     Patient Stated Goals  Patient wants to be able to walk  longer distances and get her legs stronger.    Currently in Pain?  No/denies          TREATMENT  Ther-ex  NuStep L1 x 4 minutes for warm-up (unbilled); Quantum leg pres 60# x 15, 65# x 15, 75# x 10, pt unable to complete an additional rep during last set (yellow tband around knees during third step for active abduction and to prevent valgus); Seated marching with yellow tband around knees x 15 bilateral (added to HEP); Seated clams with yellow tband around knees x 15 (added to HEP); Seated LAQ with yellow tband around ankles x 15 bilateral (added to HEP);  Neuromuscular Re-education  Airex balance with toe taps to 4" step alternating LE x 10 each, faded UE support until pt is completing without UE support; Static balance on 1/2 bolster (flat side up) with no UE support 30s x 2; Standing on 1/2 bolster (Flat side up) heel/toe rock x15 with no UE hold for balance but regular assist from therapist due to almost constant posterior LOB.                       PT Education - 01/30/18 1024    Education Details  exercise form/technique, HEP    Person(s)  Educated  Patient    Methods  Explanation;Demonstration;Verbal cues;Handout    Comprehension  Verbalized understanding       PT Short Term Goals - 01/23/18 1108      PT SHORT TERM GOAL #1   Title  Patient will be independent in home exercise program to improve strength/mobility for better functional independence with ADLs.    Time  4    Period  Weeks    Status  New    Target Date  02/20/18      PT SHORT TERM GOAL #2   Title   Patient (< 21 years old) will complete five times sit to stand test in < 10 seconds indicating an increased LE strength and improved balance.    Baseline  50.19 sec    Time  4    Period  Weeks    Status  New    Target Date  02/20/18        PT Long Term Goals - 01/23/18 1125      PT LONG TERM GOAL #1   Title  Patient will increase 10 meter walk test to >1.18m/s as to improve gait  speed for better community ambulation and to reduce fall risk.    Baseline  . 49 m/sec    Time  8    Period  Weeks    Status  New    Target Date  03/20/18      PT LONG TERM GOAL #2   Title  Patient will reduce timed up and go to <11 seconds to reduce fall risk and demonstrate improved transfer/gait ability.    Baseline  34.20 sec    Time  8    Period  Weeks    Status  New    Target Date  03/20/18      PT LONG TERM GOAL #3   Title   Patient will be independent with ascend/descend 2 steps using single UE in step over step pattern without LOB.    Baseline  unable to ambulate steps without BUE support and railings    Time  8    Period  Weeks    Status  New    Target Date  03/20/18      PT LONG TERM GOAL #4   Title  Patient will increase lower extremity functional scale to >60/80 to demonstrate improved functional mobility and increased tolerance with ADLs    Baseline  51/80    Time  8    Period  Weeks    Status  New    Target Date  03/20/18            Plan - 01/30/18 1025    Clinical Impression Statement  Pt demonstrates good motivation during session today. She does move slowly which limits the overall number of different exercises she can perform. Pt was provided with a seated strengthening home program. Will continue to progress strengthening exercises and assess pt for appropriateness for home balance program at follow-up sessions. Pt will benefit from PT services to address deficits in strength, balance, and mobility in order to return to full function at home.     Rehab Potential  Good    PT Frequency  2x / week    PT Duration  8 weeks    PT Treatment/Interventions  Aquatic Therapy;Balance training;Neuromuscular re-education;Therapeutic activities;Functional mobility training;Stair training;Therapeutic exercise;Patient/family education;Manual techniques;Gait training    PT Next Visit Plan  Strengthening and gait training    PT Home Exercise Plan  --  Consulted and  Agree with Plan of Care  Patient       Patient will benefit from skilled therapeutic intervention in order to improve the following deficits and impairments:  Abnormal gait, Decreased balance, Decreased endurance, Decreased mobility, Difficulty walking, Decreased activity tolerance, Decreased coordination, Decreased safety awareness, Decreased strength  Visit Diagnosis: Muscle weakness (generalized)  Difficulty in walking, not elsewhere classified     Problem List Patient Active Problem List   Diagnosis Date Noted  . Chest pain 03/30/2017   Phillips Grout PT, DPT, GCS  Valerie Bell 01/30/2018, 11:33 AM  [place] MAIN [facility] SERVICES [address] [place], [place], [address] Phone: [phone]   Fax:  [phone]  Name: Valerie Bell MRN: [id number] Date of Birth: 06-19-1966

## 2018-01-30 NOTE — Patient Instructions (Signed)
Access Code: [email or web address]  URL: [email or web address]  Date: 01/30/2018  Prepared by: Roxana Hires   Exercises  Seated March with Resistance - 10 reps - 2 sets - 3 seconds hold - 2x daily - 7x weekly  Seated Hip Abduction with Resistance - 10 reps - 2 sets - 3 seconds hold - 2x daily - 7x weekly  Seated Knee Extension with Resistance - 10 reps - 2 sets - 3 seconds hold - 2x daily - 7x weekly

## 2018-01-30 NOTE — Therapy (Signed)
[place] MAIN [facility] SERVICES [address] [place], [place], [address] Phone: [phone]   Fax:  [phone]  Occupational Therapy Treatment  Patient Details  Name: Valerie Bell MRN: [id number] Date of Birth: 1966/03/22 Referring Provider: Marshell Levan A    Encounter Date: 01/30/2018  OT End of Session - 01/30/18 1114    Visit Number  3    Number of Visits  3    Authorization Type  Medicaid    OT Start Time  1100    OT Stop Time  1145    OT Time Calculation (min)  45 min    Activity Tolerance  Patient tolerated treatment well    Behavior During Therapy  [facility] for tasks assessed/performed       Past Medical History:  Diagnosis Date  . Anxiety   . Collagen vascular disease ([place])   . Hypertension     Past Surgical History:  Procedure Laterality Date  . ABDOMINAL HYSTERECTOMY    . BREAST BIOPSY Right 17+ years ago   negative Core Bx    There were no vitals filed for this visit.  Subjective Assessment - 01/30/18 1113    Subjective   Pt. reports feeling better than yesterday.    Patient Stated Goals  "I want to be like I was and walk again like I was"    Currently in Pain?  No/denies       OT TREATMENT     Neuro muscular re-education:  Pt. Education was provided about [facility] skills. Pt. was provided with a visual handout, and HEP.  Therapeutic Exercise:  Pt. Performed hand strengthening with pink theraputty. Pt. required cues for proper technique. Pt. worked on gross grip loop, lateral pinch, 3pt. pinch, gross digit extension, digit extension table spread, digit abduction loop, single digit extension loop, thumb opposition, and lumbical ex,  Pt. required verbal and tactile cues for proper technique. Pt. Was provided with a HEP, and visual handout.                             OT Education - 01/30/18 1113    Education Details  Pt. education was provided about HEP for RUE strength, [facility]    Person(s) Educated  Patient    Methods  Explanation    Comprehension  Verbalized understanding          OT Long Term Goals - 01/22/18 1859      OT LONG TERM GOAL #1   Title  Patient will demonstate shoe donning and doffing including tying with modified independence.     Baseline  unable to tie shoes at eval    Time  6    Period  Weeks    Status  New    Target Date  03/05/18      OT LONG TERM GOAL #2   Title  Patient to increase right grip by 5# to open jars and containers.     Baseline  difficulty at eval using right hand, 40#    Time  6    Period  Weeks    Status  New    Target Date  03/05/18      OT LONG TERM GOAL #3   Title  Patient will complete grooming at the sink in standing with applying makeup for up to 5 mins with supervision.      Baseline  unable at eval    Time  6  Period  Weeks    Status  New    Target Date  03/05/18      OT LONG TERM GOAL #4   Title  Patient will complete light meal prep with modified independence.     Baseline  unable at eval     Time  6    Period  Weeks    Status  New    Target Date  03/05/18      OT LONG TERM GOAL #5   Title  Patient will be independent with home exercise program for RUE weakness and coordination    Baseline  no current program    Time  4    Period  Weeks    Status  New    Target Date  02/19/18            Plan - 01/30/18 1116    Clinical Impression Statement  Pt. reports that her right LE is not as stiff today as it has been. Pt. requires verbal cues, and visual demonstration, for proper movement patterns with the theraputty, as well as placement of the putty.  Pt. was provided with a HEP, and a visual handout for fine motor coordination tasks. Pt. continues to work on improving RUE strength, and [facility] skills for iimproved functional hand use, and to increase engagement in ADLS, and IADLs.    Occupational Profile and client history currently impacting functional performance  history of lupus, on  disability, cannot drive currently and relying on others for transportation, co morbidities    Occupational performance deficits (Please refer to evaluation for details):  ADL's;IADL's;Social Participation    Rehab Potential  Good    Current Impairments/barriers affecting progress:  decreased ROM and strength in RUE, memory issues, transportation    OT Frequency  2x / week    OT Duration  6 weeks    OT Treatment/Interventions  Self-care/ADL training;Cryotherapy;Therapeutic exercise;DME and/or AE instruction;Functional Mobility Training;Cognitive remediation/compensation;Balance training;Neuromuscular education;Manual Therapy;Moist Heat;Therapeutic activities;Patient/family education    Clinical Decision Making  Limited treatment options, no task modification necessary    Consulted and Agree with Plan of Care  Patient       Patient will benefit from skilled therapeutic intervention in order to improve the following deficits and impairments:  Abnormal gait, Decreased balance, Decreased endurance, Decreased mobility, Difficulty walking, Decreased cognition, Decreased range of motion, Decreased activity tolerance, Decreased coordination, Decreased strength, Impaired UE functional use  Visit Diagnosis: Muscle weakness (generalized)  Other lack of coordination    Problem List Patient Active Problem List   Diagnosis Date Noted  . Chest pain 03/30/2017    Valerie Bell 01/30/2018, 11:44 AM  [place] MAIN [facility] SERVICES [address] [place], [place], [address] Phone: [phone]   Fax:  [phone]  Name: Valerie Bell MRN: [id number] Date of Birth: 08/28/1965

## 2018-01-30 NOTE — Therapy (Signed)
[place] MAIN [facility] SERVICES [address] [place], [place], [address] Phone: [phone]   Fax:  [phone]  Speech Language Pathology Treatment  Patient Details  Name: Valerie Bell MRN: [id number] Date of Birth: September 10, 1965 Referring Provider: Marshell Levan A    Encounter Date: 01/29/2018  End of Session - 01/30/18 1153    Visit Number  2    Number of Visits  4    Date for SLP Re-Evaluation  02/23/18    SLP Start Time  1430    SLP Stop Time   1530    SLP Time Calculation (min)  60 min    Activity Tolerance  Patient tolerated treatment well       Past Medical History:  Diagnosis Date  . Anxiety   . Collagen vascular disease ([place])   . Hypertension     Past Surgical History:  Procedure Laterality Date  . ABDOMINAL HYSTERECTOMY    . BREAST BIOPSY Right 17+ years ago   negative Core Bx    There were no vitals filed for this visit.  Subjective Assessment - 01/30/18 1152    Subjective  Patient states willingness to use external devices            ADULT SLP TREATMENT - 01/30/18 0001      General Information   Behavior/Cognition  Alert;Cooperative;Pleasant mood    HPI   Patient is a 52 year old woman with history of lupus, admitted to hospital on 12/25/2017 with diagnosis of bilateral lower extremity weakness, spinal cord dysfunction and paraplegia incomplete.  Multifocal findings on MRI Brain 12/25/2017: MRI brain showed multiple foci with restricted diffusion, primarily cortically-based, suggestive of embolic process. These may represent independent process from the spinal lesion.  Hospitalization at [facility] from 12/25/2017 and discharged 01/17/2018 after inpatient rehab stay.   She presents with lower extremity bilateral muscle weakness, RUE muscle weakness, lack of coordination, decreased memory, decreased cognition with problem solving abilities, decreased ability to perform basic self-care and IADL tasks.  Patient received  speech therapy services while at [facility] for cognitive deficits.         Treatment Provided   Treatment provided  Cognitive-Linquistic      Pain Assessment   Pain Assessment  No/denies pain      Cognitive-Linquistic Treatment   Treatment focused on  Cognition;Patient/family/caregiver education    Skilled Treatment  PATIENT EDUCATION:  Reviewed results of testing.  Patient given monthly/weekly planner.  Reviewed use of planner with worksheet practice.  Patient given loose leaf notebook with dividers.  Initial sections include notes/therapy/memory.  Patient given multiple handouts regarding memory and strategies to cope with memory impairment.  Patient given handouts regarding notebooks as memory/organization tool.  MEMORY: Patient required max cues (guided association strategy) to recall 5 people and associated object.      Assessment / Recommendations / Plan   Plan  Continue with current plan of care      Progression Toward Goals   Progression toward goals  Progressing toward goals       SLP Education - 01/30/18 1152    Education Details  external memory/organization tools    Person(s) Educated  Patient    Methods  Explanation    Comprehension  Verbalized understanding         SLP Long Term Goals - 01/24/18 1022      SLP LONG TERM GOAL #1   Title  Patient will demonstrate functional cognitive-communication skills for independent completion of personal  responsibilities and leisure activities.    Baseline  Patient is not independent    Time  4    Period  Weeks    Status  New    Target Date  02/23/18      SLP LONG TERM GOAL #2   Title  Patient will demonstrate functional use of external memory/organization aids.    Baseline  Patient does not have functional cognitive compensatory strategies.    Time  4    Period  Weeks    Status  New    Target Date  02/23/18      SLP LONG TERM GOAL #3   Title  Patient will participate in developing functional external memory/organization  aids.    Baseline  Patient does not have functional cognitive compensatory strategies.    Time  4    Period  Weeks    Status  New    Target Date  02/23/18       Plan - 01/30/18 1154    Clinical Impression Statement  Patient states willingness to use external memory/organizational tools.  Patient demonstrates functional language and cognitive skills to benefit from such strategies.  The patient is exhibiting word finding deficits, will collect word finding worksheets for her to use at home.    Speech Therapy Frequency  1x /week    Duration  4 weeks    Treatment/Interventions  Compensatory techniques;Cognitive reorganization;Patient/family education;SLP instruction and feedback    Potential to Achieve Goals  Good    Potential Considerations  Ability to learn/carryover information;Pain level;Family/community support;Co-morbidities;Previous level of function;Cooperation/participation level;Severity of impairments;Medical prognosis    SLP Home Exercise Plan  provided    Consulted and Agree with Plan of Care  Patient       Patient will benefit from skilled therapeutic intervention in order to improve the following deficits and impairments:   Cognitive communication deficit    Problem List Patient Active Problem List   Diagnosis Date Noted  . Chest pain 03/30/2017   Leroy Sea, MS/CCC- SLP  [name] 01/30/2018, 11:55 AM  [place] MAIN [facility] SERVICES [address] [place], [place], [address] Phone: [phone]   Fax:  [phone]   Name: Valerie Bell MRN: [id number] Date of Birth: 11/17/1965

## 2018-02-01 ENCOUNTER — Ambulatory Visit: Payer: Medicaid Other

## 2018-02-01 DIAGNOSIS — M6281 Muscle weakness (generalized): Secondary | ICD-10-CM

## 2018-02-01 DIAGNOSIS — R262 Difficulty in walking, not elsewhere classified: Secondary | ICD-10-CM

## 2018-02-01 DIAGNOSIS — R278 Other lack of coordination: Secondary | ICD-10-CM

## 2018-02-01 NOTE — Therapy (Signed)
[place] [facility] SERVICES [address] [place], [place], [address] Phone: [phone]   Fax:  [phone]  Physical Therapy Treatment  Patient Details  Name: Valerie Bell MRN: [id number] Date of Birth: 19-Apr-1966 Referring Provider: Marshell Levan A    Encounter Date: 02/01/2018  PT End of Session - 02/01/18 1006    Visit Number  3    Date for PT Re-Evaluation  03/20/18    Authorization Type  medicaid 3/4 (do goals next session)     PT Start Time  1001    PT Stop Time  1045    PT Time Calculation (min)  44 min    Equipment Utilized During Treatment  Gait belt;Other (comment)   L long leg brace   Activity Tolerance  Patient tolerated treatment well    Behavior During Therapy  WFL for tasks assessed/performed       Past Medical History:  Diagnosis Date  . Anxiety   . Collagen vascular disease ([place])   . Hypertension     Past Surgical History:  Procedure Laterality Date  . ABDOMINAL HYSTERECTOMY    . BREAST BIOPSY Right 17+ years ago   negative Core Bx    There were no vitals filed for this visit.  Subjective Assessment - 02/01/18 1004    Subjective  Patient reports yesterday she wasn't feeling to good so she didn't do her new HEP. Patient reports feeling stiff today and numb in both legs. Reports taking her BP medication. No pain or falls since last session.    Pertinent History   Valerie Bell is a 52 y.o. female who presented to the emergency department complaining of numbness in the left rib area. .  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 She is using a rollator and was independent in ambulation without AD prior to hospitilization. She was living with her mom prior to hospitilization. 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     Patient Stated Goals  Patient wants to be able to walk longer distances and get her legs stronger.    Currently in Pain?  No/denies      NuStep L1 x 3 minutes for warm-up (unbilled);  Seated: TrA activation 10x 3 second holds Hip adduction YTB PT hold 10x each leg Seated hip abduction YTB 15x 3 second holds Seated: YTB hamstring curl 10x each leg PT holding other side YTB PF 15x each leg   Airex balance with toe taps to 4" step alternating Valerie x 10 each, faded UE support until pt is completing without UE support;  airex pad: horizontal head turns 60 seconds , vertical head turns 6o seconds, opp UE arm raises 10x each arm  Speed ladder in // bars SUE step into each square for longer step length and hip flexion.   practice locking and unlocking rollater for sit to stand.  Ambulate 60 ft to weigh self: 95lb rollater and CGA                       PT Education - 02/01/18 1005    Education Details  exercise form/technique, HEP     Person(s) Educated  Patient    Methods  Explanation;Demonstration;Verbal cues    Comprehension  Verbalized understanding;Returned demonstration  PT Short Term Goals - 01/23/18 1108      PT SHORT TERM GOAL #1   Title  Patient will be independent in home exercise program to improve strength/mobility for better functional independence with ADLs.    Time  4    Period  Weeks    Status  New    Target Date  02/20/18      PT SHORT TERM GOAL #2   Title   Patient (< 32 years old) will complete five times sit to stand test in < 10 seconds indicating an increased Valerie strength and improved balance.    Baseline  50.19 sec    Time  4    Period  Weeks    Status  New    Target Date  02/20/18        PT Long Term Goals - 01/23/18 1125      PT LONG TERM GOAL #1   Title  Patient will increase 10 meter walk test to >1.108m/s as to improve gait speed for better community ambulation and to reduce fall risk.    Baseline  . 49 m/sec    Time  8     Period  Weeks    Status  New    Target Date  03/20/18      PT LONG TERM GOAL #2   Title  Patient will reduce timed up and go to <11 seconds to reduce fall risk and demonstrate improved transfer/gait ability.    Baseline  34.20 sec    Time  8    Period  Weeks    Status  New    Target Date  03/20/18      PT LONG TERM GOAL #3   Title   Patient will be independent with ascend/descend 2 steps using single UE in step over step pattern without LOB.    Baseline  unable to ambulate steps without BUE support and railings    Time  8    Period  Weeks    Status  New    Target Date  03/20/18      PT LONG TERM GOAL #4   Title  Patient will increase lower extremity functional scale to >60/80 to demonstrate improved functional mobility and increased tolerance with ADLs    Baseline  51/80    Time  8    Period  Weeks    Status  New    Target Date  03/20/18            Plan - 02/01/18 1047    Clinical Impression Statement  Patient left Valerie fatigues quicker than right resulting in decreased weight shift affecting patients stability and COM. Patient educated on TrA activation to retain COM when unstable. Pt will benefit from PT services to address deficits in strength, balance, and mobility in order to return to full function at home.     Rehab Potential  Good    PT Frequency  2x / week    PT Duration  8 weeks    PT Treatment/Interventions  Aquatic Therapy;Balance training;Neuromuscular re-education;Therapeutic activities;Functional mobility training;Stair training;Therapeutic exercise;Patient/family education;Manual techniques;Gait training    PT Next Visit Plan  Strengthening and gait training    Consulted and Agree with Plan of Care  Patient       Patient will benefit from skilled therapeutic intervention in order to improve the following deficits and impairments:  Abnormal gait, Decreased balance, Decreased endurance, Decreased mobility, Difficulty walking, Decreased activity tolerance,  Decreased coordination, Decreased  safety awareness, Decreased strength  Visit Diagnosis: Muscle weakness (generalized)  Difficulty in walking, not elsewhere classified  Other lack of coordination     Problem List Patient Active Problem List   Diagnosis Date Noted  . Chest pain 03/30/2017  Janna Arch, PT, DPT   02/01/2018, 10:47 AM  [place] MAIN [facility] SERVICES [address] [place], [place], [address] Phone: [phone]   Fax:  [phone]  Name: BRYNA RAZAVI MRN: [id number] Date of Birth: 01/03/1966

## 2018-02-04 ENCOUNTER — Encounter

## 2018-02-04 ENCOUNTER — Ambulatory Visit: Payer: Medicaid Other | Admitting: Speech Pathology

## 2018-02-04 DIAGNOSIS — R41841 Cognitive communication deficit: Secondary | ICD-10-CM

## 2018-02-04 DIAGNOSIS — M6281 Muscle weakness (generalized): Secondary | ICD-10-CM | POA: Diagnosis not present

## 2018-02-05 ENCOUNTER — Encounter: Payer: Self-pay | Admitting: Speech Pathology

## 2018-02-05 NOTE — Therapy (Signed)
[place] MAIN [facility] SERVICES [address] [place], [place], [address] Phone: [phone]   Fax:  [phone]  Speech Language Pathology Treatment  Patient Details  Name: Valerie Bell MRN: [id number] Date of Birth: December 11, 1965 Referring Provider: Marshell Levan A    Encounter Date: 02/04/2018  End of Session - 02/05/18 0913    Visit Number  3    Number of Visits  4    Date for SLP Re-Evaluation  02/23/18    SLP Start Time  1600    SLP Stop Time   8546    SLP Time Calculation (min)  53 min    Activity Tolerance  Patient tolerated treatment well       Past Medical History:  Diagnosis Date  . Anxiety   . Collagen vascular disease ([place])   . Hypertension     Past Surgical History:  Procedure Laterality Date  . ABDOMINAL HYSTERECTOMY    . BREAST BIOPSY Right 17+ years ago   negative Core Bx    There were no vitals filed for this visit.  Subjective Assessment - 02/05/18 0912    Subjective  Patient states willingness to use external devices            ADULT SLP TREATMENT - 02/05/18 0001      General Information   Behavior/Cognition  Alert;Cooperative;Pleasant mood    HPI   Patient is a 52 year old woman with history of lupus, admitted to hospital on 12/25/2017 with diagnosis of bilateral lower extremity weakness, spinal cord dysfunction and paraplegia incomplete.  Multifocal findings on MRI Brain 12/25/2017: MRI brain showed multiple foci with restricted diffusion, primarily cortically-based, suggestive of embolic process. These may represent independent process from the spinal lesion.  Hospitalization at [facility] from 12/25/2017 and discharged 01/17/2018 after inpatient rehab stay.   She presents with lower extremity bilateral muscle weakness, RUE muscle weakness, lack of coordination, decreased memory, decreased cognition with problem solving abilities, decreased ability to perform basic self-care and IADL tasks.  Patient received  speech therapy services while at [facility] for cognitive deficits.         Treatment Provided   Treatment provided  Cognitive-Linquistic      Pain Assessment   Pain Assessment  No/denies pain      Cognitive-Linquistic Treatment   Treatment focused on  Cognition;Patient/family/caregiver education    Skilled Treatment  PATIENT EDUCATION:  Reviewed Airline pilot.   Patient given loose leaf notebook with dividers.  Initial sections include notes/therapy/memory.  Patient given multiple handouts regarding memory and strategies to cope with memory impairment.  Patient given handouts regarding notebooks as memory/organization tool.  The patient has not implemented use of the planner or review of material given.  SLP counseled regarding using the planner and notebook as tools to aid her memory.  LANGUAGE: Patient was given a variety of language exercises for home practice.  The patient was able to complete simple verbal analogies, given 3 possible responses, with 70% accuracy.      Assessment / Recommendations / Plan   Plan  Continue with current plan of care      Progression Toward Goals   Progression toward goals  Progressing toward goals       SLP Education - 02/05/18 0912    Education Details  external memory/organization tools, word finding strategies    Person(s) Educated  Patient    Methods  Explanation    Comprehension  Verbalized understanding  SLP Long Term Goals - 01/24/18 1022      SLP LONG TERM GOAL #1   Title  Patient will demonstrate functional cognitive-communication skills for independent completion of personal responsibilities and leisure activities.    Baseline  Patient is not independent    Time  4    Period  Weeks    Status  New    Target Date  02/23/18      SLP LONG TERM GOAL #2   Title  Patient will demonstrate functional use of external memory/organization aids.    Baseline  Patient does not have functional cognitive compensatory strategies.    Time   4    Period  Weeks    Status  New    Target Date  02/23/18      SLP LONG TERM GOAL #3   Title  Patient will participate in developing functional external memory/organization aids.    Baseline  Patient does not have functional cognitive compensatory strategies.    Time  4    Period  Weeks    Status  New    Target Date  02/23/18       Plan - 02/05/18 0913    Clinical Impression Statement  Patient states willingness to use external memory/organizational tools, but has not implemented use independently.  Patient demonstrates functional language and cognitive skills to benefit from such strategies.  The patient is exhibiting word finding deficits, will continue to collect word finding worksheets for her to use at home.  Will review use of notebooks next session.    Speech Therapy Frequency  1x /week    Duration  4 weeks    Treatment/Interventions  Compensatory techniques;Cognitive reorganization;Patient/family education;SLP instruction and feedback    Potential to Achieve Goals  Good    Potential Considerations  Ability to learn/carryover information;Pain level;Family/community support;Co-morbidities;Previous level of function;Cooperation/participation level;Severity of impairments;Medical prognosis    SLP Home Exercise Plan  provided    Consulted and Agree with Plan of Care  Patient       Patient will benefit from skilled therapeutic intervention in order to improve the following deficits and impairments:   Cognitive communication deficit    Problem List Patient Active Problem List   Diagnosis Date Noted  . Chest pain 03/30/2017   Leroy Sea, MS/CCC- SLP  [name] 02/05/2018, 9:14 AM  [place] MAIN [facility] SERVICES [address] [place], [place], [address] Phone: [phone]   Fax:  [phone]   Name: Valerie Bell MRN: [id number] Date of Birth: 12/14/1965

## 2018-02-06 ENCOUNTER — Ambulatory Visit: Payer: Medicaid Other | Admitting: Physical Therapy

## 2018-02-06 ENCOUNTER — Encounter: Payer: Self-pay | Admitting: Physical Therapy

## 2018-02-06 ENCOUNTER — Ambulatory Visit: Payer: Medicaid Other | Admitting: Occupational Therapy

## 2018-02-06 DIAGNOSIS — M6281 Muscle weakness (generalized): Secondary | ICD-10-CM | POA: Diagnosis not present

## 2018-02-06 DIAGNOSIS — R278 Other lack of coordination: Secondary | ICD-10-CM

## 2018-02-06 DIAGNOSIS — R262 Difficulty in walking, not elsewhere classified: Secondary | ICD-10-CM

## 2018-02-06 DIAGNOSIS — R41841 Cognitive communication deficit: Secondary | ICD-10-CM

## 2018-02-06 NOTE — Therapy (Signed)
[place] MAIN [facility] SERVICES [address] [place], [place], [address] Phone: [phone]   Fax:  [phone]  Physical Therapy Treatment / progress Note  Patient Details  Name: Valerie Bell MRN: [id number] Date of Birth: 08-24-1965 Referring Provider: Marshell Levan A    Encounter Date: 02/06/2018  PT End of Session - 02/06/18 1113    Visit Number  4    Date for PT Re-Evaluation  03/20/18    Authorization Type  medicaid 4/4 (do goals next session)     PT Start Time  1100    PT Stop Time  1145    PT Time Calculation (min)  45 min    Equipment Utilized During Treatment  Gait belt;Other (comment)   L long leg brace   Activity Tolerance  Patient tolerated treatment well    Behavior During Therapy  WFL for tasks assessed/performed       Past Medical History:  Diagnosis Date  . Anxiety   . Collagen vascular disease ([place])   . Hypertension     Past Surgical History:  Procedure Laterality Date  . ABDOMINAL HYSTERECTOMY    . BREAST BIOPSY Right 17+ years ago   negative Core Bx    There were no vitals filed for this visit.  Subjective Assessment - 02/06/18 1110    Subjective  Patient reports yesterday she wasn't feeling to good so she didn't do her new HEP, becauses she had a fall .Patient reports feeling stiff today and numb in both legs. Reports taking her BP medication. No pain or falls since last session.    Pertinent History   Valerie Bell is a 52 y.o. female who presented to the emergency department complaining of numbness in the left rib area. .  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 She is using a rollator and was independent in ambulation without AD prior to hospitilization. She was living with her mom prior to hospitilization. 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     Patient Stated Goals  Patient wants to be able to walk longer distances and get her legs stronger.    Currently in Pain?  No/denies    Multiple Pain Sites  No       Treatment:  NuStep L1 x 3 minutes for warm-up (unbilled); Supine with TA activation : SLR x 10 x 2  Hip abd/add x 10 x 2  Heel slides x 10 BLE   Patient progressed in goals STG 1, LTG 2 and 4 and STG 2, LTG 1,and 3  are ongoing.                       PT Education - 02/06/18 1113    Education Details  HEP    Person(s) Educated  Patient    Methods  Explanation;Demonstration;Tactile cues    Comprehension  Verbalized understanding;Returned demonstration       PT Short Term Goals - 02/06/18 1114      PT SHORT TERM GOAL #1   Title  Patient will be independent in home exercise program to improve strength/mobility for better functional independence with ADLs.    Baseline  Patient has not been doing her HEP after she had a fall.    Time  4  Period  Weeks    Status  Partially Met    Target Date  03/06/18      PT SHORT TERM GOAL #2   Title   Patient (< 35 years old) will complete five times sit to stand test in < 10 seconds indicating an increased LE strength and improved balance.    Baseline  50.19 sec,, 46.19 sec 02/06/18    Time  4    Period  Weeks    Status  Partially Met    Target Date  03/06/18        PT Long Term Goals - 02/06/18 1121      PT LONG TERM GOAL #1   Title  Patient will increase 10 meter walk test to >1.50ms as to improve gait speed for better community ambulation and to reduce fall risk.    Baseline  . 49 m/sec, 02/06/18=, 39 sec     Time  8    Period  Weeks    Status  On-going    Target Date  04/03/18      PT LONG TERM GOAL #2   Title  Patient will reduce timed up and go to <11 seconds to reduce fall risk and demonstrate improved transfer/gait ability.    Baseline  34.20 sec, 02/06/18 =33.80 sec    Time  8    Period  Weeks     Status  Partially Met    Target Date  04/03/18      PT LONG TERM GOAL #3   Title   Patient will be independent with ascend/descend 2 steps using single UE in step over step pattern without LOB.    Baseline  unable to ambulate steps without BUE support and railings    Time  8    Period  Weeks    Status  On-going    Target Date  04/03/18      PT LONG TERM GOAL #4   Title  Patient will increase lower extremity functional scale to >60/80 to demonstrate improved functional mobility and increased tolerance with ADLs    Baseline  51/80, 57/80 02/06/18    Time  8    Period  Weeks    Status  Partially Met    Target Date  04/03/18            Plan - 02/06/18 1134    Clinical Impression Statement Patient's condition has the potential to improve in response to therapy. Maximum improvement is yet to be obtained. The anticipated improvement is attainable and reasonable in a generally predictable time.. Patient reports that she fell down 2 days ago. She progressed in her goals and will benefit from continued skilled PT to progress strengthening and mobility.Patient left LE fatigues quicker than right resulting in decreased weight shift affecting patients stability and COM. Patient educated on TrA activation to retain COM when unstable. Pt will benefit from PT services to address deficits in strength, balance, and mobility in order to return to full function at home.    Rehab Potential  Good    PT Frequency  2x / week    PT Duration  8 weeks    PT Treatment/Interventions  Aquatic Therapy;Balance training;Neuromuscular re-education;Therapeutic activities;Functional mobility training;Stair training;Therapeutic exercise;Patient/family education;Manual techniques;Gait training    PT Next Visit Plan  Strengthening and gait training    Consulted and Agree with Plan of Care  Patient       Patient will benefit from skilled therapeutic intervention in order to improve the  following deficits and  impairments:  Abnormal gait, Decreased balance, Decreased endurance, Decreased mobility, Difficulty walking, Decreased activity tolerance, Decreased coordination, Decreased safety awareness, Decreased strength  Visit Diagnosis: Cognitive communication deficit  Muscle weakness (generalized)  Difficulty in walking, not elsewhere classified  Other lack of coordination     Problem List Patient Active Problem List   Diagnosis Date Noted  . Chest pain 03/30/2017    Alanson Puls , [place] DPT 02/06/2018, 11:41 AM  [place] MAIN [facility] SERVICES [address] [place], [place], [address] Phone: [phone]   Fax:  [phone]  Name: Valerie Bell MRN: [id number] Date of Birth: 1966/02/19

## 2018-02-07 ENCOUNTER — Ambulatory Visit: Payer: Medicaid Other | Admitting: Speech Pathology

## 2018-02-08 ENCOUNTER — Ambulatory Visit: Payer: Medicaid Other

## 2018-02-11 ENCOUNTER — Encounter: Payer: Medicaid Other | Admitting: Speech Pathology

## 2018-02-12 ENCOUNTER — Ambulatory Visit: Payer: Medicaid Other | Admitting: Physical Therapy

## 2018-02-13 ENCOUNTER — Encounter: Payer: Medicaid Other | Admitting: Speech Pathology

## 2018-02-14 ENCOUNTER — Encounter: Payer: Medicaid Other | Admitting: Occupational Therapy

## 2018-02-14 ENCOUNTER — Ambulatory Visit: Payer: Medicaid Other | Admitting: Physical Therapy

## 2018-02-15 NOTE — Therapy (Signed)
[place] MAIN [facility] SERVICES [address] [place], [place], [address] Phone: [phone]   Fax:  [phone]  Patient Details  Name: PERCILLA TWETEN MRN: [id number] Date of Birth: 1965/10/03 Referring Provider:  Sherlyn Lees,*  Encounter Date: 02/06/2018   Harrel Carina, MS, OTR/L 02/15/2018, 12:41 PM  [place] [address] [place], [place], [address] Phone: [phone]   Fax:  [phone]

## 2018-02-19 ENCOUNTER — Ambulatory Visit: Payer: Medicaid Other | Admitting: Occupational Therapy

## 2018-02-19 ENCOUNTER — Ambulatory Visit: Payer: Medicaid Other | Attending: Physical Medicine and Rehabilitation | Admitting: Physical Therapy

## 2018-02-21 ENCOUNTER — Ambulatory Visit: Payer: Medicaid Other | Admitting: Occupational Therapy

## 2018-02-21 ENCOUNTER — Ambulatory Visit: Payer: Medicaid Other | Admitting: Physical Therapy

## 2018-02-26 ENCOUNTER — Encounter: Payer: Medicaid Other | Admitting: Occupational Therapy

## 2018-02-28 ENCOUNTER — Ambulatory Visit: Payer: Medicaid Other

## 2018-02-28 ENCOUNTER — Encounter: Payer: Medicaid Other | Admitting: Occupational Therapy

## 2018-03-05 ENCOUNTER — Ambulatory Visit: Payer: Medicaid Other

## 2018-03-05 ENCOUNTER — Encounter: Payer: Medicaid Other | Admitting: Occupational Therapy

## 2018-03-07 ENCOUNTER — Encounter: Payer: Medicaid Other | Admitting: Occupational Therapy

## 2018-03-12 ENCOUNTER — Ambulatory Visit: Payer: Medicaid Other

## 2018-03-12 ENCOUNTER — Encounter: Payer: Medicaid Other | Admitting: Occupational Therapy

## 2018-03-15 ENCOUNTER — Ambulatory Visit: Payer: Medicaid Other

## 2018-03-15 ENCOUNTER — Encounter: Payer: Medicaid Other | Admitting: Occupational Therapy

## 2018-03-18 ENCOUNTER — Ambulatory Visit: Payer: Medicaid Other

## 2018-03-18 ENCOUNTER — Encounter: Payer: Medicaid Other | Admitting: Occupational Therapy

## 2018-03-20 ENCOUNTER — Encounter: Payer: Medicaid Other | Admitting: Occupational Therapy

## 2018-03-20 ENCOUNTER — Ambulatory Visit: Payer: Medicaid Other

## 2018-04-11 ENCOUNTER — Encounter: Payer: Self-pay | Admitting: Occupational Therapy

## 2018-04-11 DIAGNOSIS — R278 Other lack of coordination: Secondary | ICD-10-CM

## 2018-04-11 DIAGNOSIS — M6281 Muscle weakness (generalized): Secondary | ICD-10-CM

## 2018-04-11 NOTE — Therapy (Signed)
[place] MAIN [facility] SERVICES [address] [place], [place], [address] Phone: [phone]   Fax:  [phone]  April 11, 2018    @CCLISTADDRESS @  Occupational Therapy Discharge Summary   Patient: Valerie Bell MRN: [id number] Date of Birth: Jul 29, 1965  Diagnosis: Muscle weakness (generalized)  Other lack of coordination Ed.  No data recorded  The above patient had been seen in Occupational Therapy for 3 visits.   The treatment consisted of ADL training, A/E training, UE neuromuscular re education, and pt. education The patient is: unchanged  Subjective: Clinic attempts to contact pt. were unsuccessful, as telephone number was disconnected.   OT Long Term Goals -           OT LONG TERM GOAL #1   Title Patient will demonstrate shoe donning and doffing including tying with modified independence.     Baseline Discharge: Pt. Continued to be unable to tie shoes.   Time  6    Period  Weeks    Status  New    Target Date  03/05/18        OT LONG TERM GOAL #2   Title Patient to increase right grip by 5# to open jars and containers.     Baseline Discharge: Pt. Continued to have difficulty using her right hand. 40#    Time  6    Period  Weeks    Status  New    Target Date  03/05/18        OT LONG TERM GOAL #3   Title Patient will complete grooming at the sink in standing with applying makeup for up to 5 mins with supervision.      Baseline  Discharge: Continued to have difficulty   Time  6    Period  Weeks    Status  New    Target Date  03/05/18        OT LONG TERM GOAL #4   Title Patient will complete light meal prep with modified independence.     Baseline Discharge: Pt. Continued to have difficulty     Time  6    Period  Weeks    Status  New    Target Date  03/05/18        OT LONG TERM GOAL #5   Title Patient will be independent with home exercise program for RUE weakness and  coordination    Baseline Discharge: HEP for thereaputty   Time  4    Period  Weeks    Status  New    Target Date  02/19/18                    Harrel Carina, MS, OTR/L   [place] [address] [place], [place], [address] Phone: [phone]   Fax:  [phone]  Patient: Valerie Bell MRN: [id number] Date of Birth: Nov 25, 1965

## 2018-06-14 ENCOUNTER — Emergency Department: Payer: Medicare Other

## 2018-06-14 ENCOUNTER — Emergency Department
Admission: EM | Admit: 2018-06-14 | Discharge: 2018-06-14 | Disposition: A | Discharge status: Home / Self Care | Payer: Medicare Other | Attending: Emergency Medicine | Admitting: Emergency Medicine

## 2018-06-14 ENCOUNTER — Other Ambulatory Visit: Payer: Self-pay

## 2018-06-14 DIAGNOSIS — Z87891 Personal history of nicotine dependence: Secondary | ICD-10-CM | POA: Diagnosis not present

## 2018-06-14 DIAGNOSIS — R2241 Localized swelling, mass and lump, right lower limb: Secondary | ICD-10-CM | POA: Diagnosis present

## 2018-06-14 DIAGNOSIS — M3210 Systemic lupus erythematosus, organ or system involvement unspecified: Secondary | ICD-10-CM | POA: Insufficient documentation

## 2018-06-14 DIAGNOSIS — Z79899 Other long term (current) drug therapy: Secondary | ICD-10-CM | POA: Insufficient documentation

## 2018-06-14 DIAGNOSIS — I10 Essential (primary) hypertension: Secondary | ICD-10-CM | POA: Insufficient documentation

## 2018-06-14 DIAGNOSIS — I82491 Acute embolism and thrombosis of other specified deep vein of right lower extremity: Secondary | ICD-10-CM | POA: Diagnosis not present

## 2018-06-14 DIAGNOSIS — I824Y1 Acute embolism and thrombosis of unspecified deep veins of right proximal lower extremity: Secondary | ICD-10-CM

## 2018-06-14 HISTORY — DX: Systemic lupus erythematosus, unspecified: M32.9

## 2018-06-14 HISTORY — DX: Disease of spinal cord, unspecified: G95.9

## 2018-06-14 HISTORY — DX: Acute embolism and thrombosis of unspecified deep veins of unspecified lower extremity: I82.409

## 2018-06-14 LAB — COMPREHENSIVE METABOLIC PANEL
ALT: 13 U/L (ref 0–44)
AST: 18 U/L (ref 15–41)
Albumin: 4.1 g/dL (ref 3.5–5.0)
Alkaline Phosphatase: 55 U/L (ref 38–126)
Anion gap: 7 (ref 5–15)
BUN: 17 mg/dL (ref 6–20)
CO2: 28 mmol/L (ref 22–32)
Calcium: 9.2 mg/dL (ref 8.9–10.3)
Chloride: 104 mmol/L (ref 98–111)
Creatinine, Ser: 0.65 mg/dL (ref 0.44–1.00)
GFR calc Af Amer: >60 mL/min (ref >60)
GFR calc non Af Amer: >60 mL/min (ref >60)
Glucose, Bld: 82 mg/dL (ref 70–99)
Potassium: 3.7 mmol/L (ref 3.5–5.1)
Sodium: 139 mmol/L (ref 135–145)
Total Bilirubin: 0.5 mg/dL (ref 0.3–1.2)
Total Protein: 8 g/dL (ref 6.5–8.1)

## 2018-06-14 LAB — CBC
HCT: 37.9 % (ref 36.0–46.0)
Hemoglobin: 12.2 g/dL (ref 12.0–15.0)
MCH: 31.6 pg (ref 26.0–34.0)
MCHC: 32.2 g/dL (ref 30.0–36.0)
MCV: 98.2 fL (ref 80.0–100.0)
Platelets: 160 10*3/uL (ref 150–400)
RBC: 3.86 MIL/uL — ABNORMAL LOW (ref 3.87–5.11)
RDW: 12.5 % (ref 11.5–15.5)
WBC: 5.7 10*3/uL (ref 4.0–10.5)
nRBC: 0 % (ref 0.0–0.2)

## 2018-06-14 MED ORDER — HYDRALAZINE HCL 25 MG PO TABS: 25.0000 mg | ORAL_TABLET | Freq: Three times a day (TID) | ORAL | 1 refills | Status: DC

## 2018-06-14 MED ORDER — RIVAROXABAN (XARELTO) VTE STARTER PACK (15 & 20 MG): ORAL_TABLET | ORAL | 0 refills | Status: DC

## 2018-06-14 MED ORDER — ATORVASTATIN CALCIUM 80 MG PO TABS: 80.0000 mg | ORAL_TABLET | Freq: Every day | ORAL | 1 refills | Status: DC

## 2018-06-14 MED ORDER — SPIRONOLACTONE 25 MG PO TABS: 25.0000 mg | ORAL_TABLET | Freq: Every day | ORAL | 1 refills | Status: DC

## 2018-06-14 NOTE — ED Triage Notes (Addendum)
Pt c/o right LE swelling for the past 3 weeks. Denies any pain or s/sx of infection. Pt has a hx of DVT in the same extremity in July and has been out her xeralto for the past 2 months as well as all her other medications

## 2018-06-14 NOTE — Discharge Instructions (Addendum)
Please seek medical attention for any high fevers, chest pain, shortness of breath, change in behavior, persistent vomiting, bloody stool or any other new or concerning symptoms.  

## 2018-06-14 NOTE — ED Provider Notes (Signed)
[facility] Emergency Department Provider Note  ____________________________________________   I have reviewed the triage vital signs and the nursing notes.   HISTORY  Chief Complaint Leg Swelling   History limited by: Not Limited   HPI Valerie Bell is a 52 y.o. female who presents to the emergency department today because of concern for right leg swelling. She states that the swelling started a couple of weeks ago.  It has gotten slightly worse.  The patient denies any pain in that leg.  She denies any chest pain or shortness of breath.  No fevers or skin changes.  The patient does have a history of blood clots.  She states she has not been taking any of her medications for the past 2 months after running out of them.  She denies any trauma to her leg.   Per medical record review patient has a history of DVT, Lupus.  Past Medical History:  Diagnosis Date  . Anxiety   . Collagen vascular disease ([place])   . DVT (deep venous thrombosis) ([place])   . Hypertension   . Lupus (systemic lupus erythematosus) ([place])   . Spinal cord lesion [facility])     Patient Active Problem List   Diagnosis Date Noted  . Chest pain 03/30/2017    Past Surgical History:  Procedure Laterality Date  . ABDOMINAL HYSTERECTOMY    . BREAST BIOPSY Right 17+ years ago   negative Core Bx    Prior to Admission medications   Medication Sig Start Date End Date Taking? Authorizing Provider  feeding supplement, ENSURE ENLIVE, (ENSURE ENLIVE) LIQD Take 237 mLs by mouth 3 (three) times daily between meals. 03/31/17   Loletha Grayer, MD  hydrALAZINE (APRESOLINE) 25 MG tablet Take 1 tablet (25 mg total) by mouth 2 (two) times daily with a meal. 12/12/16 12/12/17  Sable Feil, PA-C  hydroxychloroquine (PLAQUENIL) 200 MG tablet Take 100-200 mg by mouth 2 (two) times daily.    [provider]  lisinopril (PRINIVIL,ZESTRIL) 40 MG tablet Take 1 tablet (40 mg total) by mouth daily. 12/12/16    Sable Feil, PA-C  mometasone (ELOCON) 0.1 % cream Apply 1 application topically daily.    [provider]  pantoprazole (PROTONIX) 40 MG tablet Take 1 tablet (40 mg total) by mouth daily. 03/31/17   Loletha Grayer, MD  spironolactone (ALDACTONE) 25 MG tablet Take 1 tablet (25 mg total) by mouth 2 (two) times daily. 12/12/16 12/12/17  Sable Feil, PA-C  triamcinolone ointment (KENALOG) 0.1 % Apply 1 application topically 2 (two) times daily.    [provider]    Allergies Patient has no known allergies.  Family History  Problem Relation Age of Onset  . Arthritis Mother   . Hypertension Mother   . Breast cancer Neg Hx     Social History Social History   Tobacco Use  . Smoking status: Former Smoker    Packs/day: 0.30    Years: 4.00    Pack years: 1.20    Types: Cigarettes    Last attempt to quit: 12/30/2003    Years since quitting: 14.4  . Smokeless tobacco: Never Used  Substance Use Topics  . Alcohol use: No    Alcohol/week: 0.0 standard drinks  . Drug use: Yes    Types: Marijuana    Comment: 2 days ago.     Review of Systems Constitutional: No fever/chills Eyes: No visual changes. ENT: No sore throat. Cardiovascular: Denies chest pain. Respiratory: Denies shortness of  breath. Gastrointestinal: No abdominal pain.  No nausea, no vomiting.  No diarrhea.   Genitourinary: Negative for dysuria. Musculoskeletal: Positive for right leg swelling Skin: Negative for rash. Neurological: Negative for headaches, focal weakness or numbness.  ____________________________________________   PHYSICAL EXAM:  VITAL SIGNS: ED Triage Vitals  Enc Vitals Group     BP 06/14/18 1154 (!) 167/108     Pulse Rate 06/14/18 1154 80     Resp 06/14/18 1154 18     Temp 06/14/18 1154 98 F (36.7 C)     Temp Source 06/14/18 1154 Oral     SpO2 06/14/18 1154 100 %     Weight 06/14/18 1155 91 lb (41.3 kg)     Height 06/14/18 1155 5\' 3"  (1.6 m)     Head  Circumference --      Peak Flow --      Pain Score 06/14/18 1155 0   Constitutional: Alert and oriented.  Eyes: Conjunctivae are normal.  ENT      Head: Normocephalic and atraumatic.      Nose: No congestion/rhinnorhea.      Mouth/Throat: Mucous membranes are moist.      Neck: No stridor. Hematological/Lymphatic/Immunilogical: No cervical lymphadenopathy. Cardiovascular: Normal rate, regular rhythm.  No murmurs, rubs, or gallops. Respiratory: Normal respiratory effort without tachypnea nor retractions. Breath sounds are clear and equal bilaterally. No wheezes/rales/rhonchi. Gastrointestinal: Soft and non tender. No rebound. No guarding.  Genitourinary: Deferred Musculoskeletal: Normal range of motion in all extremities. No lower extremity edema. DP 2+ in right lower leg. Non tender palpation of the calf, compartments soft.  Neurologic:  Normal speech and language. No gross focal neurologic deficits are appreciated.  Skin:  Skin is warm, dry and intact. No rash noted. Psychiatric: Mood and affect are normal. Speech and behavior are normal. Patient exhibits appropriate insight and judgment.  ____________________________________________    LABS (pertinent positives/negatives)  CBC wbc 5.7, hgb 12.2, plt 160 CMP wnl  ____________________________________________   EKG  None  ____________________________________________    RADIOLOGY  US venous RLE Positive acute DVT  ____________________________________________   PROCEDURES  Procedures  ____________________________________________   INITIAL IMPRESSION / ASSESSMENT AND PLAN / ED COURSE  Pertinent labs & imaging results that were available during my care of the patient were reviewed by me and considered in my medical decision making (see chart for details).   Patient presented with right lower extremity swelling. [place] is positive for DVT. Patient has history of DVT and has been off of her medication for 2 months. Will  restart xeralto. Discussed risks of blood thinning medication. Will also give patient prescription for other home medications.   ____________________________________________   FINAL CLINICAL IMPRESSION(S) / ED DIAGNOSES  Final diagnoses:  Acute deep vein thrombosis (DVT) of proximal vein of right lower extremity ([place])     Note: This dictation was prepared with Dragon dictation. Any transcriptional errors that result from this process are unintentional     Nance Pear, MD 06/14/18 1531

## 2018-08-19 ENCOUNTER — Encounter: Payer: Self-pay | Admitting: Emergency Medicine

## 2018-08-19 ENCOUNTER — Emergency Department
Admission: EM | Admit: 2018-08-19 | Discharge: 2018-08-19 | Disposition: A | Discharge status: Home / Self Care | Payer: Medicare Other | Attending: Emergency Medicine | Admitting: Emergency Medicine

## 2018-08-19 ENCOUNTER — Emergency Department: Payer: Medicare Other

## 2018-08-19 DIAGNOSIS — Z79899 Other long term (current) drug therapy: Secondary | ICD-10-CM | POA: Insufficient documentation

## 2018-08-19 DIAGNOSIS — Z87891 Personal history of nicotine dependence: Secondary | ICD-10-CM | POA: Insufficient documentation

## 2018-08-19 DIAGNOSIS — I82591 Chronic embolism and thrombosis of other specified deep vein of right lower extremity: Secondary | ICD-10-CM | POA: Diagnosis not present

## 2018-08-19 DIAGNOSIS — M79604 Pain in right leg: Secondary | ICD-10-CM | POA: Diagnosis present

## 2018-08-19 DIAGNOSIS — M3210 Systemic lupus erythematosus, organ or system involvement unspecified: Secondary | ICD-10-CM | POA: Diagnosis not present

## 2018-08-19 DIAGNOSIS — I10 Essential (primary) hypertension: Secondary | ICD-10-CM | POA: Diagnosis not present

## 2018-08-19 DIAGNOSIS — I825Y1 Chronic embolism and thrombosis of unspecified deep veins of right proximal lower extremity: Secondary | ICD-10-CM

## 2018-08-19 MED ORDER — HYDROCORTISONE 1 % EX OINT: 1.0000 "application " | TOPICAL_OINTMENT | Freq: Two times a day (BID) | CUTANEOUS | 0 refills | Status: DC

## 2018-08-19 NOTE — ED Triage Notes (Signed)
Right leg war from knee down, slight swelling noted.

## 2018-08-19 NOTE — ED Provider Notes (Signed)
[facility] Emergency Department Provider Note ____________________________________________   First MD Initiated Contact with Patient 08/19/18 1349     (approximate)  I have reviewed the triage vital signs and the nursing notes.   HISTORY  Chief Complaint Leg Pain    HPI Valerie Bell is a 53 y.o. female with PMH as noted below including lupus and prior history of DVT who presents with right lower leg swelling, gradual onset over the last week, associated with some pain especially when bearing weight.  She denies any trauma or injury.  She states that she has had a DVT there in the past.  She denies any associated chest pain, shortness of breath, fever chills, or rash to the leg.  She does have few areas of an itchy rash to her upper back and right shoulder which have also been occurring over the last several days.  The patient states she is on Xarelto.  Past Medical History:  Diagnosis Date  . Anxiety   . Collagen vascular disease ([place])   . DVT (deep venous thrombosis) ([place])   . Hypertension   . Lupus (systemic lupus erythematosus) ([place])   . Spinal cord lesion [facility])     Patient Active Problem List   Diagnosis Date Noted  . Chest pain 03/30/2017    Past Surgical History:  Procedure Laterality Date  . ABDOMINAL HYSTERECTOMY    . BREAST BIOPSY Right 17+ years ago   negative Core Bx    Prior to Admission medications   Medication Sig Start Date End Date Taking? Authorizing Provider  atorvastatin (LIPITOR) 80 MG tablet Take 1 tablet (80 mg total) by mouth daily at 6 PM. 06/14/18 06/14/19  Nance Pear, MD  feeding supplement, ENSURE ENLIVE, (ENSURE ENLIVE) LIQD Take 237 mLs by mouth 3 (three) times daily between meals. 03/31/17   Loletha Grayer, MD  hydrALAZINE (APRESOLINE) 25 MG tablet Take 1 tablet (25 mg total) by mouth 2 (two) times daily with a meal. 12/12/16 12/12/17  Sable Feil, PA-C  hydrALAZINE (APRESOLINE) 25 MG tablet Take 1  tablet (25 mg total) by mouth 3 (three) times daily. 06/14/18 06/14/19  Nance Pear, MD  hydroxychloroquine (PLAQUENIL) 200 MG tablet Take 100-200 mg by mouth 2 (two) times daily.    [provider]  lisinopril (PRINIVIL,ZESTRIL) 40 MG tablet Take 1 tablet (40 mg total) by mouth daily. 12/12/16   Sable Feil, PA-C  mometasone (ELOCON) 0.1 % cream Apply 1 application topically daily.    [provider]  pantoprazole (PROTONIX) 40 MG tablet Take 1 tablet (40 mg total) by mouth daily. 03/31/17   Loletha Grayer, MD  Rivaroxaban 15 & 20 MG TBPK Take as directed on package: Start with one '15mg'$  tablet by mouth twice a day with food. On Day 22, switch to one '20mg'$  tablet once a day with food. 06/14/18   Nance Pear, MD  spironolactone (ALDACTONE) 25 MG tablet Take 1 tablet (25 mg total) by mouth 2 (two) times daily. 12/12/16 12/12/17  Sable Feil, PA-C  spironolactone (ALDACTONE) 25 MG tablet Take 1 tablet (25 mg total) by mouth daily. 06/14/18 06/14/19  Nance Pear, MD  triamcinolone ointment (KENALOG) 0.1 % Apply 1 application topically 2 (two) times daily.    [provider]    Allergies Patient has no known allergies.  Family History  Problem Relation Age of Onset  . Arthritis Mother   . Hypertension Mother   . Breast cancer Neg Hx  Social History Social History   Tobacco Use  . Smoking status: Former Smoker    Packs/day: 0.30    Years: 4.00    Pack years: 1.20    Types: Cigarettes    Last attempt to quit: 12/30/2003    Years since quitting: 14.6  . Smokeless tobacco: Never Used  Substance Use Topics  . Alcohol use: No    Alcohol/week: 0.0 standard drinks  . Drug use: Yes    Types: Marijuana    Comment: 2 days ago.     Review of Systems  Constitutional: No fever. Eyes: No redness. ENT: No sore throat. Cardiovascular: Denies chest pain. Respiratory: Denies shortness of breath. Gastrointestinal: No vomiting or diarrhea.    Genitourinary: Negative for flank pain.  Musculoskeletal: Negative for back pain.  Positive for right leg pain. Skin: Positive for rash. Neurological: Negative for focal weakness or numbness.   ____________________________________________   PHYSICAL EXAM:  VITAL SIGNS: ED Triage Vitals [08/19/18 1101]  Enc Vitals Group     BP (!) 116/102     Pulse Rate 86     Resp 20     Temp 98.7 F (37.1 C)     Temp Source Oral     SpO2 99 %     Weight 100 lb (45.4 kg)     Height '5\' 3"'$  (1.6 m)     Head Circumference      Peak Flow      Pain Score 2     Pain Loc      Pain Edu?      Excl. in [place]?     Constitutional: Alert and oriented.  Relatively well appearing and in no acute distress. Eyes: Conjunctivae are normal.  Head: Atraumatic. Nose: No congestion/rhinnorhea. Mouth/Throat: Mucous membranes are moist.   Neck: Normal range of motion.  Cardiovascular: Good peripheral circulation. Respiratory: Normal respiratory effort.   Gastrointestinal: No distention.  Musculoskeletal: Extremities warm and well perfused.  Very mild right lower extremity swelling below the knee.  2+ DP pulse.  Cap refill <2 seconds.  Full range of motion at all joints.  No erythema or induration. Neurologic:  Normal speech and language. No gross focal neurologic deficits are appreciated.  5/5 motor strength bilateral lower extremities.  Normal sensation. Skin:  Skin is warm and dry.  A few faint, blanching 1 cm papules/urticaria to upper back and right shoulder. Psychiatric: Mood and affect are normal. Speech and behavior are normal.  ____________________________________________   LABS (all labs ordered are listed, but only abnormal results are displayed)  Labs Reviewed - No data to display ____________________________________________  EKG   ____________________________________________  RADIOLOGY  [place] RLE: DVT, unchanged from prior ultrasound in  05/2018  ____________________________________________   PROCEDURES  Procedure(s) performed: No  Procedures  Critical Care performed: No ____________________________________________   INITIAL IMPRESSION / ASSESSMENT AND PLAN / ED COURSE  Pertinent labs & imaging results that were available during my care of the patient were reviewed by me and considered in my medical decision making (see chart for details).  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  She also has a faint rash to her upper back over the last few days which is pruritic.  On exam the patient is overall well-appearing and her vital signs are normal.  She has very mild swelling to the  right lower extremity with good circulation, no induration or rash, and normal range of motion to the joints.  Motor strength and sensation are intact.  Ultrasound was obtained from triage and shows persistent right lower extremity DVT with no significant change from when the patient was last imaged in December.  I reviewed the past medical records in [place].  The patient was seen in the ED in December and diagnosed with this DVT.  At that time, she was not taking her Xarelto so she was restarted on it.  The patient is overall a very poor historian and seems unclear both on her chronic medical conditions and on what medication she is taking.  However the patient showed me her purse and we were able to verify all of her medications.  She currently is in position of Xarelto and states that she is taking it as prescribed.  Overall the presentation is consistent with persistent symptoms related to the DVT.  There is no evidence of cellulitis or deep soft tissue infection.  There is no significant edema or evidence of vascular compromise.  There are also no focal neurologic  abnormalities to suggest acute radiculopathy or any spinal cord lesion.  And the patient has no signs or symptoms of PE.  At this time there is no evidence of the patient's DVT burden is escalating and I think it is most appropriate for her to continue on her Xarelto.  I recommended that she elevate the leg and use compression stockings.  She has follow-up arranged with her primary care doctor at [facility] this month.  In addition, the patient has the rash on the back which appears to be a mild localized allergic rash or contact dermatitis.  I will treat with hydrocortisone.  I gave the patient (and her roommate who is with her here today) thorough return precautions, and they expressed understanding. 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    Arta Silence, MD 08/19/18 1500

## 2018-08-19 NOTE — ED Triage Notes (Signed)
Pt reports pain to right leg from knee down for the past 2 weeks. Pt denies injuries. Pt family reports they think she had a clot in the past but can't recall if she was put on blood thinners for it. Denies all other sx's.

## 2018-08-19 NOTE — ED Notes (Signed)
Attempted to call patient from lobby with no answer, pt not in flex wait.  Attempted to call mobile number without answer either.  Will attempt again in a few minutes.

## 2018-08-19 NOTE — Discharge Instructions (Signed)
Apply the hydrocortisone cream to the rash on your back and shoulder as prescribed.  Continue to take the Xarelto and your other medications.  Follow-up with your doctor at [facility] this month as planned.  Keep the leg elevated.  You can also buy compression stockings to put on it to help decrease the swelling.  Return to the ER for new, worsening, persistent severe pain, swelling, weakness or numbness, difficulty breathing, chest pain, or any other new or worsening symptoms that concern you.

## 2018-08-23 ENCOUNTER — Emergency Department: Payer: Medicare Other

## 2018-08-23 ENCOUNTER — Inpatient Hospital Stay
Admission: EM | Admit: 2018-08-23 | Discharge: 2018-08-27 | DRG: 071 | Disposition: A | Discharge status: Other Acute Inpatient Hospital | Payer: Medicare Other | Attending: Internal Medicine | Admitting: Internal Medicine

## 2018-08-23 ENCOUNTER — Other Ambulatory Visit: Payer: Self-pay

## 2018-08-23 DIAGNOSIS — G9340 Encephalopathy, unspecified: Secondary | ICD-10-CM | POA: Diagnosis not present

## 2018-08-23 DIAGNOSIS — I82531 Chronic embolism and thrombosis of right popliteal vein: Secondary | ICD-10-CM | POA: Diagnosis present

## 2018-08-23 DIAGNOSIS — Z7901 Long term (current) use of anticoagulants: Secondary | ICD-10-CM

## 2018-08-23 DIAGNOSIS — I10 Essential (primary) hypertension: Secondary | ICD-10-CM | POA: Diagnosis present

## 2018-08-23 DIAGNOSIS — R4182 Altered mental status, unspecified: Secondary | ICD-10-CM

## 2018-08-23 DIAGNOSIS — Z8249 Family history of ischemic heart disease and other diseases of the circulatory system: Secondary | ICD-10-CM

## 2018-08-23 DIAGNOSIS — E785 Hyperlipidemia, unspecified: Secondary | ICD-10-CM | POA: Diagnosis present

## 2018-08-23 DIAGNOSIS — Z87891 Personal history of nicotine dependence: Secondary | ICD-10-CM

## 2018-08-23 DIAGNOSIS — M329 Systemic lupus erythematosus, unspecified: Secondary | ICD-10-CM | POA: Diagnosis present

## 2018-08-23 DIAGNOSIS — R5383 Other fatigue: Secondary | ICD-10-CM | POA: Diagnosis present

## 2018-08-23 DIAGNOSIS — G9341 Metabolic encephalopathy: Secondary | ICD-10-CM | POA: Diagnosis present

## 2018-08-23 LAB — URINE DRUG SCREEN, QUALITATIVE (ARMC ONLY)
Amphetamines, Ur Screen: NOT DETECTED
Barbiturates, Ur Screen: NOT DETECTED
Benzodiazepine, Ur Scrn: NOT DETECTED
Cannabinoid 50 Ng, Ur ~~LOC~~: POSITIVE — AB
Cocaine Metabolite,Ur ~~LOC~~: NOT DETECTED
MDMA (Ecstasy)Ur Screen: NOT DETECTED
Methadone Scn, Ur: NOT DETECTED
Opiate, Ur Screen: NOT DETECTED
Phencyclidine (PCP) Ur S: NOT DETECTED
Tricyclic, Ur Screen: NOT DETECTED

## 2018-08-23 LAB — TROPONIN I: Troponin I: <0.03 ng/mL (ref <0.03)

## 2018-08-23 LAB — GLUCOSE, CAPILLARY: Glucose-Capillary: 79 mg/dL (ref 70–99)

## 2018-08-23 LAB — BLOOD GAS, VENOUS
Acid-Base Excess: 3.7 mmol/L — ABNORMAL HIGH (ref 0.0–2.0)
Bicarbonate: 29.7 mmol/L — ABNORMAL HIGH (ref 20.0–28.0)
O2 Saturation: 64.8 %
Patient temperature: 37
pCO2, Ven: 49 mmHg (ref 44.0–60.0)
pH, Ven: 7.39 (ref 7.250–7.430)
pO2, Ven: 34 mmHg (ref 32.0–45.0)

## 2018-08-23 LAB — COMPREHENSIVE METABOLIC PANEL
ALT: 12 U/L (ref 0–44)
AST: 19 U/L (ref 15–41)
Albumin: 4 g/dL (ref 3.5–5.0)
Alkaline Phosphatase: 55 U/L (ref 38–126)
Anion gap: 9 (ref 5–15)
BUN: 21 mg/dL — ABNORMAL HIGH (ref 6–20)
CO2: 27 mmol/L (ref 22–32)
Calcium: 9.2 mg/dL (ref 8.9–10.3)
Chloride: 102 mmol/L (ref 98–111)
Creatinine, Ser: 0.86 mg/dL (ref 0.44–1.00)
GFR calc Af Amer: >60 mL/min (ref >60)
GFR calc non Af Amer: >60 mL/min (ref >60)
Glucose, Bld: 93 mg/dL (ref 70–99)
Potassium: 4 mmol/L (ref 3.5–5.1)
Sodium: 138 mmol/L (ref 135–145)
Total Bilirubin: 0.3 mg/dL (ref 0.3–1.2)
Total Protein: 7.9 g/dL (ref 6.5–8.1)

## 2018-08-23 LAB — CBC WITH DIFFERENTIAL/PLATELET
Abs Immature Granulocytes: 0.02 10*3/uL (ref 0.00–0.07)
Basophils Absolute: 0 10*3/uL (ref 0.0–0.1)
Basophils Relative: 0 %
Eosinophils Absolute: 0 10*3/uL (ref 0.0–0.5)
Eosinophils Relative: 0 %
HCT: 44.4 % (ref 36.0–46.0)
Hemoglobin: 14.4 g/dL (ref 12.0–15.0)
Immature Granulocytes: 0 %
Lymphocytes Relative: 10 %
Lymphs Abs: 1 10*3/uL (ref 0.7–4.0)
MCH: 31.4 pg (ref 26.0–34.0)
MCHC: 32.4 g/dL (ref 30.0–36.0)
MCV: 96.7 fL (ref 80.0–100.0)
Monocytes Absolute: 0.7 10*3/uL (ref 0.1–1.0)
Monocytes Relative: 7 %
Neutro Abs: 8.4 10*3/uL — ABNORMAL HIGH (ref 1.7–7.7)
Neutrophils Relative %: 83 %
Platelets: 161 10*3/uL (ref 150–400)
RBC: 4.59 MIL/uL (ref 3.87–5.11)
RDW: 12 % (ref 11.5–15.5)
WBC: 10.1 10*3/uL (ref 4.0–10.5)
nRBC: 0 % (ref 0.0–0.2)

## 2018-08-23 LAB — URINALYSIS, COMPLETE (UACMP) WITH MICROSCOPIC
Bacteria, UA: NONE SEEN
Bilirubin Urine: NEGATIVE
Glucose, UA: NEGATIVE mg/dL
Hgb urine dipstick: NEGATIVE
Ketones, ur: 5 mg/dL — AB
Leukocytes,Ua: NEGATIVE
Nitrite: NEGATIVE
Protein, ur: NEGATIVE mg/dL
Specific Gravity, Urine: 1.03 (ref 1.005–1.030)
pH: 5 (ref 5.0–8.0)

## 2018-08-23 LAB — ETHANOL: Alcohol, Ethyl (B): <10 mg/dL (ref <10)

## 2018-08-23 LAB — AMMONIA: Ammonia: <9 umol/L — ABNORMAL LOW (ref 9–35)

## 2018-08-23 NOTE — ED Notes (Signed)
MRI tech called, one on table asked to call if tech needed

## 2018-08-23 NOTE — ED Triage Notes (Signed)
Here for AMS that started at 800 AM today. Son reports pts roommate called him and told him to come get her.  Pt unable to say anything other than yes or no.  Pt was in car and door locked and son trying to tell pt how to unlock door and she could not figure out. He reports when got there to get her she was in doorway with no shoes trying to push through him and wouldn't say anything.  Pt unable to state her own name.  Son reports she is on blood thinners.

## 2018-08-23 NOTE — ED Provider Notes (Signed)
[facility] Emergency Department Provider Note       Time seen: ----------------------------------------- 6:14 PM on 08/23/2018 -----------------------------------------  Level V caveat: History/ROS limited by altered mental status I have reviewed the triage vital signs and the nursing notes.  HISTORY   Chief Complaint Altered Mental Status    HPI Valerie Bell is a 53 y.o. female with a history of anxiety, collagen vascular disease, DVT, hypertension, lupus, spinal cord lesion who presents to the ED for mental status of unknown time of onset.  Son reports patient's roommate called him and told him to come get here today.  Patient was unable to follow any commands.  She was in the car with the door locked and the son was trying to tell her how to unlock the door and she could not figure it out.  She is unable to say her own name, recently was seen for positive DVT and was started on anticoagulants.  Past Medical History:  Diagnosis Date  . Anxiety   . Collagen vascular disease ([place])   . DVT (deep venous thrombosis) ([place])   . Hypertension   . Lupus (systemic lupus erythematosus) ([place])   . Spinal cord lesion [facility])     Patient Active Problem List   Diagnosis Date Noted  . Chest pain 03/30/2017    Past Surgical History:  Procedure Laterality Date  . ABDOMINAL HYSTERECTOMY    . BREAST BIOPSY Right 17+ years ago   negative Core Bx    Allergies Patient has no known allergies.  Social History Social History   Tobacco Use  . Smoking status: Former Smoker    Packs/day: 0.30    Years: 4.00    Pack years: 1.20    Types: Cigarettes    Last attempt to quit: 12/30/2003    Years since quitting: 14.6  . Smokeless tobacco: Never Used  Substance Use Topics  . Alcohol use: No    Alcohol/week: 0.0 standard drinks  . Drug use: Yes    Types: Marijuana    Comment: 2 days ago.    Review of Systems Unknown, positive for altered mental status  All  systems negative/normal/unremarkable except as stated in the HPI  ____________________________________________   PHYSICAL EXAM:  VITAL SIGNS: ED Triage Vitals [08/23/18 1805]  Enc Vitals Group     BP 125/87     Pulse Rate 99     Resp 15     Temp 98.6 F (37 C)     Temp Source Oral     SpO2 100 %     Weight 100 lb (45.4 kg)     Height 5\' 3"  (1.6 m)     Head Circumference      Peak Flow      Pain Score      Pain Loc      Pain Edu?      Excl. in [place]?    Constitutional: Alert but disoriented, mild distress Eyes: Conjunctivae are normal. Normal extraocular movements. ENT      Head: Normocephalic and atraumatic.      Nose: No congestion/rhinnorhea.      Mouth/Throat: Mucous membranes are moist.      Neck: No stridor. Cardiovascular: Normal rate, regular rhythm. No murmurs, rubs, or gallops. Respiratory: Normal respiratory effort without tachypnea nor retractions. Breath sounds are clear and equal bilaterally. No wheezes/rales/rhonchi. Gastrointestinal: Soft and nontender. Normal bowel sounds Musculoskeletal: Nontender with normal range of motion in extremities. No lower extremity tenderness nor edema. Neurologic: Patient  reports responds to yes with any questioning, would not follow commands.  She responded with an ouch and lifted her right arm when IV access was obtained Skin:  Skin is warm, dry and intact. No rash noted. Psychiatric: Distressed appearance, confused ____________________________________________  EKG: Interpreted by me.  Sinus rhythm the rate of 94 bpm, normal PR interval, nonspecific ST segment changes, normal QT, normal axis  ____________________________________________  ED COURSE:  As part of my medical decision making, I reviewed the following data within the [place] History obtained from family if available, nursing notes, old chart and ekg, as well as notes from prior ED visits. Patient presented for altered mental status, we will  assess with labs and imaging as indicated at this time. Clinical Course as of Aug 22 2113  Fri Aug 23, 2018  1923 Patient is able to drink water through a straw normally.   [JW]    Clinical Course User Index [JW] Earleen Newport, MD   Procedures ____________________________________________   LABS (pertinent positives/negatives)  Labs Reviewed  CBC WITH DIFFERENTIAL/PLATELET - Abnormal; Notable for the following components:      Result Value   Neutro Abs 8.4 (*)    All other components within normal limits  COMPREHENSIVE METABOLIC PANEL - Abnormal; Notable for the following components:   BUN 21 (*)    All other components within normal limits  GLUCOSE, CAPILLARY  TROPONIN I  ETHANOL  URINALYSIS, COMPLETE (UACMP) WITH MICROSCOPIC  AMMONIA  BLOOD GAS, VENOUS  URINE DRUG SCREEN, QUALITATIVE ([facility] ONLY)  CBG MONITORING, ED   CRITICAL CARE Performed by: Laurence Aly   Total critical care time: 30 minutes  Critical care time was exclusive of separately billable procedures and treating other patients.  Critical care was necessary to treat or prevent imminent or life-threatening deterioration.  Critical care was time spent personally by me on the following activities: development of treatment plan with patient and/or surrogate as well as nursing, discussions with consultants, evaluation of patient's response to treatment, examination of patient, obtaining history from patient or surrogate, ordering and performing treatments and interventions, ordering and review of laboratory studies, ordering and review of radiographic studies, pulse oximetry and re-evaluation of patient's condition.  RADIOLOGY Images were viewed by me  CT head, chest x-ray IMPRESSION: 1. No acute abnormality. 2. Minimal atrophy and minimal chronic small vessel white matter ischemic changes in both cerebral hemispheres. ____________________________________________   DIFFERENTIAL DIAGNOSIS    CVA, TIA, hemorrhage, encephalopathy, sepsis, dehydration, electrolyte abnormality, medication side effect, overdose  FINAL ASSESSMENT AND PLAN  Altered mental status   Plan: The patient had presented for altered mental status of unknown etiology. Patient's labs did not reveal any acute process. Patient's imaging was negative.  I will order an MRI, possible lupus encephalopathy but no specific etiology is known at this point.  I will discuss with the hospitalist for admission.   Laurence Aly, MD    Note: This note was generated in part or whole with voice recognition software. Voice recognition is usually quite accurate but there are transcription errors that can and very often do occur. I apologize for any typographical errors that were not detected and corrected.     Earleen Newport, MD 08/23/18 2117

## 2018-08-23 NOTE — ED Notes (Addendum)
MRI tech called, will collect pt in 10 min  Pt remains nonverbal to this RN, looks at family by bedside  Sometimes but not consistently nods or shakes head, per report from family was verbal to son when this RN not in room

## 2018-08-23 NOTE — ED Notes (Signed)
Patient transported to MRI 

## 2018-08-23 NOTE — ED Notes (Signed)
MRI tech called phone given to pt's son

## 2018-08-24 ENCOUNTER — Other Ambulatory Visit: Payer: Self-pay

## 2018-08-24 ENCOUNTER — Observation Stay: Payer: Medicare Other

## 2018-08-24 DIAGNOSIS — R5383 Other fatigue: Secondary | ICD-10-CM | POA: Diagnosis present

## 2018-08-24 LAB — SEDIMENTATION RATE: Sed Rate: 29 mm/hr (ref 0–30)

## 2018-08-24 LAB — TSH: TSH: 1.562 u[IU]/mL (ref 0.350–4.500)

## 2018-08-24 MED ORDER — ACETAMINOPHEN 650 MG RE SUPP: 650.0000 mg | Freq: Four times a day (QID) | RECTAL | Status: DC | PRN

## 2018-08-24 MED ORDER — LUBRISKIN EX LOTN: 1.0000 "application " | TOPICAL_LOTION | CUTANEOUS | Status: DC | PRN

## 2018-08-24 MED ORDER — ACETAMINOPHEN 325 MG PO TABS: 650.0000 mg | ORAL_TABLET | Freq: Four times a day (QID) | ORAL | Status: DC | PRN

## 2018-08-24 MED ORDER — ONDANSETRON HCL 4 MG PO TABS: 4.0000 mg | ORAL_TABLET | Freq: Four times a day (QID) | ORAL | Status: DC | PRN

## 2018-08-24 MED ORDER — SPIRONOLACTONE 25 MG PO TABS
25.0000 mg | ORAL_TABLET | Freq: Every day | ORAL | Status: DC
  Filled 2018-08-24: qty 1

## 2018-08-24 MED ORDER — ONDANSETRON HCL 4 MG/2ML IJ SOLN: 4.0000 mg | Freq: Four times a day (QID) | INTRAMUSCULAR | Status: DC | PRN

## 2018-08-24 MED ORDER — HYDROCORTISONE 1 % EX OINT
1.0000 "application " | TOPICAL_OINTMENT | Freq: Two times a day (BID) | CUTANEOUS | Status: DC
  Administered 2018-08-24 – 2018-08-27 (×5): 1 via TOPICAL
  Filled 2018-08-24: qty 28.35

## 2018-08-24 MED ORDER — HYDRALAZINE HCL 25 MG PO TABS
25.0000 mg | ORAL_TABLET | Freq: Three times a day (TID) | ORAL | Status: DC
  Filled 2018-08-24: qty 1

## 2018-08-24 MED ORDER — ATORVASTATIN CALCIUM 20 MG PO TABS: 80.0000 mg | ORAL_TABLET | Freq: Every day | ORAL | Status: DC

## 2018-08-24 MED ORDER — HYDROCERIN EX CREA
TOPICAL_CREAM | CUTANEOUS | Status: DC | PRN
  Filled 2018-08-24: qty 113

## 2018-08-24 MED ORDER — HYDROXYCHLOROQUINE SULFATE 200 MG PO TABS
100.0000 mg | ORAL_TABLET | Freq: Two times a day (BID) | ORAL | Status: DC
  Filled 2018-08-24: qty 1

## 2018-08-24 MED ORDER — ENSURE ENLIVE PO LIQD: 237.0000 mL | Freq: Three times a day (TID) | ORAL | Status: DC

## 2018-08-24 MED ORDER — SODIUM CHLORIDE 0.9 % IV SOLN
INTRAVENOUS | Status: DC
  Administered 2018-08-24 (×2): via INTRAVENOUS

## 2018-08-24 MED ORDER — LOSARTAN POTASSIUM 50 MG PO TABS
100.0000 mg | ORAL_TABLET | Freq: Every day | ORAL | Status: DC
  Filled 2018-08-24: qty 2

## 2018-08-24 MED ORDER — SODIUM CHLORIDE 0.9 % IV SOLN
1000.0000 mg | Freq: Every day | INTRAVENOUS | Status: DC
  Administered 2018-08-24 – 2018-08-27 (×4): 1000 mg via INTRAVENOUS
  Filled 2018-08-24 (×5): qty 8

## 2018-08-24 MED ORDER — RIVAROXABAN 20 MG PO TABS
20.0000 mg | ORAL_TABLET | Freq: Every day | ORAL | Status: DC
  Filled 2018-08-24 (×2): qty 1

## 2018-08-24 MED ORDER — IOHEXOL 350 MG/ML SOLN
75.0000 mL | Freq: Once | INTRAVENOUS | Status: AC | PRN
  Administered 2018-08-24: 75 mL via INTRAVENOUS

## 2018-08-24 MED ORDER — DOCUSATE SODIUM 100 MG PO CAPS
100.0000 mg | ORAL_CAPSULE | Freq: Every day | ORAL | Status: DC
  Filled 2018-08-24: qty 1

## 2018-08-24 NOTE — Consult Note (Signed)
Reason for Consult: Encephalopathy Referring Physician: Dr. Darrick Meigs is an 53 y.o. female.  HPI:  Patient with SLE for 2 years per roommate.  Yesterday patient woke up normally and was acting normal at breakfast with roommate.  She then went to take a shower.  When she came out, she was confused and imbalanced holding onto walls.  She was staring and non-responsive and non-verbal.  Roommate says she knows for sure she did not fall in the shower.  MRI Brain, CTA Brain/Neck are normal.  CBC, CMP, NH3, ESR, TSH, U/A, T-I were all negative or normal.  UDS is positive for THC only.    Past Medical History:  Diagnosis Date  . Anxiety   . Collagen vascular disease ([place])   . DVT (deep venous thrombosis) ([place])   . Hypertension   . Lupus (systemic lupus erythematosus) ([place])   . Spinal cord lesion [facility])     Past Surgical History:  Procedure Laterality Date  . ABDOMINAL HYSTERECTOMY    . BREAST BIOPSY Right 17+ years ago   negative Core Bx    Family History  Problem Relation Age of Onset  . Arthritis Mother   . Hypertension Mother   . Breast cancer Neg Hx     Social History:  reports that she quit smoking about 14 years ago. Her smoking use included cigarettes. She has a 1.20 pack-year smoking history. She has never used smokeless tobacco. She reports current drug use. Drug: Marijuana. She reports that she does not drink alcohol.  Allergies:  Allergies  Allergen Reactions  . Ace Inhibitors     Angioedema of face    Prior to Admission medications   Medication Sig Start Date End Date Taking? Authorizing Provider  atorvastatin (LIPITOR) 80 MG tablet Take 1 tablet (80 mg total) by mouth daily at 6 PM. 06/14/18 06/14/19 Yes Nance Pear, MD  docusate sodium (COLACE) 100 MG capsule Take 100 mg by mouth daily. 01/15/18 01/15/19 Yes [provider]  hydrALAZINE (APRESOLINE) 25 MG tablet Take 1 tablet (25 mg total) by mouth 3 (three) times daily. 06/14/18 06/14/19 Yes  Nance Pear, MD  hydrocortisone 1 % ointment Apply 1 application topically 2 (two) times daily. 08/19/18  Yes Arta Silence, MD  hydroxychloroquine (PLAQUENIL) 200 MG tablet Take 100-200 mg by mouth 2 (two) times daily.   Yes [provider]  losartan (COZAAR) 100 MG tablet Take 100 mg by mouth daily. 04/04/18  Yes [provider]  lubriskin Tilda Burrow) LOTN Apply 1 application topically every hour as needed for dry skin. 01/02/18  Yes [provider]  mometasone (ELOCON) 0.1 % cream Apply 1 application topically daily.   Yes [provider]  rivaroxaban (XARELTO) 20 MG TABS tablet Take 20 mg by mouth daily. 02/08/18 02/08/19 Yes [provider]  spironolactone (ALDACTONE) 25 MG tablet Take 1 tablet (25 mg total) by mouth daily. 06/14/18 06/14/19 Yes Nance Pear, MD  triamcinolone ointment (KENALOG) 0.1 % Apply 1 application topically 2 (two) times daily.   Yes [provider]  feeding supplement, ENSURE ENLIVE, (ENSURE ENLIVE) LIQD Take 237 mLs by mouth 3 (three) times daily between meals. 03/31/17   Loletha Grayer, MD  pantoprazole (PROTONIX) 40 MG tablet Take 1 tablet (40 mg total) by mouth daily. Patient not taking: Reported on 08/23/2018 03/31/17   Loletha Grayer, MD  spironolactone (ALDACTONE) 25 MG tablet Take 1 tablet (25 mg total) by mouth 2 (two) times daily. 12/12/16 12/12/17  Sable Feil,  PA-C    Medications:  Scheduled: . atorvastatin  80 mg Oral q1800  . docusate sodium  100 mg Oral Daily  . feeding supplement (ENSURE ENLIVE)  237 mL Oral TID BM  . hydrALAZINE  25 mg Oral TID  . hydrocortisone  1 application Topical BID  . hydroxychloroquine  100-200 mg Oral BID  . losartan  100 mg Oral Daily  . rivaroxaban  20 mg Oral Daily  . spironolactone  25 mg Oral Daily    Results for orders placed or performed during the hospital encounter of 08/23/18 (from the past 48 hour(s))  Glucose, capillary     Status: None    Collection Time: 08/23/18  6:00 PM  Result Value Ref Range   Glucose-Capillary 79 70 - 99 mg/dL  CBC with Differential     Status: Abnormal   Collection Time: 08/23/18  6:21 PM  Result Value Ref Range   WBC 10.1 4.0 - 10.5 K/uL   RBC 4.59 3.87 - 5.11 MIL/uL   Hemoglobin 14.4 12.0 - 15.0 g/dL   HCT 44.4 36.0 - 46.0 %   MCV 96.7 80.0 - 100.0 fL   MCH 31.4 26.0 - 34.0 pg   MCHC 32.4 30.0 - 36.0 g/dL   RDW 12.0 11.5 - 15.5 %   Platelets 161 150 - 400 K/uL   nRBC 0.0 0.0 - 0.2 %   Neutrophils Relative % 83 %   Neutro Abs 8.4 (H) 1.7 - 7.7 K/uL   Lymphocytes Relative 10 %   Lymphs Abs 1.0 0.7 - 4.0 K/uL   Monocytes Relative 7 %   Monocytes Absolute 0.7 0.1 - 1.0 K/uL   Eosinophils Relative 0 %   Eosinophils Absolute 0.0 0.0 - 0.5 K/uL   Basophils Relative 0 %   Basophils Absolute 0.0 0.0 - 0.1 K/uL   Immature Granulocytes 0 %   Abs Immature Granulocytes 0.02 0.00 - 0.07 K/uL    Comment: Performed at [facility], [address] [place], [place] [address]  Comprehensive metabolic panel     Status: Abnormal   Collection Time: 08/23/18  7:36 PM  Result Value Ref Range   Sodium 138 135 - 145 mmol/L   Potassium 4.0 3.5 - 5.1 mmol/L   Chloride 102 98 - 111 mmol/L   CO2 27 22 - 32 mmol/L   Glucose, Bld 93 70 - 99 mg/dL   BUN 21 (H) 6 - 20 mg/dL   Creatinine, Ser 0.86 0.44 - 1.00 mg/dL   Calcium 9.2 8.9 - 10.3 mg/dL   Total Protein 7.9 6.5 - 8.1 g/dL   Albumin 4.0 3.5 - 5.0 g/dL   AST 19 15 - 41 U/L   ALT 12 0 - 44 U/L   Alkaline Phosphatase 55 38 - 126 U/L   Total Bilirubin 0.3 0.3 - 1.2 mg/dL   GFR calc non Af Amer >60 >60 mL/min   GFR calc Af Amer >60 >60 mL/min   Anion gap 9 5 - 15    Comment: Performed at [facility], [address] [place], [place] [address]  Troponin I -     Status: None   Collection Time: 08/23/18  7:36 PM  Result Value Ref Range   Troponin I <0.03 <0.03 ng/mL    Comment: Performed at [facility], [address], [place], [place] [address]  Ethanol     Status: None   Collection Time: 08/23/18  7:36 PM  Result Value Ref Range  Alcohol, Ethyl (B) <10 <10 mg/dL    Comment: (NOTE) Lowest detectable limit for serum alcohol is 10 mg/dL. For medical purposes only. Performed at [facility], [address] [place], [place] [address]   Urinalysis, Complete w Microscopic     Status: Abnormal   Collection Time: 08/23/18  9:07 PM  Result Value Ref Range   Color, Urine AMBER (A) YELLOW    Comment: BIOCHEMICALS MAY BE AFFECTED BY COLOR   APPearance CLEAR (A) CLEAR   Specific Gravity, Urine 1.030 1.005 - 1.030   pH 5.0 5.0 - 8.0   Glucose, UA NEGATIVE NEGATIVE mg/dL   Hgb urine dipstick NEGATIVE NEGATIVE   Bilirubin Urine NEGATIVE NEGATIVE   Ketones, ur 5 (A) NEGATIVE mg/dL   Protein, ur NEGATIVE NEGATIVE mg/dL   Nitrite NEGATIVE NEGATIVE   Leukocytes,Ua NEGATIVE NEGATIVE   RBC / HPF 0-5 0 - 5 RBC/hpf   WBC, UA 0-5 0 - 5 WBC/hpf   Bacteria, UA NONE SEEN NONE SEEN   Squamous Epithelial / LPF 0-5 0 - 5   Mucus PRESENT     Comment: Performed at [facility], [address] [place], [place] [address]  Ammonia     Status: Abnormal   Collection Time: 08/23/18  9:07 PM  Result Value Ref Range   Ammonia <9 (L) 9 - 35 umol/L    Comment: Performed at [facility], [address] [place], [place] [address]  Blood gas, venous     Status: Abnormal   Collection Time: 08/23/18  9:07 PM  Result Value Ref Range   pH, Ven 7.39 7.250 - 7.430   pCO2, Ven 49 44.0 - 60.0 mmHg   pO2, Ven 34.0 32.0 - 45.0 mmHg   Bicarbonate 29.7 (H) 20.0 - 28.0 mmol/L   Acid-Base Excess 3.7 (H) 0.0 - 2.0 mmol/L   O2 Saturation 64.8 %   Patient temperature 37.0    Collection site VENOUS    Sample type VENOUS     Comment: Performed at [facility], [address], [place], [place] [address]  Urine Drug Screen, Qualitative ([facility] only)     Status: Abnormal   Collection Time: 08/23/18  9:07  PM  Result Value Ref Range   Tricyclic, Ur Screen NONE DETECTED NONE DETECTED   Amphetamines, Ur Screen NONE DETECTED NONE DETECTED   MDMA (Ecstasy)Ur Screen NONE DETECTED NONE DETECTED   Cocaine Metabolite,Ur [place] NONE DETECTED NONE DETECTED   Opiate, Ur Screen NONE DETECTED NONE DETECTED   Phencyclidine (PCP) Ur S NONE DETECTED NONE DETECTED   Cannabinoid 50 Ng, Ur [place] POSITIVE (A) NONE DETECTED   Barbiturates, Ur Screen NONE DETECTED NONE DETECTED   Benzodiazepine, Ur Scrn NONE DETECTED NONE DETECTED   Methadone Scn, Ur NONE DETECTED NONE DETECTED    Comment: (NOTE) Tricyclics + metabolites, urine    Cutoff 1000 ng/mL Amphetamines + metabolites, urine  Cutoff 1000 ng/mL MDMA (Ecstasy), urine              Cutoff 500 ng/mL Cocaine Metabolite, urine          Cutoff 300 ng/mL Opiate + metabolites, urine        Cutoff 300 ng/mL Phencyclidine (PCP), urine         Cutoff 25 ng/mL Cannabinoid, urine                 Cutoff 50 ng/mL Barbiturates + metabolites, urine  Cutoff 200 ng/mL Benzodiazepine, urine  Cutoff 200 ng/mL Methadone, urine                   Cutoff 300 ng/mL The urine drug screen provides only a preliminary, unconfirmed analytical test result and should not be used for non-medical purposes. Clinical consideration and professional judgment should be applied to any positive drug screen result due to possible interfering substances. A more specific alternate chemical method must be used in order to obtain a confirmed analytical result. Gas chromatography / mass spectrometry (GC/MS) is the preferred confirmat ory method. Performed at [facility], [address] [place], [place] [address]   TSH     Status: None   Collection Time: 08/23/18  9:07 PM  Result Value Ref Range   TSH 1.562 0.350 - 4.500 uIU/mL    Comment: Performed by a 3rd Generation assay with a functional sensitivity of <=0.01 uIU/mL. Performed at [facility], [address] [place], [place] [address]   Sedimentation rate     Status: None   Collection Time: 08/24/18 10:45 AM  Result Value Ref Range   Sed Rate 29 0 - 30 mm/hr    Comment: Performed at [facility], [place], [place] [address]    Ct Angio Head W Or Wo Contrast  Result Date: 08/24/2018 CLINICAL DATA:  Altered mental status EXAM: CT ANGIOGRAPHY HEAD AND NECK TECHNIQUE: Multidetector CT imaging of the head and neck was performed using the standard protocol during bolus administration of intravenous contrast. Multiplanar CT image reconstructions and MIPs were obtained to evaluate the vascular anatomy. Carotid stenosis measurements (when applicable) are obtained utilizing NASCET criteria, using the distal internal carotid diameter as the denominator. CONTRAST:  34m OMNIPAQUE IOHEXOL 350 MG/ML SOLN COMPARISON:  Brain MRI and head CT from yesterday FINDINGS: CTA NECK FINDINGS Aortic arch: Negative.  Three vessel branching Right carotid system: Vessels are smooth and widely patent. No atheromatous changes. Tortuosity. Left carotid system: Vessels are smooth and widely patent. No atheromatous changes. Tortuosity. Vertebral arteries: No proximal subclavian stenosis. Both vertebral arteries are smooth and widely patent to the dura. Skeleton: Negative Other neck: Negative Upper chest: Negative Review of the MIP images confirms the above findings CTA HEAD FINDINGS Anterior circulation: No atheromatous changes, beading, aneurysm, or stenosis Posterior circulation: No atheromatous changes, beading, aneurysm, or stenosis Venous sinuses: Patent Anatomic variants: None significant Delayed phase: No abnormal intracranial enhanced Review of the MIP images confirms the above findings IMPRESSION: Negative CTA of the head and neck Electronically Signed   By: JMonte FantasiaM.D.   On: 08/24/2018 11:51   Ct Head Wo Contrast  Result Date: 08/23/2018 CLINICAL DATA:  Altered mental status since 8 a.m. today.  EXAM: CT HEAD WITHOUT CONTRAST TECHNIQUE: Contiguous axial images were obtained from the base of the skull through the vertex without intravenous contrast. COMPARISON:  12/20/2017. FINDINGS: Brain: Mildly enlarged ventricles and cortical sulci. Minimal patchy white matter low density in both cerebral hemispheres. No intracranial hemorrhage, mass lesion or CT evidence of acute infarction. Vascular: No hyperdense vessel or unexpected calcification. Skull: Normal. Negative for fracture or focal lesion. Sinuses/Orbits: Unremarkable. Other: None. IMPRESSION: 1. No acute abnormality. 2. Minimal atrophy and minimal chronic small vessel white matter ischemic changes in both cerebral hemispheres. Electronically Signed   By: SClaudie ReveringM.D.   On: 08/23/2018 18:50   Ct Angio Neck W Or Wo Contrast  Result Date: 08/24/2018 CLINICAL DATA:  Altered mental status EXAM: CT ANGIOGRAPHY HEAD AND NECK  TECHNIQUE: Multidetector CT imaging of the head and neck was performed using the standard protocol during bolus administration of intravenous contrast. Multiplanar CT image reconstructions and MIPs were obtained to evaluate the vascular anatomy. Carotid stenosis measurements (when applicable) are obtained utilizing NASCET criteria, using the distal internal carotid diameter as the denominator. CONTRAST:  8m OMNIPAQUE IOHEXOL 350 MG/ML SOLN COMPARISON:  Brain MRI and head CT from yesterday FINDINGS: CTA NECK FINDINGS Aortic arch: Negative.  Three vessel branching Right carotid system: Vessels are smooth and widely patent. No atheromatous changes. Tortuosity. Left carotid system: Vessels are smooth and widely patent. No atheromatous changes. Tortuosity. Vertebral arteries: No proximal subclavian stenosis. Both vertebral arteries are smooth and widely patent to the dura. Skeleton: Negative Other neck: Negative Upper chest: Negative Review of the MIP images confirms the above findings CTA HEAD FINDINGS Anterior circulation: No  atheromatous changes, beading, aneurysm, or stenosis Posterior circulation: No atheromatous changes, beading, aneurysm, or stenosis Venous sinuses: Patent Anatomic variants: None significant Delayed phase: No abnormal intracranial enhanced Review of the MIP images confirms the above findings IMPRESSION: Negative CTA of the head and neck Electronically Signed   By: JMonte FantasiaM.D.   On: 08/24/2018 11:51   Mr Brain Wo Contrast  Result Date: 08/24/2018 CLINICAL DATA:  Altered mental status. History of lupus, hypertension. EXAM: MRI HEAD WITHOUT CONTRAST TECHNIQUE: Multiplanar, multiecho pulse sequences of the brain and surrounding structures were obtained without intravenous contrast. COMPARISON:  CT HEAD August 23, 2018 FINDINGS: Multiple sequences are moderately or severely motion degraded. INTRACRANIAL CONTENTS: No reduced diffusion to suggest acute ischemia or hyperacute demyelination. No lobar hematoma, though blood sensitive sequence is moderately motion degraded limiting sensitivity for microhemorrhage. Moderate global parenchymal brain volume loss. No hydrocephalus. No hydrocephalus. No suspicious parenchymal signal, masses, mass effect. No abnormal extra-axial fluid collections. No extra-axial masses. VASCULAR: Normal major intracranial vascular flow voids present at skull base. SKULL AND UPPER CERVICAL SPINE: No abnormal sellar expansion. No suspicious calvarial bone marrow signal. Craniocervical junction maintained. SINUSES/ORBITS: The mastoid air-cells and included paranasal sinuses are well-aerated.The included ocular globes and orbital contents are non-suspicious. OTHER: None. IMPRESSION: 1. No acute intracranial process on this motion degraded examination. 2. Moderate parenchymal brain volume loss, advanced for age. Electronically Signed   By: CElon AlasM.D.   On: 08/24/2018 00:42    ROS Blood pressure 117/89, pulse 95, temperature 99.1 F (37.3 C), temperature source Oral, resp. rate  16, height 5' 3" (1.6 m), weight 45.4 kg, last menstrual period 12/29/2001, SpO2 98 %. Neurologic Examination:   Lethargic.  Opens eyes with a lot of tactile and verbal stimulation. Non-verbal.  Does not follow commands.   PERL, No nystagmus or eye deviation.   Face symmetrical. Grimaces and withdraws to pain in all 4 extremities. Sensory, coord, gait- could not be assessed.   No babinski.  No hoffman's.  No brudzinksi or kernig's.     Assessment/Plan:  Encephalopathy without with no identified metabolic or infectious etiology.  Stroke and CNS vasculitis have been ruled out.  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  Unfortunately EEG is not available here over the weekend.  If need be, [facility]  I will treat her empirically with steroids and see if that makes any difference by tomorrow.  If not, we should consider transferring patient for continuous EEG.    Rogue Jury, MS, MD 08/24/2018, 3:04 PM

## 2018-08-24 NOTE — H&P (Signed)
Valerie Bell is an 53 y.o. female.   Chief Complaint: Altered mental status HPI: The patient with past medical history of lupus, collagen vascular disease, hypertension and recent diagnosis of DVT presents to the emergency department due to altered mental status.  The patient's roommate and son report that she has not been speaking in her usual manner.  She has also begun to speak less and is unable to say her name upon presentation to the emergency department.  Earlier in the evening when her son went to check on her he had to direct and coach her how to unlock her door.  CT of the patient's head showed no acute abnormality.  MRI of her brain also showed no stroke.  The patient continued to alternate between somnolence and lethargy.  Screen was positive only for marijuana.  Due to ongoing altered mental status emergency department staff called the hospitalist service for admission.  Past Medical History:  Diagnosis Date  . Anxiety   . Collagen vascular disease ([place])   . DVT (deep venous thrombosis) ([place])   . Hypertension   . Lupus (systemic lupus erythematosus) ([place])   . Spinal cord lesion [facility])     Past Surgical History:  Procedure Laterality Date  . ABDOMINAL HYSTERECTOMY    . BREAST BIOPSY Right 17+ years ago   negative Core Bx    Family History  Problem Relation Age of Onset  . Arthritis Mother   . Hypertension Mother   . Breast cancer Neg Hx    Social History:  reports that she quit smoking about 14 years ago. Her smoking use included cigarettes. She has a 1.20 pack-year smoking history. She has never used smokeless tobacco. She reports current drug use. Drug: Marijuana. She reports that she does not drink alcohol.  Allergies:  Allergies  Allergen Reactions  . Ace Inhibitors     Angioedema of face    Medications Prior to Admission  Medication Sig Dispense Refill  . atorvastatin (LIPITOR) 80 MG tablet Take 1 tablet (80 mg total) by mouth daily at 6 PM. 30 tablet 1  .  docusate sodium (COLACE) 100 MG capsule Take 100 mg by mouth daily.    . hydrALAZINE (APRESOLINE) 25 MG tablet Take 1 tablet (25 mg total) by mouth 3 (three) times daily. 90 tablet 1  . hydrocortisone 1 % ointment Apply 1 application topically 2 (two) times daily. 30 g 0  . hydroxychloroquine (PLAQUENIL) 200 MG tablet Take 100-200 mg by mouth 2 (two) times daily.    Marland Kitchen losartan (COZAAR) 100 MG tablet Take 100 mg by mouth daily.    Marland Kitchen lubriskin (NUTRADERM) LOTN Apply 1 application topically every hour as needed for dry skin.    . mometasone (ELOCON) 0.1 % cream Apply 1 application topically daily.    . rivaroxaban (XARELTO) 20 MG TABS tablet Take 20 mg by mouth daily.    Marland Kitchen spironolactone (ALDACTONE) 25 MG tablet Take 1 tablet (25 mg total) by mouth daily. 30 tablet 1  . triamcinolone ointment (KENALOG) 0.1 % Apply 1 application topically 2 (two) times daily.    . feeding supplement, ENSURE ENLIVE, (ENSURE ENLIVE) LIQD Take 237 mLs by mouth 3 (three) times daily between meals. 60 mL 0  . pantoprazole (PROTONIX) 40 MG tablet Take 1 tablet (40 mg total) by mouth daily. (Patient not taking: Reported on 08/23/2018) 30 tablet 0  . spironolactone (ALDACTONE) 25 MG tablet Take 1 tablet (25 mg total) by mouth 2 (two) times daily. [place]  tablet 11    Results for orders placed or performed during the hospital encounter of 08/23/18 (from the past 48 hour(s))  Glucose, capillary     Status: None   Collection Time: 08/23/18  6:00 PM  Result Value Ref Range   Glucose-Capillary 79 70 - 99 mg/dL  CBC with Differential     Status: Abnormal   Collection Time: 08/23/18  6:21 PM  Result Value Ref Range   WBC 10.1 4.0 - 10.5 K/uL   RBC 4.59 3.87 - 5.11 MIL/uL   Hemoglobin 14.4 12.0 - 15.0 g/dL   HCT 44.4 36.0 - 46.0 %   MCV 96.7 80.0 - 100.0 fL   MCH 31.4 26.0 - 34.0 pg   MCHC 32.4 30.0 - 36.0 g/dL   RDW 12.0 11.5 - 15.5 %   Platelets 161 150 - 400 K/uL   nRBC 0.0 0.0 - 0.2 %   Neutrophils Relative % 83 %    Neutro Abs 8.4 (H) 1.7 - 7.7 K/uL   Lymphocytes Relative 10 %   Lymphs Abs 1.0 0.7 - 4.0 K/uL   Monocytes Relative 7 %   Monocytes Absolute 0.7 0.1 - 1.0 K/uL   Eosinophils Relative 0 %   Eosinophils Absolute 0.0 0.0 - 0.5 K/uL   Basophils Relative 0 %   Basophils Absolute 0.0 0.0 - 0.1 K/uL   Immature Granulocytes 0 %   Abs Immature Granulocytes 0.02 0.00 - 0.07 K/uL    Comment: Performed at [facility], [address] [place], [place] [address]  Comprehensive metabolic panel     Status: Abnormal   Collection Time: 08/23/18  7:36 PM  Result Value Ref Range   Sodium 138 135 - 145 mmol/L   Potassium 4.0 3.5 - 5.1 mmol/L   Chloride 102 98 - 111 mmol/L   CO2 27 22 - 32 mmol/L   Glucose, Bld 93 70 - 99 mg/dL   BUN 21 (H) 6 - 20 mg/dL   Creatinine, Ser 0.86 0.44 - 1.00 mg/dL   Calcium 9.2 8.9 - 10.3 mg/dL   Total Protein 7.9 6.5 - 8.1 g/dL   Albumin 4.0 3.5 - 5.0 g/dL   AST 19 15 - 41 U/L   ALT 12 0 - 44 U/L   Alkaline Phosphatase 55 38 - 126 U/L   Total Bilirubin 0.3 0.3 - 1.2 mg/dL   GFR calc non Af Amer >60 >60 mL/min   GFR calc Af Amer >60 >60 mL/min   Anion gap 9 5 - 15    Comment: Performed at [facility], [address] [place], [place] [address]  Troponin I -     Status: None   Collection Time: 08/23/18  7:36 PM  Result Value Ref Range   Troponin I <0.03 <0.03 ng/mL    Comment: Performed at [facility], [address] [place], [place] [address]  Ethanol     Status: None   Collection Time: 08/23/18  7:36 PM  Result Value Ref Range   Alcohol, Ethyl (B) <10 <10 mg/dL    Comment: (NOTE) Lowest detectable limit for serum alcohol is 10 mg/dL. For medical purposes only. Performed at [facility], [address] [place], [place] [address]   Urinalysis, Complete w Microscopic     Status: Abnormal   Collection Time: 08/23/18  9:07 PM  Result Value Ref Range   Color, Urine AMBER (A) YELLOW    Comment: BIOCHEMICALS MAY BE  AFFECTED BY COLOR   APPearance  CLEAR (A) CLEAR   Specific Gravity, Urine 1.030 1.005 - 1.030   pH 5.0 5.0 - 8.0   Glucose, UA NEGATIVE NEGATIVE mg/dL   Hgb urine dipstick NEGATIVE NEGATIVE   Bilirubin Urine NEGATIVE NEGATIVE   Ketones, ur 5 (A) NEGATIVE mg/dL   Protein, ur NEGATIVE NEGATIVE mg/dL   Nitrite NEGATIVE NEGATIVE   Leukocytes,Ua NEGATIVE NEGATIVE   RBC / HPF 0-5 0 - 5 RBC/hpf   WBC, UA 0-5 0 - 5 WBC/hpf   Bacteria, UA NONE SEEN NONE SEEN   Squamous Epithelial / LPF 0-5 0 - 5   Mucus PRESENT     Comment: Performed at [facility], [address] [place], [place] [address]  Ammonia     Status: Abnormal   Collection Time: 08/23/18  9:07 PM  Result Value Ref Range   Ammonia <9 (L) 9 - 35 umol/L    Comment: Performed at [facility], [address] [place], [place] [address]  Blood gas, venous     Status: Abnormal   Collection Time: 08/23/18  9:07 PM  Result Value Ref Range   pH, Ven 7.39 7.250 - 7.430   pCO2, Ven 49 44.0 - 60.0 mmHg   pO2, Ven 34.0 32.0 - 45.0 mmHg   Bicarbonate 29.7 (H) 20.0 - 28.0 mmol/L   Acid-Base Excess 3.7 (H) 0.0 - 2.0 mmol/L   O2 Saturation 64.8 %   Patient temperature 37.0    Collection site VENOUS    Sample type VENOUS     Comment: Performed at [facility], [address], [place],  [address]  Urine Drug Screen, Qualitative ([facility] only)     Status: Abnormal   Collection Time: 08/23/18  9:07 PM  Result Value Ref Range   Tricyclic, Ur Screen NONE DETECTED NONE DETECTED   Amphetamines, Ur Screen NONE DETECTED NONE DETECTED   MDMA (Ecstasy)Ur Screen NONE DETECTED NONE DETECTED   Cocaine Metabolite,Ur [place] NONE DETECTED NONE DETECTED   Opiate, Ur Screen NONE DETECTED NONE DETECTED   Phencyclidine (PCP) Ur S NONE DETECTED NONE DETECTED   Cannabinoid 50 Ng, Ur [place] POSITIVE (A) NONE DETECTED   Barbiturates, Ur Screen NONE DETECTED NONE DETECTED   Benzodiazepine, Ur Scrn NONE DETECTED NONE DETECTED   Methadone  Scn, Ur NONE DETECTED NONE DETECTED    Comment: (NOTE) Tricyclics + metabolites, urine    Cutoff 1000 ng/mL Amphetamines + metabolites, urine  Cutoff 1000 ng/mL MDMA (Ecstasy), urine              Cutoff 500 ng/mL Cocaine Metabolite, urine          Cutoff 300 ng/mL Opiate + metabolites, urine        Cutoff 300 ng/mL Phencyclidine (PCP), urine         Cutoff 25 ng/mL Cannabinoid, urine                 Cutoff 50 ng/mL Barbiturates + metabolites, urine  Cutoff 200 ng/mL Benzodiazepine, urine              Cutoff 200 ng/mL Methadone, urine                   Cutoff 300 ng/mL The urine drug screen provides only a preliminary, unconfirmed analytical test result and should not be used for non-medical purposes. Clinical consideration and professional judgment should be applied to any positive drug screen result due to possible interfering substances. A more specific alternate chemical method must be used in  order to obtain a confirmed analytical result. Gas chromatography / mass spectrometry (GC/MS) is the preferred confirmat ory method. Performed at [facility], [place], [place] [address]    Ct Head Wo Contrast  Result Date: 08/23/2018 CLINICAL DATA:  Altered mental status since 8 a.m. today. EXAM: CT HEAD WITHOUT CONTRAST TECHNIQUE: Contiguous axial images were obtained from the base of the skull through the vertex without intravenous contrast. COMPARISON:  12/20/2017. FINDINGS: Brain: Mildly enlarged ventricles and cortical sulci. Minimal patchy white matter low density in both cerebral hemispheres. No intracranial hemorrhage, mass lesion or CT evidence of acute infarction. Vascular: No hyperdense vessel or unexpected calcification. Skull: Normal. Negative for fracture or focal lesion. Sinuses/Orbits: Unremarkable. Other: None. IMPRESSION: 1. No acute abnormality. 2. Minimal atrophy and minimal chronic small vessel white matter ischemic changes in both cerebral  hemispheres. Electronically Signed   By: Claudie Revering M.D.   On: 08/23/2018 18:50   Mr Brain Wo Contrast  Result Date: 08/24/2018 CLINICAL DATA:  Altered mental status. History of lupus, hypertension. EXAM: MRI HEAD WITHOUT CONTRAST TECHNIQUE: Multiplanar, multiecho pulse sequences of the brain and surrounding structures were obtained without intravenous contrast. COMPARISON:  CT HEAD August 23, 2018 FINDINGS: Multiple sequences are moderately or severely motion degraded. INTRACRANIAL CONTENTS: No reduced diffusion to suggest acute ischemia or hyperacute demyelination. No lobar hematoma, though blood sensitive sequence is moderately motion degraded limiting sensitivity for microhemorrhage. Moderate global parenchymal brain volume loss. No hydrocephalus. No hydrocephalus. No suspicious parenchymal signal, masses, mass effect. No abnormal extra-axial fluid collections. No extra-axial masses. VASCULAR: Normal major intracranial vascular flow voids present at skull base. SKULL AND UPPER CERVICAL SPINE: No abnormal sellar expansion. No suspicious calvarial bone marrow signal. Craniocervical junction maintained. SINUSES/ORBITS: The mastoid air-cells and included paranasal sinuses are well-aerated.The included ocular globes and orbital contents are non-suspicious. OTHER: None. IMPRESSION: 1. No acute intracranial process on this motion degraded examination. 2. Moderate parenchymal brain volume loss, advanced for age. Electronically Signed   By: Elon Alas M.D.   On: 08/24/2018 00:42    Review of Systems  Unable to perform ROS: Patient nonverbal    Blood pressure 120/87, pulse 97, temperature 98.5 F (36.9 C), temperature source Oral, resp. rate 12, height 5\' 3"  (1.6 m), weight 45.4 kg, last menstrual period 12/29/2001, SpO2 97 %. Physical Exam  Vitals reviewed. Constitutional: She is oriented to person, place, and time. She appears well-developed and well-nourished. No distress.  HENT:  Head:  Normocephalic and atraumatic.  Mouth/Throat: Oropharynx is clear and moist.  Eyes: Pupils are equal, round, and reactive to light. Conjunctivae and EOM are normal. No scleral icterus.  Neck: Normal range of motion. Neck supple. No JVD present. No tracheal deviation present. No thyromegaly present.  Cardiovascular: Normal rate, regular rhythm and normal heart sounds. Exam reveals no gallop and no friction rub.  No murmur heard. Respiratory: Effort normal and breath sounds normal.  GI: Soft. Bowel sounds are normal. She exhibits no distension. There is no abdominal tenderness.  Genitourinary:    Genitourinary Comments: Deferred   Musculoskeletal: Normal range of motion.        General: No edema.  Lymphadenopathy:    She has no cervical adenopathy.  Neurological: She is alert and oriented to person, place, and time. No cranial nerve deficit. She exhibits normal muscle tone.  Skin: Skin is warm and dry. No rash noted. No erythema.  Psychiatric: She has a normal mood and affect. Her behavior is normal. Judgment  and thought content normal.     Assessment/Plan This is a 53 year old female admitted for altered mental status. 1.  Altered mental status: Lethargy; arousable but nearly catatonic at times.  Etiology is unclear.  Differential diagnosis includes metabolic encephalopathy although the patient does not have any significant electrolyte derangements.  Check TSH and other reversible causes of delirium/altered mental status.  Her condition may be related to SLE as well.  Consider lupus cerebritis.  Consult neurology 2.  SLE: Continue Plaquenil.  Patient may need high-dose steroids.  Consider rheumatology consultation.   3.  Chronic DVT: Continue Eliquis.  It is unclear if the patient is actually been taking her medication (per her roommate).  Consult pharmacy for Eliquis dosing 4.  Hypertension: Controlled; continue hydralazine and losartan 5.  Hyperlipidemia: Continue statin therapy 6.  DVT  prophylaxis: Therapeutic anticoagulation 7.  GI prophylaxis: None The patient is a full code.  Time spent on admission orders and patient care approximately 45 minutes  Harrie Foreman, MD 08/24/2018, 5:57 AM

## 2018-08-24 NOTE — Care Management Obs Status (Signed)
[facility] NOTIFICATION   Patient Details  Name: Valerie Bell MRN: [id number] Date of Birth: 06-27-1965   Medicare Observation Status Notification Given:  Yes    Marco Adelson A Takeem Krotzer, RN 08/24/2018, 9:04 AM

## 2018-08-24 NOTE — Progress Notes (Signed)
Patient seen and examined discussed with the family Continues to be nonverbal  Patient's acute encephalopathy is unclear I have discussed with neurology who will see the patient will obtain a CTA of the brain and neck, due to history of SLE check a sed rate, ANA.  We will also check HIV and RPR.

## 2018-08-24 NOTE — Progress Notes (Signed)
Patient responds to discomfort only.  She resists when I move her. She does not make eye contact, even with her family members.

## 2018-08-24 NOTE — ED Notes (Addendum)
.  This tech in pt room at this time to check on pt, pt with no concerns at this time, pt with bed lowered, call light in reach, pt offered a warm blanket and accepted for herself and a family member, pt not needing to use the restroom or be changed at this time, pt reminded that if a need arises the call light is within reach and to call for help. This tech will continue to monitor pt from monitors at nursing station and will continue to check in with pt hourly

## 2018-08-25 LAB — HEPARIN LEVEL (UNFRACTIONATED)
Heparin Unfractionated: <0.1 IU/mL — ABNORMAL LOW (ref 0.30–0.70)
Heparin Unfractionated: 1.07 IU/mL — ABNORMAL HIGH (ref 0.30–0.70)

## 2018-08-25 LAB — CBC
HCT: 39.5 % (ref 36.0–46.0)
Hemoglobin: 13.2 g/dL (ref 12.0–15.0)
MCH: 31.9 pg (ref 26.0–34.0)
MCHC: 33.4 g/dL (ref 30.0–36.0)
MCV: 95.4 fL (ref 80.0–100.0)
Platelets: 140 10*3/uL — ABNORMAL LOW (ref 150–400)
RBC: 4.14 MIL/uL (ref 3.87–5.11)
RDW: 12 % (ref 11.5–15.5)
WBC: 10.1 10*3/uL (ref 4.0–10.5)
nRBC: 0 % (ref 0.0–0.2)

## 2018-08-25 LAB — PROTIME-INR
INR: 1.3 — ABNORMAL HIGH (ref 0.8–1.2)
Prothrombin Time: 16.2 seconds — ABNORMAL HIGH (ref 11.4–15.2)

## 2018-08-25 LAB — APTT: aPTT: 30 seconds (ref 24–36)

## 2018-08-25 MED ORDER — HEPARIN (PORCINE) 25000 UT/250ML-% IV SOLN: 700.0000 [IU]/h | INTRAVENOUS | Status: DC

## 2018-08-25 MED ORDER — HEPARIN (PORCINE) 25000 UT/250ML-% IV SOLN
500.0000 [IU]/h | INTRAVENOUS | Status: DC
  Administered 2018-08-25: 700 [IU]/h via INTRAVENOUS
  Administered 2018-08-26: 500 [IU]/h via INTRAVENOUS
  Filled 2018-08-25 (×2): qty 250

## 2018-08-25 MED ORDER — HEPARIN BOLUS VIA INFUSION
2500.0000 [IU] | Freq: Once | INTRAVENOUS | Status: AC
  Administered 2018-08-25: 2500 [IU] via INTRAVENOUS
  Filled 2018-08-25: qty 2500

## 2018-08-25 MED ORDER — SODIUM CHLORIDE 0.9 % IV SOLN: 1000.0000 mg | Freq: Every day | INTRAVENOUS | Status: DC

## 2018-08-25 NOTE — Discharge Summary (Signed)
[place] at [facility], 53 y.o., DOB 1965-12-18, MRN [id number]. Admission date: 08/23/2018 Discharge Date 08/25/2018 Primary MD [facility] Admitting Physician Harrie Foreman, MD  Admission Diagnosis  Altered mental status, unspecified altered mental status type [R41.82]  Discharge Diagnosis   Active Problems: Acute encephalopathy of unclear etiology SLE Chronic DVT Essential hypertension Hyperlipidemia   Hospital Course Patient is a 53 year old with history of lupus, chronic DVT who was seen in the emergency room on March 2 with complaint of right lower extremity swelling and was noted to have a chronically occlusive DVT.  She was continued on Xarelto.  Patient was brought back to the emergency room with altered mental status on the morning of 3/07.  Patient did not have any fevers or chills.  Evaluation in the emergency room including a CT and MRI brain was negative.  Patient was admitted for further evaluation.  She was seen in consultation by neurology.  She was started on steroids for possible vasculitis or cerebritis related to lupus.  Patient has not shown much improvement therefore recommended to be transferred to [facility] for continuous EEG.  Patient may also need a lumbar puncture.  I have discussed the case with the on-call hospitalist and on-call neurologist at [facility].             Consults  neurology  Significant Tests:  See full reports for all details     Ct Angio Head W Or Wo Contrast  Result Date: 08/24/2018 CLINICAL DATA:  Altered mental status EXAM: CT ANGIOGRAPHY HEAD AND NECK TECHNIQUE: Multidetector CT imaging of the head and neck was performed using the standard protocol during bolus administration of intravenous contrast. Multiplanar CT image reconstructions and MIPs were obtained to evaluate the vascular anatomy. Carotid stenosis measurements (when applicable) are obtained utilizing NASCET  criteria, using the distal internal carotid diameter as the denominator. CONTRAST:  33mL OMNIPAQUE IOHEXOL 350 MG/ML SOLN COMPARISON:  Brain MRI and head CT from yesterday FINDINGS: CTA NECK FINDINGS Aortic arch: Negative.  Three vessel branching Right carotid system: Vessels are smooth and widely patent. No atheromatous changes. Tortuosity. Left carotid system: Vessels are smooth and widely patent. No atheromatous changes. Tortuosity. Vertebral arteries: No proximal subclavian stenosis. Both vertebral arteries are smooth and widely patent to the dura. Skeleton: Negative Other neck: Negative Upper chest: Negative Review of the MIP images confirms the above findings CTA HEAD FINDINGS Anterior circulation: No atheromatous changes, beading, aneurysm, or stenosis Posterior circulation: No atheromatous changes, beading, aneurysm, or stenosis Venous sinuses: Patent Anatomic variants: None significant Delayed phase: No abnormal intracranial enhanced Review of the MIP images confirms the above findings IMPRESSION: Negative CTA of the head and neck Electronically Signed   By: Monte Fantasia M.D.   On: 08/24/2018 11:51   Ct Head Wo Contrast  Result Date: 08/23/2018 CLINICAL DATA:  Altered mental status since 8 a.m. today. EXAM: CT HEAD WITHOUT CONTRAST TECHNIQUE: Contiguous axial images were obtained from the base of the skull through the vertex without intravenous contrast. COMPARISON:  12/20/2017. FINDINGS: Brain: Mildly enlarged ventricles and cortical sulci. Minimal patchy white matter low density in both cerebral hemispheres. No intracranial hemorrhage, mass lesion or CT evidence of acute infarction. Vascular: No hyperdense vessel or unexpected calcification. Skull: Normal. Negative for fracture or focal lesion. Sinuses/Orbits: Unremarkable. Other: None. IMPRESSION: 1. No acute abnormality. 2. Minimal atrophy and minimal chronic small vessel white matter ischemic changes in both cerebral hemispheres. Electronically  Signed   By: Claudie Revering M.D.   On: 08/23/2018 18:50   Ct Angio Neck W Or Wo Contrast  Result Date: 08/24/2018 CLINICAL DATA:  Altered mental status EXAM: CT ANGIOGRAPHY HEAD AND NECK TECHNIQUE: Multidetector CT imaging of the head and neck was performed using the standard protocol during bolus administration of intravenous contrast. Multiplanar CT image reconstructions and MIPs were obtained to evaluate the vascular anatomy. Carotid stenosis measurements (when applicable) are obtained utilizing NASCET criteria, using the distal internal carotid diameter as the denominator. CONTRAST:  47mL OMNIPAQUE IOHEXOL 350 MG/ML SOLN COMPARISON:  Brain MRI and head CT from yesterday FINDINGS: CTA NECK FINDINGS Aortic arch: Negative.  Three vessel branching Right carotid system: Vessels are smooth and widely patent. No atheromatous changes. Tortuosity. Left carotid system: Vessels are smooth and widely patent. No atheromatous changes. Tortuosity. Vertebral arteries: No proximal subclavian stenosis. Both vertebral arteries are smooth and widely patent to the dura. Skeleton: Negative Other neck: Negative Upper chest: Negative Review of the MIP images confirms the above findings CTA HEAD FINDINGS Anterior circulation: No atheromatous changes, beading, aneurysm, or stenosis Posterior circulation: No atheromatous changes, beading, aneurysm, or stenosis Venous sinuses: Patent Anatomic variants: None significant Delayed phase: No abnormal intracranial enhanced Review of the MIP images confirms the above findings IMPRESSION: Negative CTA of the head and neck Electronically Signed   By: Monte Fantasia M.D.   On: 08/24/2018 11:51   Mr Brain Wo Contrast  Result Date: 08/24/2018 CLINICAL DATA:  Altered mental status. History of lupus, hypertension. EXAM: MRI HEAD WITHOUT CONTRAST TECHNIQUE: Multiplanar, multiecho pulse sequences of the brain and surrounding structures were obtained without intravenous contrast. COMPARISON:  CT HEAD  August 23, 2018 FINDINGS: Multiple sequences are moderately or severely motion degraded. INTRACRANIAL CONTENTS: No reduced diffusion to suggest acute ischemia or hyperacute demyelination. No lobar hematoma, though blood sensitive sequence is moderately motion degraded limiting sensitivity for microhemorrhage. Moderate global parenchymal brain volume loss. No hydrocephalus. No hydrocephalus. No suspicious parenchymal signal, masses, mass effect. No abnormal extra-axial fluid collections. No extra-axial masses. VASCULAR: Normal major intracranial vascular flow voids present at skull base. SKULL AND UPPER CERVICAL SPINE: No abnormal sellar expansion. No suspicious calvarial bone marrow signal. Craniocervical junction maintained. SINUSES/ORBITS: The mastoid air-cells and included paranasal sinuses are well-aerated.The included ocular globes and orbital contents are non-suspicious. OTHER: None. IMPRESSION: 1. No acute intracranial process on this motion degraded examination. 2. Moderate parenchymal brain volume loss, advanced for age. Electronically Signed   By: Elon Alas M.D.   On: 08/24/2018 00:42   US Venous Img Lower Unilateral Right  Result Date: 08/19/2018 CLINICAL DATA:  History of right lower extremity DVT, now with worsening right lower extremity pain and edema. Evaluate for acute or chronic DVT EXAM: RIGHT LOWER EXTREMITY VENOUS DOPPLER ULTRASOUND TECHNIQUE: Gray-scale sonography with graded compression, as well as color Doppler and duplex ultrasound were performed to evaluate the lower extremity deep venous systems from the level of the common femoral vein and including the common femoral, femoral, profunda femoral, popliteal and calf veins including the posterior tibial, peroneal and gastrocnemius veins when visible. The superficial great saphenous vein was also interrogated. Spectral Doppler was utilized to evaluate flow at rest and with distal augmentation maneuvers in the common femoral, femoral  and popliteal veins. COMPARISON:  Right lower extremity venous Doppler ultrasound-06/14/2018 FINDINGS: Contralateral Common Femoral Vein: Respiratory phasicity is normal and symmetric with the symptomatic side. No evidence of thrombus. Normal compressibility. Common Femoral Vein: There is grossly  unchanged mixed echogenic nonocclusive wall thickening/chronic DVT within the right common femoral vein (image 10), similar to the 05/2018 examination. Saphenofemoral Junction: There is echogenic occlusive thrombus involving the right saphenofemoral junction (image 13), grossly unchanged compared to the 05/2018 examination. Profunda Femoral Vein: Appears patent where imaged. Femoral Vein: There is echogenic occlusive thrombus involving the proximal (image 19), mid (image 22) and distal (image 26) aspects of the right femoral vein, similar to the 05/2018 examination. Popliteal Vein: There is hypoechoic near occlusive wall thickening/chronic DVT involving the right popliteal vein (images 29 and 31), unchanged to slightly improved compared to the 05/2018 examination. Calf Veins: There is hypoechoic occlusive thrombus involving both paired right posterior tibial as well as the right peroneal veins, grossly unchanged compared to the 05/2018 examination. Superficial Great Saphenous Vein: No evidence of thrombus. Normal compressibility. Venous Reflux:  None. Other Findings:  None. IMPRESSION: Grossly unchanged extensive predominantly occlusive DVT extending from the right common femoral vein through the right tibial veins, similar to the 05/2018 examination. Electronically Signed   By: Sandi Mariscal M.D.   On: 08/19/2018 12:30       Today   Subjective:   Valerie Bell   patient nonverbal  Objective:   Blood pressure 109/79, pulse 85, temperature 98 F (36.7 C), temperature source Axillary, resp. rate 18, height 5\' 3"  (1.6 m), weight 45.4 kg, last menstrual period 12/29/2001, SpO2 100 %.  .  Intake/Output Summary (Last  24 hours) at 08/25/2018 1134 Last data filed at 08/25/2018 0800 Gross per 24 hour  Intake 2627.24 ml  Output -  Net 2627.24 ml    Exam VITAL SIGNS: Blood pressure 109/79, pulse 85, temperature 98 F (36.7 C), temperature source Axillary, resp. rate 18, height 5\' 3"  (1.6 m), weight 45.4 kg, last menstrual period 12/29/2001, SpO2 100 %.  GENERAL:  53 y.o.-year-old patient lying in the bed with no acute distress.  EYES: Pupils equal, round, reactive to light and accommodation. No scleral icterus. Extraocular muscles intact.  HEENT: Head atraumatic, normocephalic. Oropharynx and nasopharynx clear.  NECK:  Supple, no jugular venous distention. No thyroid enlargement, no tenderness.  LUNGS: Normal breath sounds bilaterally, no wheezing, rales,rhonchi or crepitation. No use of accessory muscles of respiration.  CARDIOVASCULAR: S1, S2 normal. No murmurs, rubs, or gallops.  ABDOMEN: Soft, nontender, nondistended. Bowel sounds present. No organomegaly or mass.  EXTREMITIES: No pedal edema, cyanosis, or clubbing.  NEUROLOGIC: Not following commands PSYCHIATRIC: Alert but not oriented SKIN: No obvious rash, lesion, or ulcer.   Data Review     CBC w Diff:  Lab Results  Component Value Date   WBC 10.1 08/23/2018   HGB 14.4 08/23/2018   HGB 13.5 07/13/2013   HCT 44.4 08/23/2018   HCT 40.4 07/13/2013   PLT 161 08/23/2018   PLT 160 07/13/2013   LYMPHOPCT 10 08/23/2018   LYMPHOPCT 26.3 07/13/2013   MONOPCT 7 08/23/2018   MONOPCT 11.3 07/13/2013   EOSPCT 0 08/23/2018   EOSPCT 1.4 07/13/2013   BASOPCT 0 08/23/2018   BASOPCT 0.3 07/13/2013   CMP:  Lab Results  Component Value Date   NA 138 08/23/2018   NA 136 07/13/2013   K 4.0 08/23/2018   K 3.4 (L) 07/13/2013   CL 102 08/23/2018   CL 107 07/13/2013   CO2 27 08/23/2018   CO2 23 07/13/2013   BUN 21 (H) 08/23/2018   BUN 19 (H) 07/13/2013   CREATININE 0.86 08/23/2018   CREATININE 0.82 07/13/2013   PROT 7.9 08/23/2018  PROT 6.5  07/13/2013   ALBUMIN 4.0 08/23/2018   ALBUMIN 3.1 (L) 07/13/2013   BILITOT 0.3 08/23/2018   BILITOT 0.5 07/13/2013   ALKPHOS 55 08/23/2018   ALKPHOS 34 (L) 07/13/2013   AST 19 08/23/2018   AST 22 07/13/2013   ALT 12 08/23/2018   ALT 10 (L) 07/13/2013  .  Micro Results No results found for this or any previous visit (from the past 240 hour(s)).      Code Status Orders  (From admission, onward)         Start     Ordered   08/24/18 0527  Full code  Continuous     08/24/18 0526        Code Status History    Date Active Date Inactive Code Status Order ID Comments User Context   03/30/2017 1122 03/31/2017 1647 Full Code [id number]  Henreitta Leber, MD Inpatient            Discharge Medications   Allergies as of 08/25/2018      Reactions   Ace Inhibitors    Angioedema of face      Medication List    STOP taking these medications   atorvastatin 80 MG tablet Commonly known as:  Lipitor   docusate sodium 100 MG capsule Commonly known as:  COLACE   feeding supplement (ENSURE ENLIVE) Liqd   hydrALAZINE 25 MG tablet Commonly known as:  APRESOLINE   hydrocortisone 1 % ointment   hydroxychloroquine 200 MG tablet Commonly known as:  PLAQUENIL   losartan 100 MG tablet Commonly known as:  COZAAR   lubriskin Lotn   mometasone 0.1 % cream Commonly known as:  ELOCON   pantoprazole 40 MG tablet Commonly known as:  PROTONIX   rivaroxaban 20 MG Tabs tablet Commonly known as:  XARELTO   spironolactone 25 MG tablet Commonly known as:  Aldactone   triamcinolone ointment 0.1 % Commonly known as:  KENALOG     TAKE these medications   heparin 25000-0.45 UT/250ML-% infusion Inject 700 Units/hr into the vein continuous.   methylPREDNISolone sodium succinate 1,000 mg in sodium chloride 0.9 % 50 mL Inject 1,000 mg into the vein daily. Start taking on:  August 26, 2018          Total Time in preparing paper work, data evaluation and todays exam - 69  minutes  Dustin Flock M.D on 08/25/2018 at 11:34 AM [employer] [facility]   Office  [phone]

## 2018-08-25 NOTE — Progress Notes (Addendum)
[place] for heparin Indication: DVT  Allergies  Allergen Reactions  . Ace Inhibitors     Angioedema of face    Patient Measurements: Height: 5\' 3"  (160 cm) Weight: 100 lb (45.4 kg) IBW/kg (Calculated) : 52.4 Heparin Dosing Weight: 45.4 kg  Vital Signs: Temp: 98 F (36.7 C) (03/08 0800) Temp Source: Axillary (03/08 0800) BP: 109/79 (03/08 0800) Pulse Rate: 85 (03/08 0800)  Labs: Recent Labs    08/23/18 1821 08/23/18 1936  HGB 14.4  --   HCT 44.4  --   PLT 161  --   CREATININE  --  0.86  TROPONINI  --  <0.03    Estimated Creatinine Clearance: 54.8 mL/min (by C-G formula based on SCr of 0.86 mg/dL).   Medical History: Past Medical History:  Diagnosis Date  . Anxiety   . Collagen vascular disease ([place])   . DVT (deep venous thrombosis) ([place])   . Hypertension   . Lupus (systemic lupus erythematosus) ([place])   . Spinal cord lesion [facility])     Assessment: 53 year old female on Xarelto 20 mg daily PTA. Last dose reported 3/5 at 0800. Pharmacy consulted for heparin dosing for VTE treatment.  Goal of Therapy:  Heparin level 0.3-0.7 units/ml Monitor platelets by anticoagulation protocol: Yes   Plan:  Although patient's last dose of Xarelto reported > 72 hours ago, will include baseline HL with other baseline labs. Labs to be drawn prior to start of heparin.  Heparin 2500 unit bolus followed by heparin drip at 700 units/hr. Will order HL 6 hours after start of heparin drip at 1800.  CBC with morning labs.  Tawnya Crook, PharmD Pharmacy Resident  08/25/2018 11:27 AM

## 2018-08-25 NOTE — Progress Notes (Signed)
Subjective: Interval History: Significantly improved today after received IV Solumedrol yesterday.  However, still not back to baseline.  Roommate and son said that she said a few small sentences to them.    Objective: Vital signs in last 24 hours: Temp:  [97.6 F (36.4 C)-99.8 F (37.7 C)] 98 F (36.7 C) (03/08 0800) Pulse Rate:  [85-101] 85 (03/08 0800) Resp:  [16-18] 18 (03/08 0800) BP: (101-112)/(71-88) 109/79 (03/08 0800) SpO2:  [99 %-100 %] 100 % (03/08 0800)  Intake/Output from previous day: 03/07 0701 - 03/08 0700 In: 1287.2 [I.V.:1237.2; IV Piggyback:50] Out: -  Intake/Output this shift: Total I/O In: 1340 [I.V.:1340] Out: -  Nutritional status:  Diet Order            Diet NPO time specified  Diet effective now              Neurologic Exam:  Awake and alert today. Making good eye contact. PERL.  Looking in all directions.   Not following commands, but attempting to talk without success. She is aware of people around her. She withdraws and grimaces significantly to pain in all 4 extremities. No babinksi.  No hoffman's.  Lab Results: Recent Labs    08/23/18 1821 08/23/18 1936 08/25/18 1133  WBC 10.1  --  10.1  HGB 14.4  --  13.2  HCT 44.4  --  39.5  PLT 161  --  140*  NA  --  138  --   K  --  4.0  --   CL  --  102  --   CO2  --  27  --   GLUCOSE  --  93  --   BUN  --  21*  --   CREATININE  --  0.86  --   CALCIUM  --  9.2  --    Lipid Panel No results for input(s): CHOL, TRIG, HDL, CHOLHDL, VLDL, LDLCALC in the last 72 hours.  Studies/Results: Ct Angio Head W Or Wo Contrast  Result Date: 08/24/2018 CLINICAL DATA:  Altered mental status EXAM: CT ANGIOGRAPHY HEAD AND NECK TECHNIQUE: Multidetector CT imaging of the head and neck was performed using the standard protocol during bolus administration of intravenous contrast. Multiplanar CT image reconstructions and MIPs were obtained to evaluate the vascular anatomy. Carotid stenosis measurements (when  applicable) are obtained utilizing NASCET criteria, using the distal internal carotid diameter as the denominator. CONTRAST:  91mL OMNIPAQUE IOHEXOL 350 MG/ML SOLN COMPARISON:  Brain MRI and head CT from yesterday FINDINGS: CTA NECK FINDINGS Aortic arch: Negative.  Three vessel branching Right carotid system: Vessels are smooth and widely patent. No atheromatous changes. Tortuosity. Left carotid system: Vessels are smooth and widely patent. No atheromatous changes. Tortuosity. Vertebral arteries: No proximal subclavian stenosis. Both vertebral arteries are smooth and widely patent to the dura. Skeleton: Negative Other neck: Negative Upper chest: Negative Review of the MIP images confirms the above findings CTA HEAD FINDINGS Anterior circulation: No atheromatous changes, beading, aneurysm, or stenosis Posterior circulation: No atheromatous changes, beading, aneurysm, or stenosis Venous sinuses: Patent Anatomic variants: None significant Delayed phase: No abnormal intracranial enhanced Review of the MIP images confirms the above findings IMPRESSION: Negative CTA of the head and neck Electronically Signed   By: Monte Fantasia M.D.   On: 08/24/2018 11:51   Ct Head Wo Contrast  Result Date: 08/23/2018 CLINICAL DATA:  Altered mental status since 8 a.m. today. EXAM: CT HEAD WITHOUT CONTRAST TECHNIQUE: Contiguous axial images were obtained from the base  of the skull through the vertex without intravenous contrast. COMPARISON:  12/20/2017. FINDINGS: Brain: Mildly enlarged ventricles and cortical sulci. Minimal patchy white matter low density in both cerebral hemispheres. No intracranial hemorrhage, mass lesion or CT evidence of acute infarction. Vascular: No hyperdense vessel or unexpected calcification. Skull: Normal. Negative for fracture or focal lesion. Sinuses/Orbits: Unremarkable. Other: None. IMPRESSION: 1. No acute abnormality. 2. Minimal atrophy and minimal chronic small vessel white matter ischemic changes in  both cerebral hemispheres. Electronically Signed   By: Claudie Revering M.D.   On: 08/23/2018 18:50   Ct Angio Neck W Or Wo Contrast  Result Date: 08/24/2018 CLINICAL DATA:  Altered mental status EXAM: CT ANGIOGRAPHY HEAD AND NECK TECHNIQUE: Multidetector CT imaging of the head and neck was performed using the standard protocol during bolus administration of intravenous contrast. Multiplanar CT image reconstructions and MIPs were obtained to evaluate the vascular anatomy. Carotid stenosis measurements (when applicable) are obtained utilizing NASCET criteria, using the distal internal carotid diameter as the denominator. CONTRAST:  90mL OMNIPAQUE IOHEXOL 350 MG/ML SOLN COMPARISON:  Brain MRI and head CT from yesterday FINDINGS: CTA NECK FINDINGS Aortic arch: Negative.  Three vessel branching Right carotid system: Vessels are smooth and widely patent. No atheromatous changes. Tortuosity. Left carotid system: Vessels are smooth and widely patent. No atheromatous changes. Tortuosity. Vertebral arteries: No proximal subclavian stenosis. Both vertebral arteries are smooth and widely patent to the dura. Skeleton: Negative Other neck: Negative Upper chest: Negative Review of the MIP images confirms the above findings CTA HEAD FINDINGS Anterior circulation: No atheromatous changes, beading, aneurysm, or stenosis Posterior circulation: No atheromatous changes, beading, aneurysm, or stenosis Venous sinuses: Patent Anatomic variants: None significant Delayed phase: No abnormal intracranial enhanced Review of the MIP images confirms the above findings IMPRESSION: Negative CTA of the head and neck Electronically Signed   By: Monte Fantasia M.D.   On: 08/24/2018 11:51   Mr Brain Wo Contrast  Result Date: 08/24/2018 CLINICAL DATA:  Altered mental status. History of lupus, hypertension. EXAM: MRI HEAD WITHOUT CONTRAST TECHNIQUE: Multiplanar, multiecho pulse sequences of the brain and surrounding structures were obtained without  intravenous contrast. COMPARISON:  CT HEAD August 23, 2018 FINDINGS: Multiple sequences are moderately or severely motion degraded. INTRACRANIAL CONTENTS: No reduced diffusion to suggest acute ischemia or hyperacute demyelination. No lobar hematoma, though blood sensitive sequence is moderately motion degraded limiting sensitivity for microhemorrhage. Moderate global parenchymal brain volume loss. No hydrocephalus. No hydrocephalus. No suspicious parenchymal signal, masses, mass effect. No abnormal extra-axial fluid collections. No extra-axial masses. VASCULAR: Normal major intracranial vascular flow voids present at skull base. SKULL AND UPPER CERVICAL SPINE: No abnormal sellar expansion. No suspicious calvarial bone marrow signal. Craniocervical junction maintained. SINUSES/ORBITS: The mastoid air-cells and included paranasal sinuses are well-aerated.The included ocular globes and orbital contents are non-suspicious. OTHER: None. IMPRESSION: 1. No acute intracranial process on this motion degraded examination. 2. Moderate parenchymal brain volume loss, advanced for age. Electronically Signed   By: Elon Alas M.D.   On: 08/24/2018 00:42    Medications:  Scheduled: . feeding supplement (ENSURE ENLIVE)  237 mL Oral TID BM  . 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  She has improved significantly in alertness, attention, and mental ability since yesterday.  However, she is not back to normal yet.  I would continue the IV Solumedrol  1000 for 3-5 days total. She will be transferred to [facility] for continuous EEG monitoring as well.     LOS: 0 days   Rogue Jury, MS, MD 08/25/2018  1:07 PM

## 2018-08-25 NOTE — Progress Notes (Signed)
[place] for heparin Indication: DVT  Allergies  Allergen Reactions  . Ace Inhibitors     Angioedema of face    Patient Measurements: Height: 5\' 3"  (160 cm) Weight: 100 lb (45.4 kg) IBW/kg (Calculated) : 52.4 Heparin Dosing Weight: 45.4 kg  Vital Signs: Temp: 99.1 F (37.3 C) (03/08 1558) Temp Source: Oral (03/08 1558) BP: 122/93 (03/08 1558) Pulse Rate: 77 (03/08 1558)  Labs: Recent Labs    08/23/18 1821 08/23/18 1936 08/25/18 1133 08/25/18 1759  HGB 14.4  --  13.2  --   HCT 44.4  --  39.5  --   PLT 161  --  140*  --   APTT  --   --  30  --   LABPROT  --   --  16.2*  --   INR  --   --  1.3*  --   HEPARINUNFRC  --   --  <0.10* 1.07*  CREATININE  --  0.86  --   --   TROPONINI  --  <0.03  --   --     Estimated Creatinine Clearance: 54.8 mL/min (by C-G formula based on SCr of 0.86 mg/dL).   Medical History: Past Medical History:  Diagnosis Date  . Anxiety   . Collagen vascular disease ([place])   . DVT (deep venous thrombosis) ([place])   . Hypertension   . Lupus (systemic lupus erythematosus) ([place])   . Spinal cord lesion [facility])     Assessment: 53 year old female on Xarelto 20 mg daily PTA. Last dose reported 3/5 at 0800. Pharmacy consulted for heparin dosing for VTE treatment.Heparin 2500 unit bolus followed by heparin drip at 700 units/hr. Baseline heparin < 0.10.   3/8 @1759  HL = 1.07   Goal of Therapy:  Heparin level 0.3-0.7 units/ml Monitor platelets by anticoagulation protocol: Yes   Plan:  Will hold the infusion for 1 hour (called and notified the RN). Will decrease the rate to 550 units/hr. Will order heparin level in 6 hours. CBC with morning labs.  Oswald Hillock, PharmD, BCPS  08/25/2018 7:22 PM

## 2018-08-26 DIAGNOSIS — R4182 Altered mental status, unspecified: Secondary | ICD-10-CM

## 2018-08-26 LAB — CBC
HCT: 36.8 % (ref 36.0–46.0)
Hemoglobin: 12.1 g/dL (ref 12.0–15.0)
MCH: 32 pg (ref 26.0–34.0)
MCHC: 32.9 g/dL (ref 30.0–36.0)
MCV: 97.4 fL (ref 80.0–100.0)
Platelets: 125 10*3/uL — ABNORMAL LOW (ref 150–400)
RBC: 3.78 MIL/uL — ABNORMAL LOW (ref 3.87–5.11)
RDW: 12.2 % (ref 11.5–15.5)
WBC: 17 10*3/uL — ABNORMAL HIGH (ref 4.0–10.5)
nRBC: 0 % (ref 0.0–0.2)

## 2018-08-26 LAB — HEPARIN LEVEL (UNFRACTIONATED)
Heparin Unfractionated: 0.71 IU/mL — ABNORMAL HIGH (ref 0.30–0.70)
Heparin Unfractionated: 0.62 IU/mL (ref 0.30–0.70)
Heparin Unfractionated: 0.58 IU/mL (ref 0.30–0.70)

## 2018-08-26 NOTE — Progress Notes (Signed)
Valerie Bell, is a 53 y.o. female, DOB - 1966/01/01, XBJ:[id number]  Admit date - 08/23/2018   Admitting Physician Harrie Foreman, MD  Outpatient Primary MD for the patient is [place], [facility]   LOS - 0  Subjective: Patient was scheduled to be transferred to [facility] but no bed available yet    Review of Systems:   CONSTITUTIONAL: Not communicating   Vitals:   Vitals:   08/24/18 2332 08/25/18 0800 08/25/18 1558 08/25/18 2319  BP: 101/71 109/79 (!) 122/93 (!) 147/95  Pulse: 85 85 77 74  Resp: 18 18  18   Temp:  98 F (36.7 C) 99.1 F (37.3 C) (!) 97.2 F (36.2 C)  TempSrc: Axillary Axillary Oral Axillary  SpO2: 99% 100% 100% 99%  Weight:      Height:        Wt Readings from Last 3 Encounters:  08/23/18 45.4 kg  08/19/18 45.4 kg  06/14/18 41.3 kg     Intake/Output Summary (Last 24 hours) at 08/26/2018 1351 Last data filed at 08/25/2018 1700 Gross per 24 hour  Intake 900 ml  Output -  Net 900 ml    Physical Exam:   GENERAL: Pleasant-appearing in no apparent distress.  HEAD, EYES, EARS, NOSE AND THROAT: Atraumatic, normocephalic. Extraocular muscles are intact. Pupils equal and reactive to light. Sclerae anicteric. No conjunctival injection. No oro-pharyngeal erythema.  NECK: Supple. There is no jugular venous distention. No bruits, no lymphadenopathy, no thyromegaly.  HEART: Regular rate and rhythm,. No murmurs, no rubs, no clicks.  LUNGS: Clear to auscultation bilaterally. No rales or rhonchi. No wheezes.  ABDOMEN: Soft, flat, nontender, nondistended. Has good bowel sounds. No hepatosplenomegaly appreciated.  EXTREMITIES: No evidence of any cyanosis, clubbing, or peripheral edema.  +2 pedal and radial  pulses bilaterally.  NEUROLOGIC: Unable to communicate SKIN: Moist and warm with no rashes appreciated.  Psych: Not anxious, depressed LN: No inguinal LN enlargement    Antibiotics   Anti-infectives (From admission, onward)   Start     Dose/Rate Route Frequency Ordered Stop   08/24/18 1000  hydroxychloroquine (PLAQUENIL) tablet 100-200 mg  Status:  Discontinued     100-200 mg Oral 2 times daily 08/24/18 0526 08/25/18 1053      Medications   Scheduled Meds: . feeding supplement (ENSURE ENLIVE)  237 mL Oral TID BM  . hydrocortisone  1 application Topical BID  Continuous Infusions: . sodium chloride 100 mL/hr at 08/24/18 1654  . heparin 500 Units/hr (08/26/18 0313)  . methylPREDNISolone (SOLU-MEDROL) injection 1,000 mg (08/26/18 1136)   PRN Meds:.acetaminophen **OR** acetaminophen, hydrocerin, ondansetron **OR** ondansetron (ZOFRAN) IV   Data Review:   Micro Results No results found for this or any previous visit (from the past 240 hour(s)).  Radiology Reports Ct Angio Head W Or Wo Contrast  Result Date: 08/24/2018 CLINICAL DATA:  Altered mental status EXAM: CT ANGIOGRAPHY HEAD AND NECK TECHNIQUE: Multidetector CT imaging of the head and neck was performed using the standard protocol during bolus administration of intravenous contrast. Multiplanar CT image reconstructions and MIPs were obtained to evaluate the vascular anatomy. Carotid stenosis measurements (when applicable) are obtained utilizing NASCET criteria, using the distal internal carotid diameter as the denominator. CONTRAST:  82mL OMNIPAQUE IOHEXOL 350 MG/ML SOLN COMPARISON:  Brain MRI and head CT from yesterday FINDINGS: CTA NECK FINDINGS Aortic arch: Negative.  Three vessel branching Right carotid system: Vessels are smooth and widely patent. No atheromatous changes. Tortuosity. Left carotid system: Vessels are smooth and widely patent. No atheromatous changes. Tortuosity. Vertebral arteries: No proximal subclavian  stenosis. Both vertebral arteries are smooth and widely patent to the dura. Skeleton: Negative Other neck: Negative Upper chest: Negative Review of the MIP images confirms the above findings CTA HEAD FINDINGS Anterior circulation: No atheromatous changes, beading, aneurysm, or stenosis Posterior circulation: No atheromatous changes, beading, aneurysm, or stenosis Venous sinuses: Patent Anatomic variants: None significant Delayed phase: No abnormal intracranial enhanced Review of the MIP images confirms the above findings IMPRESSION: Negative CTA of the head and neck Electronically Signed   By: Monte Fantasia M.D.   On: 08/24/2018 11:51   Ct Head Wo Contrast  Result Date: 08/23/2018 CLINICAL DATA:  Altered mental status since 8 a.m. today. EXAM: CT HEAD WITHOUT CONTRAST TECHNIQUE: Contiguous axial images were obtained from the base of the skull through the vertex without intravenous contrast. COMPARISON:  12/20/2017. FINDINGS: Brain: Mildly enlarged ventricles and cortical sulci. Minimal patchy white matter low density in both cerebral hemispheres. No intracranial hemorrhage, mass lesion or CT evidence of acute infarction. Vascular: No hyperdense vessel or unexpected calcification. Skull: Normal. Negative for fracture or focal lesion. Sinuses/Orbits: Unremarkable. Other: None. IMPRESSION: 1. No acute abnormality. 2. Minimal atrophy and minimal chronic small vessel white matter ischemic changes in both cerebral hemispheres. Electronically Signed   By: Claudie Revering M.D.   On: 08/23/2018 18:50   Ct Angio Neck W Or Wo Contrast  Result Date: 08/24/2018 CLINICAL DATA:  Altered mental status EXAM: CT ANGIOGRAPHY HEAD AND NECK TECHNIQUE: Multidetector CT imaging of the head and neck was performed using the standard protocol during bolus administration of intravenous contrast. Multiplanar CT image reconstructions and MIPs were obtained to evaluate the vascular anatomy. Carotid stenosis measurements (when applicable)  are obtained utilizing NASCET criteria, using the distal internal carotid diameter as the denominator. CONTRAST:  44mL OMNIPAQUE IOHEXOL 350 MG/ML SOLN COMPARISON:  Brain MRI and head CT from yesterday FINDINGS: CTA NECK FINDINGS Aortic arch: Negative.  Three vessel branching Right carotid system: Vessels are smooth and widely patent. No atheromatous changes. Tortuosity. Left carotid system: Vessels are smooth and widely patent. No atheromatous changes. Tortuosity. Vertebral arteries: No proximal subclavian stenosis. Both vertebral arteries are smooth and widely patent to the dura. Skeleton: Negative Other neck: Negative Upper chest: Negative Review of the MIP images confirms the above findings CTA HEAD FINDINGS Anterior circulation: No atheromatous changes, beading, aneurysm, or  stenosis Posterior circulation: No atheromatous changes, beading, aneurysm, or stenosis Venous sinuses: Patent Anatomic variants: None significant Delayed phase: No abnormal intracranial enhanced Review of the MIP images confirms the above findings IMPRESSION: Negative CTA of the head and neck Electronically Signed   By: Monte Fantasia M.D.   On: 08/24/2018 11:51   Mr Brain Wo Contrast  Result Date: 08/24/2018 CLINICAL DATA:  Altered mental status. History of lupus, hypertension. EXAM: MRI HEAD WITHOUT CONTRAST TECHNIQUE: Multiplanar, multiecho pulse sequences of the brain and surrounding structures were obtained without intravenous contrast. COMPARISON:  CT HEAD August 23, 2018 FINDINGS: Multiple sequences are moderately or severely motion degraded. INTRACRANIAL CONTENTS: No reduced diffusion to suggest acute ischemia or hyperacute demyelination. No lobar hematoma, though blood sensitive sequence is moderately motion degraded limiting sensitivity for microhemorrhage. Moderate global parenchymal brain volume loss. No hydrocephalus. No hydrocephalus. No suspicious parenchymal signal, masses, mass effect. No abnormal extra-axial fluid  collections. No extra-axial masses. VASCULAR: Normal major intracranial vascular flow voids present at skull base. SKULL AND UPPER CERVICAL SPINE: No abnormal sellar expansion. No suspicious calvarial bone marrow signal. Craniocervical junction maintained. SINUSES/ORBITS: The mastoid air-cells and included paranasal sinuses are well-aerated.The included ocular globes and orbital contents are non-suspicious. OTHER: None. IMPRESSION: 1. No acute intracranial process on this motion degraded examination. 2. Moderate parenchymal brain volume loss, advanced for age. Electronically Signed   By: Elon Alas M.D.   On: 08/24/2018 00:42   US Venous Img Lower Unilateral Right  Result Date: 08/19/2018 CLINICAL DATA:  History of right lower extremity DVT, now with worsening right lower extremity pain and edema. Evaluate for acute or chronic DVT EXAM: RIGHT LOWER EXTREMITY VENOUS DOPPLER ULTRASOUND TECHNIQUE: Gray-scale sonography with graded compression, as well as color Doppler and duplex ultrasound were performed to evaluate the lower extremity deep venous systems from the level of the common femoral vein and including the common femoral, femoral, profunda femoral, popliteal and calf veins including the posterior tibial, peroneal and gastrocnemius veins when visible. The superficial great saphenous vein was also interrogated. Spectral Doppler was utilized to evaluate flow at rest and with distal augmentation maneuvers in the common femoral, femoral and popliteal veins. COMPARISON:  Right lower extremity venous Doppler ultrasound-06/14/2018 FINDINGS: Contralateral Common Femoral Vein: Respiratory phasicity is normal and symmetric with the symptomatic side. No evidence of thrombus. Normal compressibility. Common Femoral Vein: There is grossly unchanged mixed echogenic nonocclusive wall thickening/chronic DVT within the right common femoral vein (image 10), similar to the 05/2018 examination. Saphenofemoral Junction:  There is echogenic occlusive thrombus involving the right saphenofemoral junction (image 13), grossly unchanged compared to the 05/2018 examination. Profunda Femoral Vein: Appears patent where imaged. Femoral Vein: There is echogenic occlusive thrombus involving the proximal (image 19), mid (image 22) and distal (image 26) aspects of the right femoral vein, similar to the 05/2018 examination. Popliteal Vein: There is hypoechoic near occlusive wall thickening/chronic DVT involving the right popliteal vein (images 29 and 31), unchanged to slightly improved compared to the 05/2018 examination. Calf Veins: There is hypoechoic occlusive thrombus involving both paired right posterior tibial as well as the right peroneal veins, grossly unchanged compared to the 05/2018 examination. Superficial Great Saphenous Vein: No evidence of thrombus. Normal compressibility. Venous Reflux:  None. Other Findings:  None. IMPRESSION: Grossly unchanged extensive predominantly occlusive DVT extending from the right common femoral vein through the right tibial veins, similar to the 05/2018 examination. Electronically Signed   By: Sandi Mariscal M.D.   On: 08/19/2018 12:30  CBC Recent Labs  Lab 08/23/18 1821 08/25/18 1133 08/26/18 0222  WBC 10.1 10.1 17.0*  HGB 14.4 13.2 12.1  HCT 44.4 39.5 36.8  PLT 161 140* 125*  MCV 96.7 95.4 97.4  MCH 31.4 31.9 32.0  MCHC 32.4 33.4 32.9  RDW 12.0 12.0 12.2  LYMPHSABS 1.0  --   --   MONOABS 0.7  --   --   EOSABS 0.0  --   --   BASOSABS 0.0  --   --     Chemistries  Recent Labs  Lab 08/23/18 1936  NA 138  K 4.0  CL 102  CO2 27  GLUCOSE 93  BUN 21*  CREATININE 0.86  CALCIUM 9.2  AST 19  ALT 12  ALKPHOS 55  BILITOT 0.3   ------------------------------------------------------------------------------------------------------------------ estimated creatinine clearance is 54.8 mL/min (by C-G formula based on SCr of 0.86  mg/dL). ------------------------------------------------------------------------------------------------------------------ No results for input(s): HGBA1C in the last 72 hours. ------------------------------------------------------------------------------------------------------------------ No results for input(s): CHOL, HDL, LDLCALC, TRIG, CHOLHDL, LDLDIRECT in the last 72 hours. ------------------------------------------------------------------------------------------------------------------ Recent Labs    08/23/18 2107  TSH 1.562   ------------------------------------------------------------------------------------------------------------------ No results for input(s): VITAMINB12, FOLATE, FERRITIN, TIBC, IRON, RETICCTPCT in the last 72 hours.  Coagulation profile Recent Labs  Lab 08/25/18 1133  INR 1.3*    No results for input(s): DDIMER in the last 72 hours.  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 #4 essential hypertension blood pressure currently stable continue to monitor #5 hyperlipidemia medications on hold      Code Status Orders  (From admission, onward)         Start     Ordered   08/24/18 0527  Full code  Continuous     08/24/18 0526        Code Status History    Date Active Date Inactive Code Status Order ID Comments User Context   03/30/2017 1122 03/31/2017 1647 Full Code [id number]  Henreitta Leber, MD Inpatient           Consults neurology  DVT  Prophylaxis Heparin  Lab Results  Component Value Date   PLT 125 (L) 08/26/2018     Time Spent in minutes 35 minutes  Greater than 50% of time spent in care coordination and counseling patient regarding the condition and plan of care.   Dustin Flock M.D on 08/26/2018 at 1:51 PM  Between 7am to 6pm - Pager - [phone]  After 6pm go to [email or web address] - Proofreader  [employer]   Office  [phone]

## 2018-08-26 NOTE — Progress Notes (Signed)
[place] for heparin Indication: DVT  Allergies  Allergen Reactions  . Ace Inhibitors     Angioedema of face    Patient Measurements: Height: 5\' 3"  (160 cm) Weight: 100 lb (45.4 kg) IBW/kg (Calculated) : 52.4 Heparin Dosing Weight: 45.4 kg  Vital Signs: Temp: 97.2 F (36.2 C) (03/08 2319) Temp Source: Axillary (03/08 2319) BP: 147/95 (03/08 2319) Pulse Rate: 74 (03/08 2319)  Labs: Recent Labs    08/23/18 1821 08/23/18 1936  08/25/18 1133 08/25/18 1759 08/26/18 0222 08/26/18 0933  HGB 14.4  --   --  13.2  --  12.1  --   HCT 44.4  --   --  39.5  --  36.8  --   PLT 161  --   --  140*  --  125*  --   APTT  --   --   --  30  --   --   --   LABPROT  --   --   --  16.2*  --   --   --   INR  --   --   --  1.3*  --   --   --   HEPARINUNFRC  --   --    < > <0.10* 1.07* 0.71* 0.62  CREATININE  --  0.86  --   --   --   --   --   TROPONINI  --  <0.03  --   --   --   --   --    < > = values in this interval not displayed.    Estimated Creatinine Clearance: 54.8 mL/min (by C-G formula based on SCr of 0.86 mg/dL).   Medical History: Past Medical History:  Diagnosis Date  . Anxiety   . Collagen vascular disease ([place])   . DVT (deep venous thrombosis) ([place])   . Hypertension   . Lupus (systemic lupus erythematosus) ([place])   . Spinal cord lesion [facility])     Assessment: 53 year old female on Xarelto 20 mg daily PTA. Last dose reported 3/5 at 0800. Pharmacy consulted for heparin dosing for VTE treatment.Heparin 2500 unit bolus followed by heparin drip at 700 units/hr. Baseline heparin < 0.10.   3/8 @1759  HL = 1.07 - Infusion rate decreased to 550 units/hr 3/9 @ 0222 HL = 0.71 3/9 @ 0933 HL = 0.62  Goal of Therapy:  Heparin level 0.3-0.7 units/ml Monitor platelets by anticoagulation protocol: Yes   Plan:  3/9 @ 0933 heparin level 0.62. Level is currently therapeutic. Will continue at the rate of 500 units/hr. Will order heparin level  in 6 hours. CBC with morning labs.  Pearla Dubonnet, PharmD  08/26/2018 10:28 AM

## 2018-08-26 NOTE — Progress Notes (Signed)
[place] for heparin Indication: DVT  Allergies  Allergen Reactions  . Ace Inhibitors     Angioedema of face    Patient Measurements: Height: 5\' 3"  (160 cm) Weight: 100 lb (45.4 kg) IBW/kg (Calculated) : 52.4 Heparin Dosing Weight: 45.4 kg  Vital Signs: Temp: 97.2 F (36.2 C) (03/08 2319) Temp Source: Axillary (03/08 2319) BP: 147/95 (03/08 2319) Pulse Rate: 74 (03/08 2319)  Labs: Recent Labs    08/23/18 1821 08/23/18 1936 08/25/18 1133 08/25/18 1759 08/26/18 0222  HGB 14.4  --  13.2  --  12.1  HCT 44.4  --  39.5  --  36.8  PLT 161  --  140*  --  125*  APTT  --   --  30  --   --   LABPROT  --   --  16.2*  --   --   INR  --   --  1.3*  --   --   HEPARINUNFRC  --   --  <0.10* 1.07* 0.71*  CREATININE  --  0.86  --   --   --   TROPONINI  --  <0.03  --   --   --     Estimated Creatinine Clearance: 54.8 mL/min (by C-G formula based on SCr of 0.86 mg/dL).   Medical History: Past Medical History:  Diagnosis Date  . Anxiety   . Collagen vascular disease ([place])   . DVT (deep venous thrombosis) ([place])   . Hypertension   . Lupus (systemic lupus erythematosus) ([place])   . Spinal cord lesion [facility])     Assessment: 53 year old female on Xarelto 20 mg daily PTA. Last dose reported 3/5 at 0800. Pharmacy consulted for heparin dosing for VTE treatment.Heparin 2500 unit bolus followed by heparin drip at 700 units/hr. Baseline heparin < 0.10.   3/8 @1759  HL = 1.07 - Infusion rate decreased to 550 units/hr 3/9 @ 0222 HL = 0.71  Goal of Therapy:  Heparin level 0.3-0.7 units/ml Monitor platelets by anticoagulation protocol: Yes   Plan:  3/9 @ 0222 heparin level 0.71. Level is slightly supratherapeutic. Will decrease the rate to 500 units/hr. Will order heparin level in 6 hours. CBC with morning labs.  Pernell Dupre, PharmD, BCPS  08/26/2018 2:58 AM

## 2018-08-26 NOTE — Progress Notes (Signed)
Valerie Bell at [facility], is a 53 y.o. female, DOB - Sep 29, 1965, OQH:[id number]  Admit date - 08/23/2018   Admitting Physician Harrie Foreman, MD  Outpatient Primary MD for the patient is [place], [facility]   LOS - 0  Subjective: Patient speaking some but not completely back to baseline   Review of Systems:   CONSTITUTIONAL: Unable to communicate some   Vitals:   Vitals:   08/24/18 2332 08/25/18 0800 08/25/18 1558 08/25/18 2319  BP: 101/71 109/79 (!) 122/93 (!) 147/95  Pulse: 85 85 77 74  Resp: 18 18  18   Temp:  98 F (36.7 C) 99.1 F (37.3 C) (!) 97.2 F (36.2 C)  TempSrc: Axillary Axillary Oral Axillary  SpO2: 99% 100% 100% 99%  Weight:      Height:        Wt Readings from Last 3 Encounters:  08/23/18 45.4 kg  08/19/18 45.4 kg  06/14/18 41.3 kg     Intake/Output Summary (Last 24 hours) at 08/26/2018 1401 Last data filed at 08/25/2018 1700 Gross per 24 hour  Intake 900 ml  Output -  Net 900 ml    Physical Exam:   GENERAL: Pleasant-appearing in no apparent distress.  HEAD, EYES, EARS, NOSE AND THROAT: Atraumatic, normocephalic. Extraocular muscles are intact. Pupils equal and reactive to light. Sclerae anicteric. No conjunctival injection. No oro-pharyngeal erythema.  NECK: Supple. There is no jugular venous distention. No bruits, no lymphadenopathy, no thyromegaly.  HEART: Regular rate and rhythm,. No murmurs, no rubs, no clicks.  LUNGS: Clear to auscultation bilaterally. No rales or rhonchi. No wheezes.  ABDOMEN: Soft, flat, nontender, nondistended. Has good bowel sounds. No hepatosplenomegaly appreciated.  EXTREMITIES: No evidence of any cyanosis, clubbing, or peripheral edema.  +2 pedal and radial pulses  bilaterally.  NEUROLOGIC: Unable to communicate SKIN: Moist and warm with no rashes appreciated.  Psych: Not anxious, depressed LN: No inguinal LN enlargement    Antibiotics   Anti-infectives (From admission, onward)   Start     Dose/Rate Route Frequency Ordered Stop   08/24/18 1000  hydroxychloroquine (PLAQUENIL) tablet 100-200 mg  Status:  Discontinued     100-200 mg Oral 2 times daily 08/24/18 0526 08/25/18 1053      Medications   Scheduled Meds: . feeding supplement (ENSURE ENLIVE)  237 mL Oral TID BM  . hydrocortisone  1 application Topical BID   Continuous Infusions: .  sodium chloride 100 mL/hr at 08/24/18 1654  . heparin 500 Units/hr (08/26/18 0313)  . methylPREDNISolone (SOLU-MEDROL) injection 1,000 mg (08/26/18 1136)   PRN Meds:.acetaminophen **OR** acetaminophen, hydrocerin, ondansetron **OR** ondansetron (ZOFRAN) IV   Data Review:   Micro Results No results found for this or any previous visit (from the past 240 hour(s)).  Radiology Reports Ct Angio Head W Or Wo Contrast  Result Date: 08/24/2018 CLINICAL DATA:  Altered mental status EXAM: CT ANGIOGRAPHY HEAD AND NECK TECHNIQUE: Multidetector CT imaging of the head and neck was performed using the standard protocol during bolus administration of intravenous contrast. Multiplanar CT image reconstructions and MIPs were obtained to evaluate the vascular anatomy. Carotid stenosis measurements (when applicable) are obtained utilizing NASCET criteria, using the distal internal carotid diameter as the denominator. CONTRAST:  32mL OMNIPAQUE IOHEXOL 350 MG/ML SOLN COMPARISON:  Brain MRI and head CT from yesterday FINDINGS: CTA NECK FINDINGS Aortic arch: Negative.  Three vessel branching Right carotid system: Vessels are smooth and widely patent. No atheromatous changes. Tortuosity. Left carotid system: Vessels are smooth and widely patent. No atheromatous changes. Tortuosity. Vertebral arteries: No proximal subclavian stenosis.  Both vertebral arteries are smooth and widely patent to the dura. Skeleton: Negative Other neck: Negative Upper chest: Negative Review of the MIP images confirms the above findings CTA HEAD FINDINGS Anterior circulation: No atheromatous changes, beading, aneurysm, or stenosis Posterior circulation: No atheromatous changes, beading, aneurysm, or stenosis Venous sinuses: Patent Anatomic variants: None significant Delayed phase: No abnormal intracranial enhanced Review of the MIP images confirms the above findings IMPRESSION: Negative CTA of the head and neck Electronically Signed   By: Monte Fantasia M.D.   On: 08/24/2018 11:51   Ct Head Wo Contrast  Result Date: 08/23/2018 CLINICAL DATA:  Altered mental status since 8 a.m. today. EXAM: CT HEAD WITHOUT CONTRAST TECHNIQUE: Contiguous axial images were obtained from the base of the skull through the vertex without intravenous contrast. COMPARISON:  12/20/2017. FINDINGS: Brain: Mildly enlarged ventricles and cortical sulci. Minimal patchy white matter low density in both cerebral hemispheres. No intracranial hemorrhage, mass lesion or CT evidence of acute infarction. Vascular: No hyperdense vessel or unexpected calcification. Skull: Normal. Negative for fracture or focal lesion. Sinuses/Orbits: Unremarkable. Other: None. IMPRESSION: 1. No acute abnormality. 2. Minimal atrophy and minimal chronic small vessel white matter ischemic changes in both cerebral hemispheres. Electronically Signed   By: Claudie Revering M.D.   On: 08/23/2018 18:50   Ct Angio Neck W Or Wo Contrast  Result Date: 08/24/2018 CLINICAL DATA:  Altered mental status EXAM: CT ANGIOGRAPHY HEAD AND NECK TECHNIQUE: Multidetector CT imaging of the head and neck was performed using the standard protocol during bolus administration of intravenous contrast. Multiplanar CT image reconstructions and MIPs were obtained to evaluate the vascular anatomy. Carotid stenosis measurements (when applicable) are obtained  utilizing NASCET criteria, using the distal internal carotid diameter as the denominator. CONTRAST:  75mL OMNIPAQUE IOHEXOL 350 MG/ML SOLN COMPARISON:  Brain MRI and head CT from yesterday FINDINGS: CTA NECK FINDINGS Aortic arch: Negative.  Three vessel branching Right carotid system: Vessels are smooth and widely patent. No atheromatous changes. Tortuosity. Left carotid system: Vessels are smooth and widely patent. No atheromatous changes. Tortuosity. Vertebral arteries: No proximal subclavian stenosis. Both vertebral arteries are smooth and widely patent to the dura. Skeleton: Negative Other neck: Negative Upper chest: Negative Review of the MIP images confirms the above findings CTA HEAD FINDINGS Anterior circulation: No atheromatous changes, beading, aneurysm, or stenosis Posterior circulation:  No atheromatous changes, beading, aneurysm, or stenosis Venous sinuses: Patent Anatomic variants: None significant Delayed phase: No abnormal intracranial enhanced Review of the MIP images confirms the above findings IMPRESSION: Negative CTA of the head and neck Electronically Signed   By: Monte Fantasia M.D.   On: 08/24/2018 11:51   Mr Brain Wo Contrast  Result Date: 08/24/2018 CLINICAL DATA:  Altered mental status. History of lupus, hypertension. EXAM: MRI HEAD WITHOUT CONTRAST TECHNIQUE: Multiplanar, multiecho pulse sequences of the brain and surrounding structures were obtained without intravenous contrast. COMPARISON:  CT HEAD August 23, 2018 FINDINGS: Multiple sequences are moderately or severely motion degraded. INTRACRANIAL CONTENTS: No reduced diffusion to suggest acute ischemia or hyperacute demyelination. No lobar hematoma, though blood sensitive sequence is moderately motion degraded limiting sensitivity for microhemorrhage. Moderate global parenchymal brain volume loss. No hydrocephalus. No hydrocephalus. No suspicious parenchymal signal, masses, mass effect. No abnormal extra-axial fluid collections. No  extra-axial masses. VASCULAR: Normal major intracranial vascular flow voids present at skull base. SKULL AND UPPER CERVICAL SPINE: No abnormal sellar expansion. No suspicious calvarial bone marrow signal. Craniocervical junction maintained. SINUSES/ORBITS: The mastoid air-cells and included paranasal sinuses are well-aerated.The included ocular globes and orbital contents are non-suspicious. OTHER: None. IMPRESSION: 1. No acute intracranial process on this motion degraded examination. 2. Moderate parenchymal brain volume loss, advanced for age. Electronically Signed   By: Elon Alas M.D.   On: 08/24/2018 00:42   US Venous Img Lower Unilateral Right  Result Date: 08/19/2018 CLINICAL DATA:  History of right lower extremity DVT, now with worsening right lower extremity pain and edema. Evaluate for acute or chronic DVT EXAM: RIGHT LOWER EXTREMITY VENOUS DOPPLER ULTRASOUND TECHNIQUE: Gray-scale sonography with graded compression, as well as color Doppler and duplex ultrasound were performed to evaluate the lower extremity deep venous systems from the level of the common femoral vein and including the common femoral, femoral, profunda femoral, popliteal and calf veins including the posterior tibial, peroneal and gastrocnemius veins when visible. The superficial great saphenous vein was also interrogated. Spectral Doppler was utilized to evaluate flow at rest and with distal augmentation maneuvers in the common femoral, femoral and popliteal veins. COMPARISON:  Right lower extremity venous Doppler ultrasound-06/14/2018 FINDINGS: Contralateral Common Femoral Vein: Respiratory phasicity is normal and symmetric with the symptomatic side. No evidence of thrombus. Normal compressibility. Common Femoral Vein: There is grossly unchanged mixed echogenic nonocclusive wall thickening/chronic DVT within the right common femoral vein (image 10), similar to the 05/2018 examination. Saphenofemoral Junction: There is echogenic  occlusive thrombus involving the right saphenofemoral junction (image 13), grossly unchanged compared to the 05/2018 examination. Profunda Femoral Vein: Appears patent where imaged. Femoral Vein: There is echogenic occlusive thrombus involving the proximal (image 19), mid (image 22) and distal (image 26) aspects of the right femoral vein, similar to the 05/2018 examination. Popliteal Vein: There is hypoechoic near occlusive wall thickening/chronic DVT involving the right popliteal vein (images 29 and 31), unchanged to slightly improved compared to the 05/2018 examination. Calf Veins: There is hypoechoic occlusive thrombus involving both paired right posterior tibial as well as the right peroneal veins, grossly unchanged compared to the 05/2018 examination. Superficial Great Saphenous Vein: No evidence of thrombus. Normal compressibility. Venous Reflux:  None. Other Findings:  None. IMPRESSION: Grossly unchanged extensive predominantly occlusive DVT extending from the right common femoral vein through the right tibial veins, similar to the 05/2018 examination. Electronically Signed   By: Sandi Mariscal M.D.   On: 08/19/2018 12:30  CBC Recent Labs  Lab 08/23/18 1821 08/25/18 1133 08/26/18 0222  WBC 10.1 10.1 17.0*  HGB 14.4 13.2 12.1  HCT 44.4 39.5 36.8  PLT 161 140* 125*  MCV 96.7 95.4 97.4  MCH 31.4 31.9 32.0  MCHC 32.4 33.4 32.9  RDW 12.0 12.0 12.2  LYMPHSABS 1.0  --   --   MONOABS 0.7  --   --   EOSABS 0.0  --   --   BASOSABS 0.0  --   --     Chemistries  Recent Labs  Lab 08/23/18 1936  NA 138  K 4.0  CL 102  CO2 27  GLUCOSE 93  BUN 21*  CREATININE 0.86  CALCIUM 9.2  AST 19  ALT 12  ALKPHOS 55  BILITOT 0.3   ------------------------------------------------------------------------------------------------------------------ estimated creatinine clearance is 54.8 mL/min (by C-G formula based on SCr of 0.86  mg/dL). ------------------------------------------------------------------------------------------------------------------ No results for input(s): HGBA1C in the last 72 hours. ------------------------------------------------------------------------------------------------------------------ No results for input(s): CHOL, HDL, LDLCALC, TRIG, CHOLHDL, LDLDIRECT in the last 72 hours. ------------------------------------------------------------------------------------------------------------------ Recent Labs    08/23/18 2107  TSH 1.562   ------------------------------------------------------------------------------------------------------------------ No results for input(s): VITAMINB12, FOLATE, FERRITIN, TIBC, IRON, RETICCTPCT in the last 72 hours.  Coagulation profile Recent Labs  Lab 08/25/18 1133  INR 1.3*    No results for input(s): DDIMER in the last 72 hours.  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 heparin #4 essential hypertension blood pressure currently stable continue to monitor #5 hyperlipidemia medications on hold      Code Status Orders  (From admission, onward)         Start     Ordered   08/24/18 0527  Full code  Continuous     08/24/18 0526        Code Status History    Date Active Date Inactive Code Status Order ID Comments User Context   03/30/2017 1122 03/31/2017 1647 Full Code [id number]  Henreitta Leber, MD Inpatient           Consults neurology  DVT  Prophylaxis Heparin  Lab Results  Component Value Date   PLT 125 (L) 08/26/2018     Time Spent in minutes 35 minutes  Greater than 50% of time spent in care coordination and counseling patient regarding the condition and plan of care.   Dustin Flock M.D on 08/26/2018 at 2:01 PM  Between 7am to 6pm - Pager - [phone]  After 6pm go to [email or web address] - Proofreader  [employer]   Office  [phone]

## 2018-08-26 NOTE — Progress Notes (Signed)
Subjective: Patient's roommate is in the room and reports that the patient is much better today.  She reports that she has been talking a lot.  Feels she is 80% back to baseline.    Objective: Current vital signs: BP (!) 147/95 (BP Location: Left Arm)   Pulse 74   Temp (!) 97.2 F (36.2 C) (Axillary)   Resp 18   Ht 5\' 3"  (1.6 m)   Wt 45.4 kg   LMP 12/29/2001   SpO2 99%   BMI 17.71 kg/m  Vital signs in last 24 hours: Temp:  [97.2 F (36.2 C)-99.1 F (37.3 C)] 97.2 F (36.2 C) (03/08 2319) Pulse Rate:  [74-77] 74 (03/08 2319) Resp:  [18] 18 (03/08 2319) BP: (122-147)/(93-95) 147/95 (03/08 2319) SpO2:  [99 %-100 %] 99 % (03/08 2319)  Intake/Output from previous day: 03/08 0701 - 03/09 0700 In: 2240 [I.V.:2240] Out: -  Intake/Output this shift: No intake/output data recorded. Nutritional status:  Diet Order            Diet NPO time specified  Diet effective now              Neurologic Exam: Mental Status: Alert.  Says "yes" to all questions.  When asked the year she is unable to tell me the year and unable to tell me where she is.  Does not follow commands well even with non-verbal cues.   Cranial Nerves: II: Blinks to bilateral confrontation, pupils equal, round, reactive to light and accommodation III,IV, VI: ptosis not present, extra-ocular motions intact bilaterally V,VII: smile symmetric, facial light touch sensation normal bilaterally VIII: hearing normal bilaterally IX,X: gag reflex present XI: bilateral shoulder shrug XII: midline tongue extension Motor: Lifts both arms off the bed and able to maintain.  Flexes the legs but does not lift all the bed  Lab Results: Basic Metabolic Panel: Recent Labs  Lab 08/23/18 1936  NA 138  K 4.0  CL 102  CO2 27  GLUCOSE 93  BUN 21*  CREATININE 0.86  CALCIUM 9.2    Liver Function Tests: Recent Labs  Lab 08/23/18 1936  AST 19  ALT 12  ALKPHOS 55  BILITOT 0.3  PROT 7.9  ALBUMIN 4.0   No results for  input(s): LIPASE, AMYLASE in the last 168 hours. Recent Labs  Lab 08/23/18 2107  AMMONIA <9*    CBC: Recent Labs  Lab 08/23/18 1821 08/25/18 1133 08/26/18 0222  WBC 10.1 10.1 17.0*  NEUTROABS 8.4*  --   --   HGB 14.4 13.2 12.1  HCT 44.4 39.5 36.8  MCV 96.7 95.4 97.4  PLT 161 140* 125*    Cardiac Enzymes: Recent Labs  Lab 08/23/18 1936  TROPONINI <0.03    Lipid Panel: No results for input(s): CHOL, TRIG, HDL, CHOLHDL, VLDL, LDLCALC in the last 168 hours.  CBG: Recent Labs  Lab 08/23/18 1800  GLUCAP 79    Microbiology: No results found for this or any previous visit.  Coagulation Studies: Recent Labs    08/25/18 1133  LABPROT 16.2*  INR 1.3*    Imaging: No results found.  Medications:  I have reviewed the patient's current medications. Scheduled: . feeding supplement (ENSURE ENLIVE)  237 mL Oral TID BM  . hydrocortisone  1 application Topical BID    Assessment/Plan: Patient improved but not back to baseline.  Today is day#3 of Solumedrol.  Appears to be tolerating well.   Recommendations: 1.  Continue Solumedrol for 5 days 2.  GI prophylaxis  LOS: 0 days   Alexis Goodell, MD Neurology [phone] 08/26/2018  2:14 PM

## 2018-08-26 NOTE — Progress Notes (Signed)
[place] for heparin Indication: DVT  Allergies  Allergen Reactions  . Ace Inhibitors     Angioedema of face    Patient Measurements: Height: 5\' 3"  (160 cm) Weight: 100 lb (45.4 kg) IBW/kg (Calculated) : 52.4 Heparin Dosing Weight: 45.4 kg  Vital Signs: Temp: 98.2 F (36.8 C) (03/09 1558) Temp Source: Axillary (03/09 1558) BP: 147/95 (03/09 1558) Pulse Rate: 80 (03/09 1558)  Labs: Recent Labs    08/23/18 1821 08/23/18 1936 08/25/18 1133  08/26/18 0222 08/26/18 0933 08/26/18 1531  HGB 14.4  --  13.2  --  12.1  --   --   HCT 44.4  --  39.5  --  36.8  --   --   PLT 161  --  140*  --  125*  --   --   APTT  --   --  30  --   --   --   --   LABPROT  --   --  16.2*  --   --   --   --   INR  --   --  1.3*  --   --   --   --   HEPARINUNFRC  --   --  <0.10*   < > 0.71* 0.62 0.58  CREATININE  --  0.86  --   --   --   --   --   TROPONINI  --  <0.03  --   --   --   --   --    < > = values in this interval not displayed.    Estimated Creatinine Clearance: 54.8 mL/min (by C-G formula based on SCr of 0.86 mg/dL).   Medical History: Past Medical History:  Diagnosis Date  . Anxiety   . Collagen vascular disease ([place])   . DVT (deep venous thrombosis) ([place])   . Hypertension   . Lupus (systemic lupus erythematosus) ([place])   . 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 Last dose reported 3/5 at 0800. Pharmacy consulted for heparin dosing for VTE treatment.Heparin 2500 unit bolus followed by heparin drip at 700 units/hr. Baseline heparin < 0.10.   3/8 @1759  HL = 1.07 - Infusion rate decreased to 550 units/hr 3/9 @ 0222 HL = 0.71 3/9 @ 0933 HL = 0.62  Goal of Therapy:  Heparin level 0.3-0.7 units/ml Monitor platelets by anticoagulation protocol: Yes   Plan:  3/9 @ 15:31 heparin level 0.58.  Level is currently therapeutic. Will continue at the rate of 500 units/hr. Will order heparin level  and CBC with morning labs.  Olivia Canter, [facility] 08/26/2018 5:33 PM

## 2018-08-27 ENCOUNTER — Inpatient Hospital Stay (HOSPITAL_COMMUNITY)
Admission: AD | Admit: 2018-08-27 | Discharge: 2018-09-03 | DRG: 097 | Disposition: A | Discharge status: Other Acute Inpatient Hospital | Payer: Medicare Other | Source: Other Acute Inpatient Hospital | Attending: Family Medicine | Admitting: Family Medicine

## 2018-08-27 ENCOUNTER — Other Ambulatory Visit: Payer: Medicare Other

## 2018-08-27 ENCOUNTER — Inpatient Hospital Stay (HOSPITAL_COMMUNITY): Payer: Medicare Other

## 2018-08-27 DIAGNOSIS — I639 Cerebral infarction, unspecified: Secondary | ICD-10-CM | POA: Diagnosis not present

## 2018-08-27 DIAGNOSIS — Z7901 Long term (current) use of anticoagulants: Secondary | ICD-10-CM

## 2018-08-27 DIAGNOSIS — D696 Thrombocytopenia, unspecified: Secondary | ICD-10-CM | POA: Diagnosis not present

## 2018-08-27 DIAGNOSIS — R4182 Altered mental status, unspecified: Secondary | ICD-10-CM

## 2018-08-27 DIAGNOSIS — Z86718 Personal history of other venous thrombosis and embolism: Secondary | ICD-10-CM | POA: Diagnosis not present

## 2018-08-27 DIAGNOSIS — M3219 Other organ or system involvement in systemic lupus erythematosus: Secondary | ICD-10-CM | POA: Diagnosis present

## 2018-08-27 DIAGNOSIS — Z87891 Personal history of nicotine dependence: Secondary | ICD-10-CM

## 2018-08-27 DIAGNOSIS — G9341 Metabolic encephalopathy: Secondary | ICD-10-CM | POA: Diagnosis present

## 2018-08-27 DIAGNOSIS — D72829 Elevated white blood cell count, unspecified: Secondary | ICD-10-CM | POA: Diagnosis present

## 2018-08-27 DIAGNOSIS — M329 Systemic lupus erythematosus, unspecified: Secondary | ICD-10-CM | POA: Diagnosis present

## 2018-08-27 DIAGNOSIS — Z8249 Family history of ischemic heart disease and other diseases of the circulatory system: Secondary | ICD-10-CM

## 2018-08-27 DIAGNOSIS — G0490 Encephalitis and encephalomyelitis, unspecified: Secondary | ICD-10-CM | POA: Diagnosis present

## 2018-08-27 DIAGNOSIS — I10 Essential (primary) hypertension: Secondary | ICD-10-CM | POA: Diagnosis present

## 2018-08-27 DIAGNOSIS — G9340 Encephalopathy, unspecified: Secondary | ICD-10-CM | POA: Diagnosis present

## 2018-08-27 DIAGNOSIS — Z79899 Other long term (current) drug therapy: Secondary | ICD-10-CM | POA: Diagnosis not present

## 2018-08-27 DIAGNOSIS — Z90710 Acquired absence of both cervix and uterus: Secondary | ICD-10-CM | POA: Diagnosis not present

## 2018-08-27 DIAGNOSIS — A86 Unspecified viral encephalitis: Principal | ICD-10-CM | POA: Diagnosis present

## 2018-08-27 DIAGNOSIS — E785 Hyperlipidemia, unspecified: Secondary | ICD-10-CM | POA: Diagnosis present

## 2018-08-27 DIAGNOSIS — G8191 Hemiplegia, unspecified affecting right dominant side: Secondary | ICD-10-CM | POA: Diagnosis present

## 2018-08-27 DIAGNOSIS — R1310 Dysphagia, unspecified: Secondary | ICD-10-CM | POA: Diagnosis not present

## 2018-08-27 DIAGNOSIS — O2230 Deep phlebothrombosis in pregnancy, unspecified trimester: Secondary | ICD-10-CM | POA: Diagnosis present

## 2018-08-27 DIAGNOSIS — I82531 Chronic embolism and thrombosis of right popliteal vein: Secondary | ICD-10-CM | POA: Diagnosis present

## 2018-08-27 DIAGNOSIS — D472 Monoclonal gammopathy: Secondary | ICD-10-CM | POA: Diagnosis present

## 2018-08-27 DIAGNOSIS — L8990 Pressure ulcer of unspecified site, unspecified stage: Secondary | ICD-10-CM

## 2018-08-27 LAB — ANA COMPREHENSIVE PANEL

## 2018-08-27 LAB — CBC WITH DIFFERENTIAL/PLATELET
Abs Immature Granulocytes: 0.08 10*3/uL — ABNORMAL HIGH (ref 0.00–0.07)
Basophils Absolute: 0 10*3/uL (ref 0.0–0.1)
Basophils Relative: 0 %
Eosinophils Absolute: 0 10*3/uL (ref 0.0–0.5)
Eosinophils Relative: 0 %
HCT: 36.7 % (ref 36.0–46.0)
Hemoglobin: 11.7 g/dL — ABNORMAL LOW (ref 12.0–15.0)
Immature Granulocytes: 1 %
Lymphocytes Relative: 5 %
Lymphs Abs: 0.7 10*3/uL (ref 0.7–4.0)
MCH: 31.2 pg (ref 26.0–34.0)
MCHC: 31.9 g/dL (ref 30.0–36.0)
MCV: 97.9 fL (ref 80.0–100.0)
Monocytes Absolute: 0.7 10*3/uL (ref 0.1–1.0)
Monocytes Relative: 5 %
Neutro Abs: 13 10*3/uL — ABNORMAL HIGH (ref 1.7–7.7)
Neutrophils Relative %: 89 %
Platelets: 123 10*3/uL — ABNORMAL LOW (ref 150–400)
RBC: 3.75 MIL/uL — ABNORMAL LOW (ref 3.87–5.11)
RDW: 12.3 % (ref 11.5–15.5)
WBC: 14.5 10*3/uL — ABNORMAL HIGH (ref 4.0–10.5)
nRBC: 0 % (ref 0.0–0.2)

## 2018-08-27 LAB — HIV ANTIBODY (ROUTINE TESTING W REFLEX): HIV Screen 4th Generation wRfx: NONREACTIVE

## 2018-08-27 LAB — HEPARIN LEVEL (UNFRACTIONATED): Heparin Unfractionated: 0.4 IU/mL (ref 0.30–0.70)

## 2018-08-27 LAB — RPR: RPR Ser Ql: NONREACTIVE

## 2018-08-27 MED ORDER — LORAZEPAM 2 MG/ML IJ SOLN
INTRAMUSCULAR | Status: AC
  Filled 2018-08-27: qty 1

## 2018-08-27 MED ORDER — GADOBUTROL 1 MMOL/ML IV SOLN
5.0000 mL | Freq: Once | INTRAVENOUS | Status: AC | PRN
  Administered 2018-08-27: 5 mL via INTRAVENOUS

## 2018-08-27 MED ORDER — HYDROCODONE-ACETAMINOPHEN 5-325 MG PO TABS: 1.0000 | ORAL_TABLET | ORAL | Status: DC | PRN

## 2018-08-27 MED ORDER — ATORVASTATIN CALCIUM 80 MG PO TABS: 80.0000 mg | ORAL_TABLET | Freq: Every day | ORAL | Status: DC

## 2018-08-27 MED ORDER — SODIUM CHLORIDE 0.9 % IV SOLN
1000.0000 mg | Freq: Every day | INTRAVENOUS | Status: AC
  Administered 2018-08-28: 1000 mg via INTRAVENOUS
  Filled 2018-08-27: qty 8

## 2018-08-27 MED ORDER — ONDANSETRON HCL 4 MG PO TABS: 4.0000 mg | ORAL_TABLET | Freq: Four times a day (QID) | ORAL | Status: DC | PRN

## 2018-08-27 MED ORDER — ONDANSETRON HCL 4 MG/2ML IJ SOLN: 4.0000 mg | Freq: Four times a day (QID) | INTRAMUSCULAR | Status: DC | PRN

## 2018-08-27 MED ORDER — LORAZEPAM 2 MG/ML IJ SOLN
1.0000 mg | Freq: Once | INTRAMUSCULAR | Status: AC
  Administered 2018-08-27: 1 mg via INTRAVENOUS

## 2018-08-27 MED ORDER — HYDRALAZINE HCL 20 MG/ML IJ SOLN
10.0000 mg | Freq: Four times a day (QID) | INTRAMUSCULAR | Status: DC | PRN
  Administered 2018-08-27: 10 mg via INTRAVENOUS
  Filled 2018-08-27: qty 1

## 2018-08-27 MED ORDER — HEPARIN (PORCINE) 25000 UT/250ML-% IV SOLN: 700.0000 [IU]/h | INTRAVENOUS | Status: DC

## 2018-08-27 MED ORDER — HEPARIN (PORCINE) 25000 UT/250ML-% IV SOLN
750.0000 [IU]/h | INTRAVENOUS | Status: AC
  Administered 2018-08-27 – 2018-08-28 (×2): 500 [IU]/h via INTRAVENOUS
  Filled 2018-08-27 (×2): qty 250

## 2018-08-27 NOTE — Discharge Summary (Signed)
[name] at [facility], 53 y.o., DOB 1965-07-08, MRN [id number]. Admission date: 08/23/2018 Discharge Date 08/27/2018 Primary MD [facility] Admitting Physician Harrie Foreman, MD  Admission Diagnosis  Altered mental status, unspecified altered mental status type [R41.82]  Discharge Diagnosis   Active Problems: Acute encephalopathy of unclear etiology SLE Chronic DVT Essential hypertension Hyperlipidemia   Hospital Course Patient is a 53 year old with history of lupus, chronic DVT who was seen in the emergency room on March 2 with complaint of right lower extremity swelling and was noted to have a chronically occlusive DVT.  She was continued on Xarelto.  Patient was brought back to the emergency room with altered mental status on the morning of 3/07.  Patient did not have any fevers or chills.  Evaluation in the emergency room including a CT and MRI brain was negative.  Patient was admitted for further evaluation.  She was seen in consultation by neurology.  She was started on steroids for possible vasculitis or cerebritis related to lupus.  Patient has not shown much improvement therefore recommended to be transferred to [facility] for continuous EEG.  Patient may also need a lumbar puncture.  I have discussed the case with the on-call hospitalist and on-call neurologist at [facility].   To note patient has been hospitalized at [facility] for a spinal mass in the past which was felt to be due to inflammatory process.  He also was worked up for possible multiple sclerosis.  She was lost to follow-up.  Patient will need a lumbar puncture and EEG monitoring.  Due to bed constraints patient is not being able to transfer to [facility]  neurology  Significant Tests:  See full reports for all details     Ct Angio Head W Or Wo Contrast  Result Date: 08/24/2018 CLINICAL DATA:  Altered mental status EXAM: CT ANGIOGRAPHY HEAD  AND NECK TECHNIQUE: Multidetector CT imaging of the head and neck was performed using the standard protocol during bolus administration of intravenous contrast. Multiplanar CT image reconstructions and MIPs were obtained to evaluate the vascular anatomy. Carotid stenosis measurements (when applicable) are obtained utilizing NASCET criteria, using the distal internal carotid diameter as the denominator. CONTRAST:  64mL OMNIPAQUE IOHEXOL 350 MG/ML SOLN COMPARISON:  Brain MRI and head CT from yesterday FINDINGS: CTA NECK FINDINGS Aortic arch: Negative.  Three vessel branching Right carotid system: Vessels are smooth and widely patent. No atheromatous changes. Tortuosity. Left carotid system: Vessels are smooth and widely patent. No atheromatous changes. Tortuosity. Vertebral arteries: No proximal subclavian stenosis. Both vertebral arteries are smooth and widely patent to the dura. Skeleton: Negative Other neck: Negative Upper chest: Negative Review of the MIP images confirms the above findings CTA HEAD FINDINGS Anterior circulation: No atheromatous changes, beading, aneurysm, or stenosis Posterior circulation: No atheromatous changes, beading, aneurysm, or stenosis Venous sinuses: Patent Anatomic variants: None significant Delayed phase: No abnormal intracranial enhanced Review of the MIP images confirms the above findings IMPRESSION: Negative CTA of the head and neck Electronically Signed   By: Monte Fantasia M.D.   On: 08/24/2018 11:51   Ct Head Wo Contrast  Result Date: 08/23/2018 CLINICAL DATA:  Altered mental status since 8 a.m. today. EXAM: CT HEAD WITHOUT CONTRAST TECHNIQUE: Contiguous axial images were obtained from the base of the skull through the vertex without intravenous contrast. COMPARISON:  12/20/2017. FINDINGS: Brain: Mildly enlarged ventricles and cortical  sulci. Minimal patchy white matter low density in both cerebral hemispheres. No intracranial hemorrhage, mass lesion or CT evidence of acute  infarction. Vascular: No hyperdense vessel or unexpected calcification. Skull: Normal. Negative for fracture or focal lesion. Sinuses/Orbits: Unremarkable. Other: None. IMPRESSION: 1. No acute abnormality. 2. Minimal atrophy and minimal chronic small vessel white matter ischemic changes in both cerebral hemispheres. Electronically Signed   By: Claudie Revering M.D.   On: 08/23/2018 18:50   Ct Angio Neck W Or Wo Contrast  Result Date: 08/24/2018 CLINICAL DATA:  Altered mental status EXAM: CT ANGIOGRAPHY HEAD AND NECK TECHNIQUE: Multidetector CT imaging of the head and neck was performed using the standard protocol during bolus administration of intravenous contrast. Multiplanar CT image reconstructions and MIPs were obtained to evaluate the vascular anatomy. Carotid stenosis measurements (when applicable) are obtained utilizing NASCET criteria, using the distal internal carotid diameter as the denominator. CONTRAST:  59mL OMNIPAQUE IOHEXOL 350 MG/ML SOLN COMPARISON:  Brain MRI and head CT from yesterday FINDINGS: CTA NECK FINDINGS Aortic arch: Negative.  Three vessel branching Right carotid system: Vessels are smooth and widely patent. No atheromatous changes. Tortuosity. Left carotid system: Vessels are smooth and widely patent. No atheromatous changes. Tortuosity. Vertebral arteries: No proximal subclavian stenosis. Both vertebral arteries are smooth and widely patent to the dura. Skeleton: Negative Other neck: Negative Upper chest: Negative Review of the MIP images confirms the above findings CTA HEAD FINDINGS Anterior circulation: No atheromatous changes, beading, aneurysm, or stenosis Posterior circulation: No atheromatous changes, beading, aneurysm, or stenosis Venous sinuses: Patent Anatomic variants: None significant Delayed phase: No abnormal intracranial enhanced Review of the MIP images confirms the above findings IMPRESSION: Negative CTA of the head and neck Electronically Signed   By: Monte Fantasia M.D.    On: 08/24/2018 11:51   Mr Brain Wo Contrast  Result Date: 08/24/2018 CLINICAL DATA:  Altered mental status. History of lupus, hypertension. EXAM: MRI HEAD WITHOUT CONTRAST TECHNIQUE: Multiplanar, multiecho pulse sequences of the brain and surrounding structures were obtained without intravenous contrast. COMPARISON:  CT HEAD August 23, 2018 FINDINGS: Multiple sequences are moderately or severely motion degraded. INTRACRANIAL CONTENTS: No reduced diffusion to suggest acute ischemia or hyperacute demyelination. No lobar hematoma, though blood sensitive sequence is moderately motion degraded limiting sensitivity for microhemorrhage. Moderate global parenchymal brain volume loss. No hydrocephalus. No hydrocephalus. No suspicious parenchymal signal, masses, mass effect. No abnormal extra-axial fluid collections. No extra-axial masses. VASCULAR: Normal major intracranial vascular flow voids present at skull base. SKULL AND UPPER CERVICAL SPINE: No abnormal sellar expansion. No suspicious calvarial bone marrow signal. Craniocervical junction maintained. SINUSES/ORBITS: The mastoid air-cells and included paranasal sinuses are well-aerated.The included ocular globes and orbital contents are non-suspicious. OTHER: None. IMPRESSION: 1. No acute intracranial process on this motion degraded examination. 2. Moderate parenchymal brain volume loss, advanced for age. Electronically Signed   By: Elon Alas M.D.   On: 08/24/2018 00:42   US Venous Img Lower Unilateral Right  Result Date: 08/19/2018 CLINICAL DATA:  History of right lower extremity DVT, now with worsening right lower extremity pain and edema. Evaluate for acute or chronic DVT EXAM: RIGHT LOWER EXTREMITY VENOUS DOPPLER ULTRASOUND TECHNIQUE: Gray-scale sonography with graded compression, as well as color Doppler and duplex ultrasound were performed to evaluate the lower extremity deep venous systems from the level of the common femoral vein and including the  common femoral, femoral, profunda femoral, popliteal and calf veins including the posterior tibial, peroneal and gastrocnemius veins when visible. The  superficial great saphenous vein was also interrogated. Spectral Doppler was utilized to evaluate flow at rest and with distal augmentation maneuvers in the common femoral, femoral and popliteal veins. COMPARISON:  Right lower extremity venous Doppler ultrasound-06/14/2018 FINDINGS: Contralateral Common Femoral Vein: Respiratory phasicity is normal and symmetric with the symptomatic side. No evidence of thrombus. Normal compressibility. Common Femoral Vein: There is grossly unchanged mixed echogenic nonocclusive wall thickening/chronic DVT within the right common femoral vein (image 10), similar to the 05/2018 examination. Saphenofemoral Junction: There is echogenic occlusive thrombus involving the right saphenofemoral junction (image 13), grossly unchanged compared to the 05/2018 examination. Profunda Femoral Vein: Appears patent where imaged. Femoral Vein: There is echogenic occlusive thrombus involving the proximal (image 19), mid (image 22) and distal (image 26) aspects of the right femoral vein, similar to the 05/2018 examination. Popliteal Vein: There is hypoechoic near occlusive wall thickening/chronic DVT involving the right popliteal vein (images 29 and 31), unchanged to slightly improved compared to the 05/2018 examination. Calf Veins: There is hypoechoic occlusive thrombus involving both paired right posterior tibial as well as the right peroneal veins, grossly unchanged compared to the 05/2018 examination. Superficial Great Saphenous Vein: No evidence of thrombus. Normal compressibility. Venous Reflux:  None. Other Findings:  None. IMPRESSION: Grossly unchanged extensive predominantly occlusive DVT extending from the right common femoral vein through the right tibial veins, similar to the 05/2018 examination. Electronically Signed   By: Sandi Mariscal M.D.    On: 08/19/2018 12:30       Today   Subjective:   Valerie Bell   patient speaking somewhat but still not baseline Objective:   Blood pressure (!) 167/80, pulse (!) 43, temperature 98.6 F (37 C), temperature source Axillary, resp. rate 17, height 5\' 3"  (1.6 m), weight 45.4 kg, last menstrual period 12/29/2001, SpO2 100 %.  .  Intake/Output Summary (Last 24 hours) at 08/27/2018 1308 Last data filed at 08/26/2018 1839 Gross per 24 hour  Intake 420 ml  Output -  Net 420 ml    Exam VITAL SIGNS: Blood pressure (!) 167/80, pulse (!) 43, temperature 98.6 F (37 C), temperature source Axillary, resp. rate 17, height 5\' 3"  (1.6 m), weight 45.4 kg, last menstrual period 12/29/2001, SpO2 100 %.  GENERAL:  53 y.o.-year-old patient lying in the bed with no acute distress.  EYES: Pupils equal, round, reactive to light and accommodation. No scleral icterus. Extraocular muscles intact.  HEENT: Head atraumatic, normocephalic. Oropharynx and nasopharynx clear.  NECK:  Supple, no jugular venous distention. No thyroid enlargement, no tenderness.  LUNGS: Normal breath sounds bilaterally, no wheezing, rales,rhonchi or crepitation. No use of accessory muscles of respiration.  CARDIOVASCULAR: S1, S2 normal. No murmurs, rubs, or gallops.  ABDOMEN: Soft, nontender, nondistended. Bowel sounds present. No organomegaly or mass.  EXTREMITIES: No pedal edema, cyanosis, or clubbing.  NEUROLOGIC: Able to follow commands now PSYCHIATRIC: Alert but not oriented SKIN: No obvious rash, lesion, or ulcer.   Data Review     CBC w Diff:  Lab Results  Component Value Date   WBC 14.5 (H) 08/27/2018   HGB 11.7 (L) 08/27/2018   HGB 13.5 07/13/2013   HCT 36.7 08/27/2018   HCT 40.4 07/13/2013   PLT 123 (L) 08/27/2018   PLT 160 07/13/2013   LYMPHOPCT 5 08/27/2018   LYMPHOPCT 26.3 07/13/2013   MONOPCT 5 08/27/2018   MONOPCT 11.3 07/13/2013   EOSPCT 0 08/27/2018   EOSPCT 1.4 07/13/2013   BASOPCT 0 08/27/2018    BASOPCT 0.3 07/13/2013  CMP:  Lab Results  Component Value Date   NA 138 08/23/2018   NA 136 07/13/2013   K 4.0 08/23/2018   K 3.4 (L) 07/13/2013   CL 102 08/23/2018   CL 107 07/13/2013   CO2 27 08/23/2018   CO2 23 07/13/2013   BUN 21 (H) 08/23/2018   BUN 19 (H) 07/13/2013   CREATININE 0.86 08/23/2018   CREATININE 0.82 07/13/2013   PROT 7.9 08/23/2018   PROT 6.5 07/13/2013   ALBUMIN 4.0 08/23/2018   ALBUMIN 3.1 (L) 07/13/2013   BILITOT 0.3 08/23/2018   BILITOT 0.5 07/13/2013   ALKPHOS 55 08/23/2018   ALKPHOS 34 (L) 07/13/2013   AST 19 08/23/2018   AST 22 07/13/2013   ALT 12 08/23/2018   ALT 10 (L) 07/13/2013  .  Micro Results No results found for this or any previous visit (from the past 240 hour(s)).      Code Status Orders  (From admission, onward)         Start     Ordered   08/24/18 0527  Full code  Continuous     08/24/18 0526        Code Status History    Date Active Date Inactive Code Status Order ID Comments User Context   03/30/2017 1122 03/31/2017 1647 Full Code [id number]  Henreitta Leber, MD Inpatient            Discharge Medications   Allergies as of 08/27/2018      Reactions   Ace Inhibitors    Angioedema of face      Medication List    STOP taking these medications   atorvastatin 80 MG tablet Commonly known as:  Lipitor   docusate sodium 100 MG capsule Commonly known as:  COLACE   feeding supplement (ENSURE ENLIVE) Liqd   hydrALAZINE 25 MG tablet Commonly known as:  APRESOLINE   hydrocortisone 1 % ointment   hydroxychloroquine 200 MG tablet Commonly known as:  PLAQUENIL   losartan 100 MG tablet Commonly known as:  COZAAR   lubriskin Lotn   mometasone 0.1 % cream Commonly known as:  ELOCON   pantoprazole 40 MG tablet Commonly known as:  PROTONIX   rivaroxaban 20 MG Tabs tablet Commonly known as:  XARELTO   spironolactone 25 MG tablet Commonly known as:  Aldactone   triamcinolone ointment 0.1  % Commonly known as:  KENALOG     TAKE these medications   heparin 25000-0.45 UT/250ML-% infusion Inject 700 Units/hr into the vein continuous.   methylPREDNISolone sodium succinate 1,000 mg in sodium chloride 0.9 % 50 mL Inject 1,000 mg into the vein daily.          Total Time in preparing paper work, data evaluation and todays exam - 75 minutes  Dustin Flock M.D on 08/27/2018 at [place]  [phone]

## 2018-08-27 NOTE — Progress Notes (Signed)
Pt back to unit from MRI; reported off to oncoming RN. Delia Heady RN

## 2018-08-27 NOTE — Progress Notes (Signed)
New orders now received from MD. Will closely monitor pt. Delia Heady RN

## 2018-08-27 NOTE — H&P (Signed)
History and Physical   Valerie Bell JKK:[id number] DOB: [birth year] DOA: 08/27/2018  Referring MD/NP/PA: [facility]  PCP: [facility]   Outpatient Specialists: [facility]  Patient coming from: [place] [facility]  Chief Complaint: Altered mental status  HPI: Valerie Bell is a 53 y.o. female with medical history significant of collagen vascular disease, hypertension, history of DVT, anxiety disorder, lupus that was transferred for [place] [facility] where she was seen for altered mental status.  Patient was seen and evaluated and also seen by neurology.  She has had lupus for about 2 years.  She was initially having difficulty walking.  She suddenly had episode of staring and unresponsiveness and became nonverbal.  Patient suspected to have had significant seizure.  Was evaluated.  Initial work-up was all negative.  Neurology has seen the patient and the only significant finding was THC in her drug screen.  They recommended transfer here after a rheumatology consult.  Lumbar puncture as well as steroids were recommended.  She is to be seen here and get Long-term EEG.  Review of Systems: As per HPI otherwise 10 point review of systems negative.    Past Medical History:  Diagnosis Date  . Anxiety   . Collagen vascular disease ([place])   . DVT (deep venous thrombosis) ([place])   . Hypertension   . Lupus (systemic lupus erythematosus) ([place])   . Spinal cord lesion [facility])     Past Surgical History:  Procedure Laterality Date  . ABDOMINAL HYSTERECTOMY    . BREAST BIOPSY Right 17+ years ago   negative Core Bx     reports that she quit smoking about 14 years ago. Her smoking use included cigarettes. She has a 1.20 pack-year smoking history. She has never used smokeless tobacco. She reports current drug use. Drug: Marijuana. She reports that she does not drink alcohol.  Allergies  Allergen Reactions  . Ace Inhibitors Other (See Comments)    Angioedema of face     Family History  Problem Relation Age of Onset  . Arthritis Mother   . Hypertension Mother   . Breast cancer Neg Hx      Prior to Admission medications   Medication Sig Start Date End Date Taking? Authorizing Provider  atorvastatin (LIPITOR) 80 MG tablet Take 80 mg by mouth daily.   Yes [provider]  hydrALAZINE (APRESOLINE) 25 MG tablet Take 25 mg by mouth 3 (three) times daily.   Yes [provider]  hydrocortisone ([employer] CORTISONE MAXIMUM STRENGTH) 1 % ointment Apply 1 application topically 2 (two) times daily.   Yes [provider]  hydroxychloroquine (PLAQUENIL) 200 MG tablet Take 200 mg by mouth daily.   Yes [provider]  losartan (COZAAR) 100 MG tablet Take 100 mg by mouth daily.   Yes [provider]  Rivaroxaban (XARELTO STARTER PACK PO) Take 15-20 mg by mouth.    Yes [provider]  spironolactone (ALDACTONE) 25 MG tablet Take 25 mg by mouth daily.   Yes [provider]  heparin 25000-0.45 UT/250ML-% infusion Inject 700 Units/hr into the vein continuous. 08/25/18   Dustin Flock, MD  methylPREDNISolone sodium succinate 1,000 mg in sodium chloride 0.9 % 50 mL Inject 1,000 mg into the vein daily. 08/26/18   Dustin Flock, MD    Physical Exam: Vitals:   08/27/18 1628 08/27/18 1650 08/27/18 2016  BP: (!) 146/88  (!) 172/90  Pulse: 77  (!) 56  Resp: 16  18  Temp: 98.7 F (37.1  C)  98.5 F (36.9 C)  TempSrc: Oral  Oral  SpO2: 100%  100%  Weight:  48.4 kg   Height:  5\' 3"  (1.6 m)       Constitutional: NAD, obtunded, arousable, not able to give history Vitals:   08/27/18 1628 08/27/18 1650 08/27/18 2016  BP: (!) 146/88  (!) 172/90  Pulse: 77  (!) 56  Resp: 16  18  Temp: 98.7 F (37.1 C)  98.5 F (36.9 C)  TempSrc: Oral  Oral  SpO2: 100%  100%  Weight:  48.4 kg   Height:  5\' 3"  (1.6 m)    Eyes: PERRL, lids and conjunctivae normal ENMT: Mucous membranes are moist. Posterior pharynx clear of  any exudate or lesions.Normal dentition.  Neck: normal, supple, no masses, no thyromegaly Respiratory: clear to auscultation bilaterally, no wheezing, no crackles. Normal respiratory effort. No accessory muscle use.  Cardiovascular: Regular rate and rhythm, no murmurs / rubs / gallops. No extremity edema. 2+ pedal pulses. No carotid bruits.  Abdomen: no tenderness, no masses palpated. No hepatosplenomegaly. Bowel sounds positive.  Musculoskeletal: no clubbing / cyanosis. No joint deformity upper and lower extremities. Good ROM, no contractures. Normal muscle tone.  Skin: no rashes, lesions, ulcers. No induration Neurologic: CN 2-12 grossly intact. Sensation intact, DTR normal. Strength 5/5 in all 4.  Psychiatric: Confused, obtunded, not communicating    Labs on Admission: I have personally reviewed following labs and imaging studies  CBC: Recent Labs  Lab 08/23/18 1821 08/25/18 1133 08/26/18 0222 08/27/18 0455  WBC 10.1 10.1 17.0* 14.5*  NEUTROABS 8.4*  --   --  13.0*  HGB 14.4 13.2 12.1 11.7*  HCT 44.4 39.5 36.8 36.7  MCV 96.7 95.4 97.4 97.9  PLT 161 140* 125* 938*   Basic Metabolic Panel: Recent Labs  Lab 08/23/18 1936  NA 138  K 4.0  CL 102  CO2 27  GLUCOSE 93  BUN 21*  CREATININE 0.86  CALCIUM 9.2   GFR: Estimated Creatinine Clearance: 58.5 mL/min (by C-G formula based on SCr of 0.86 mg/dL). Liver Function Tests: Recent Labs  Lab 08/23/18 1936  AST 19  ALT 12  ALKPHOS 55  BILITOT 0.3  PROT 7.9  ALBUMIN 4.0   No results for input(s): LIPASE, AMYLASE in the last 168 hours. Recent Labs  Lab 08/23/18 2107  AMMONIA <9*   Coagulation Profile: Recent Labs  Lab 08/25/18 1133  INR 1.3*   Cardiac Enzymes: Recent Labs  Lab 08/23/18 1936  TROPONINI <0.03   BNP (last 3 results) No results for input(s): PROBNP in the last 8760 hours. HbA1C: No results for input(s): HGBA1C in the last 72 hours. CBG: Recent Labs  Lab 08/23/18 1800  GLUCAP 79    Lipid Profile: No results for input(s): CHOL, HDL, LDLCALC, TRIG, CHOLHDL, LDLDIRECT in the last 72 hours. Thyroid Function Tests: No results for input(s): TSH, T4TOTAL, FREET4, T3FREE, THYROIDAB in the last 72 hours. Anemia Panel: No results for input(s): VITAMINB12, FOLATE, FERRITIN, TIBC, IRON, RETICCTPCT in the last 72 hours. Urine analysis:    Component Value Date/Time   COLORURINE AMBER (A) 08/23/2018 2107   APPEARANCEUR CLEAR (A) 08/23/2018 2107   APPEARANCEUR Clear 07/13/2013 1022   LABSPEC 1.030 08/23/2018 2107   LABSPEC 1.029 07/13/2013 1022   PHURINE 5.0 08/23/2018 2107   GLUCOSEU NEGATIVE 08/23/2018 2107   GLUCOSEU Negative 07/13/2013 1022   HGBUR NEGATIVE 08/23/2018 2107   BILIRUBINUR NEGATIVE 08/23/2018 2107   BILIRUBINUR Negative 07/13/2013 1022   KETONESUR  5 (A) 08/23/2018 2107   PROTEINUR NEGATIVE 08/23/2018 2107   NITRITE NEGATIVE 08/23/2018 2107   LEUKOCYTESUR NEGATIVE 08/23/2018 2107   LEUKOCYTESUR Negative 07/13/2013 1022   Sepsis Labs: @LABRCNTIP (procalcitonin:4,lacticidven:4) )No results found for this or any previous visit (from the past 240 hour(s)).   Radiological Exams on Admission: No results found. Assessment/Plan Principal Problem:   Encephalitis Active Problems:   Benign essential HTN   Leucocytosis   Lupus (HCC)   DVT (deep vein thrombosis) in pregnancy     #1 acute encephalitis: Work-up continuing.  Exact cause unknown.  Patient may end up getting lumbar puncture in the morning.  EEG as recommended.  Follow other recommendations by neurology.  #2 benign essential hypertension: Blood pressure is stable at this point.  Patient is confused.  Unable to get full history.  #3 history of DVT: On full anticoagulation.  If not able to take p.o.'s we will use heparin or Lovenox.  #4 history of lupus: Patient currently on steroids.  Continue with steroids.   DVT prophylaxis: Heparin  Code Status: Full code Family Communication: No family  at bedside Disposition Plan: To be determined Consults called: Neurology Dr. Malen Gauze Admission status: Inpatient  Severity of Illness: The appropriate patient status for this patient is INPATIENT. Inpatient status is judged to be reasonable and necessary in order to provide the required intensity of service to ensure the patient's safety. The patient's presenting symptoms, physical exam findings, and initial radiographic and laboratory data in the context of their chronic comorbidities is felt to place them at high risk for further clinical deterioration. Furthermore, it is not anticipated that the patient will be medically stable for discharge from the hospital within 2 midnights of admission. The following factors support the patient status of inpatient.   " The patient's presenting symptoms include altered mental status. " The worrisome physical exam findings include confused unable to give history. " The initial radiographic and laboratory data are worrisome because of no significant findings on MRI. " The chronic co-morbidities include lupus.   * I certify that at the point of admission it is my clinical judgment that the patient will require inpatient hospital care spanning beyond 2 midnights from the point of admission due to high intensity of service, high risk for further deterioration and high frequency of surveillance required.Barbette Merino MD [facility] Pager [phone]  If 7PM-7AM, please contact night-coverage [email or web address] Password [facility]  08/27/2018, 10:25 PM

## 2018-08-27 NOTE — Progress Notes (Signed)
Pt has no orders yet; MD paged and text paged to notify. Awaiting on orders for pt. Will continue to closely monitor. Delia Heady RN

## 2018-08-27 NOTE — Progress Notes (Signed)
[facility] for heparin Indication: DVT  Allergies  Allergen Reactions  . Ace Inhibitors     Angioedema of face    Patient Measurements: Height: 5\' 3"  (160 cm) Weight: 100 lb (45.4 kg) IBW/kg (Calculated) : 52.4 Heparin Dosing Weight: 45.4 kg  Vital Signs: Temp: 97.8 F (36.6 C) (03/09 2340) Temp Source: Oral (03/09 2340) BP: 153/85 (03/09 2357) Pulse Rate: 49 (03/09 2340)  Labs: Recent Labs    08/25/18 1133  08/26/18 0222 08/26/18 0933 08/26/18 1531 08/27/18 0455  HGB 13.2  --  12.1  --   --   --   HCT 39.5  --  36.8  --   --   --   PLT 140*  --  125*  --   --   --   APTT 30  --   --   --   --   --   LABPROT 16.2*  --   --   --   --   --   INR 1.3*  --   --   --   --   --   HEPARINUNFRC <0.10*   < > 0.71* 0.62 0.58 0.40   < > = values in this interval not displayed.    Estimated Creatinine Clearance: 54.8 mL/min (by C-G formula based on SCr of 0.86 mg/dL).   Medical History: Past Medical History:  Diagnosis Date  . Anxiety   . Collagen vascular disease ([place])   . DVT (deep venous thrombosis) ([place])   . Hypertension   . Lupus (systemic lupus erythematosus) ([place])   . Spinal cord lesion [facility])     Assessment: 53 year old female on Xarelto 20 mg daily PTA. Last dose reported 3/5 at 0800. Pharmacy consulted for heparin dosing for VTE treatment.Heparin 2500 unit bolus followed by heparin drip at 700 units/hr. Baseline heparin < 0.10.   3/8 @1759  HL = 1.07 - Infusion rate decreased to 550 units/hr 3/9 @ 0222 HL = 0.71 3/9 @ 0933 HL = 0.62  Goal of Therapy:  Heparin level 0.3-0.7 units/ml Monitor platelets by anticoagulation protocol: Yes   Plan:  03/10 @ 0500 HL 0.40 therapeutic. Will continue current rate and will recheck w/ am labs.  Tobie Lords, PharmD, BCPS Clinical Pharmacist 08/27/2018

## 2018-08-27 NOTE — Progress Notes (Signed)
Pt transported off unit to MRI. P. Amo Keionte Swicegood RN 

## 2018-08-27 NOTE — Progress Notes (Addendum)
Subjective: Patient awake, alert but not oriented. Able to follow simple commands but will repeat sentences. She is however not back to baseline. Unclear what her baseline is but per her roommate yesterday, she appeared to be at 80%.  Objective: Current vital signs: BP (!) 167/80 (BP Location: Right Arm)   Pulse (!) 43   Temp 98.6 F (37 C) (Axillary)   Resp 17   Ht 5\' 3"  (1.6 m)   Wt 45.4 kg   LMP 12/29/2001   SpO2 100%   BMI 17.71 kg/m  Vital signs in last 24 hours: Temp:  [97.8 F (36.6 C)-98.6 F (37 C)] 98.6 F (37 C) (03/10 0752) Pulse Rate:  [40-80] 43 (03/10 0752) Resp:  [17-18] 17 (03/10 0752) BP: (147-177)/(80-100) 167/80 (03/10 0752) SpO2:  [99 %-100 %] 100 % (03/10 0752)  Intake/Output from previous day: 03/09 0701 - 03/10 0700 In: 420 [I.V.:420] Out: -  Intake/Output this shift: No intake/output data recorded. Nutritional status:  Diet Order            Diet NPO time specified  Diet effective now             Neurologic Exam: Mental Status: Alert.  Unable to assess orientation.  When asked where she is, she unable to tell me where she is and stares blankly in space.  Does not follow commands well even with non-verbal cues.   Cranial Nerves: II: Blinks to bilateral confrontation, pupils equal, round, reactive to light and accommodation III,IV, VI: ptosis not present, extra-ocular motions intact bilaterally V,VII: smile symmetric, facial light touch sensation normal bilaterally VIII: hearing normal bilaterally IX,X: gag reflex present XI: bilateral shoulder shrug XII: midline tongue extension Motor: Lifts left arms off the bed and able to maintain. Increased tone in right arm.  Flexes the legs but does not lift all the bed  Lab Results: Basic Metabolic Panel: Recent Labs  Lab 08/23/18 1936  NA 138  K 4.0  CL 102  CO2 27  GLUCOSE 93  BUN 21*  CREATININE 0.86  CALCIUM 9.2    Liver Function Tests: Recent Labs  Lab 08/23/18 1936  AST 19   ALT 12  ALKPHOS 55  BILITOT 0.3  PROT 7.9  ALBUMIN 4.0   No results for input(s): LIPASE, AMYLASE in the last 168 hours. Recent Labs  Lab 08/23/18 2107  AMMONIA <9*    CBC: Recent Labs  Lab 08/23/18 1821 08/25/18 1133 08/26/18 0222 08/27/18 0455  WBC 10.1 10.1 17.0* 14.5*  NEUTROABS 8.4*  --   --  13.0*  HGB 14.4 13.2 12.1 11.7*  HCT 44.4 39.5 36.8 36.7  MCV 96.7 95.4 97.4 97.9  PLT 161 140* 125* 123*    Cardiac Enzymes: Recent Labs  Lab 08/23/18 1936  TROPONINI <0.03    Lipid Panel: No results for input(s): CHOL, TRIG, HDL, CHOLHDL, VLDL, LDLCALC in the last 168 hours.  CBG: Recent Labs  Lab 08/23/18 1800  GLUCAP 79    Microbiology: No results found for this or any previous visit.  Coagulation Studies: Recent Labs    08/25/18 1133  LABPROT 16.2*  INR 1.3*    Imaging: No results found.  Medications:  I have reviewed the patient's current medications. Prior to Admission:  Medications Prior to Admission  Medication Sig Dispense Refill Last Dose  . atorvastatin (LIPITOR) 80 MG tablet Take 1 tablet (80 mg total) by mouth daily at 6 PM. 30 tablet 1 08/22/2018 at 1800  . docusate sodium (COLACE) 100  MG capsule Take 100 mg by mouth daily.   prn at prn  . hydrALAZINE (APRESOLINE) 25 MG tablet Take 1 tablet (25 mg total) by mouth 3 (three) times daily. 90 tablet 1 08/22/2018 at Unknown time  . hydrocortisone 1 % ointment Apply 1 application topically 2 (two) times daily. 30 g 0 08/22/2018 at Unknown time  . hydroxychloroquine (PLAQUENIL) 200 MG tablet Take 100-200 mg by mouth 2 (two) times daily.   Past Week at Unknown time  . losartan (COZAAR) 100 MG tablet Take 100 mg by mouth daily.   08/22/2018 at 0800  . lubriskin (NUTRADERM) LOTN Apply 1 application topically every hour as needed for dry skin.   08/22/2018 at prn  . mometasone (ELOCON) 0.1 % cream Apply 1 application topically daily.   08/22/2018 at prn  . rivaroxaban (XARELTO) 20 MG TABS tablet Take 20 mg  by mouth daily.   08/22/2018 at 0800  . spironolactone (ALDACTONE) 25 MG tablet Take 1 tablet (25 mg total) by mouth daily. 30 tablet 1 08/22/2018 at 0800  . triamcinolone ointment (KENALOG) 0.1 % Apply 1 application topically 2 (two) times daily.   08/22/2018 at prn  . feeding supplement, ENSURE ENLIVE, (ENSURE ENLIVE) LIQD Take 237 mLs by mouth 3 (three) times daily between meals. 60 mL 0   . pantoprazole (PROTONIX) 40 MG tablet Take 1 tablet (40 mg total) by mouth daily. (Patient not taking: Reported on 08/23/2018) 30 tablet 0 Not Taking at Unknown time  . spironolactone (ALDACTONE) 25 MG tablet Take 1 tablet (25 mg total) by mouth 2 (two) times daily. 60 tablet 11 03/29/2017 at 1800   Scheduled: . feeding supplement (ENSURE ENLIVE)  237 mL Oral TID BM  . hydrocortisone  1 application Topical BID   Patient seen and examined.  Clinical course and management discussed.  Necessary edits performed.  I agree with the above.  Assessment and plan of care developed and discussed below.  Assessment/Plan: Patient improved but not back to baseline.  Unsure what her baseline is but per her roommate yesterday, she is at 80%. Today is day#4 of Solumedrol.  Appears to be tolerating well.   Recommendations: 1.  Continue Solumedrol for 5 days 2.  GI prophylaxis 3.  PT evaluation   This patient was staffed with Dr. Magda Paganini, Doy Mince who personally evaluated patient, reviewed documentation and agreed with assessment and plan of care as above.  Rufina Falco, DNP, FNP-BC Board certified Nurse Practitioner Neurology Department   LOS: 0 days   08/27/2018  10:59 AM  Alexis Goodell, MD Neurology [phone]  08/27/2018  11:09 AM

## 2018-08-27 NOTE — Consult Note (Signed)
Reason for Consult: Lupus cerebritis  Referring Physician: Dr. Posey Pronto.  Hospitalist.  Geralyn Corwin   HPI: 53 year old African-American female.  Complicated past history.  History from chart and records as patient could not give reliable history  She has history of skin rash.  Saw dermatology several years ago.  Thought to be cutaneous lupus.  Had biopsy.  ANA and rheumatologic work-up was negative for systemic lupus.  She has been on Plaquenil  2018 she had DVT.  After that was discovered to have lupus anticoagulant.  She is been on anticoagulation.  Has had an aortic mural thrombus.  8 months ago she had progressive onset of weakness in the lower extremities.  She had hospitalization at [facility].  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  Lumbar puncture showed some red cells and few white cells low lymphocytes.  Culture negative.  Cytology negative.  1 oligoclonal band.  1 monoclonal serum protein band.  She had ANA 1-80 and 06/19/1958.  Negative DNA.  Normal complements.  She was treated with IV steroids.  Some improvement.  Was discharged with therapy prolonged.  Used a walker.  Repeat MRI showed resolution of the thoracic mass.  She was not placed on phonic immunosuppression.  Was then lost to follow-up.  She was admitted 2 to 3 days ago with decreased mental status.  Was obtunded upon arrival.  Drug screen was positive only for cannabis..  She was not febrile.  She had MRI which showed some atrophy.  CT scan unremarkable.  CTA did not show vasculitis or clots.  Sed rate 29.  White count 14,000 on IV steroids.  Platelets 123,000.  Normal creatinine.  Mental status has improved.  Is more communicative with staff.  Question was about next therapy.  Anti-DNA negative.   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  Lupus anticoagulant.  Cerebritis.  SURGICAL HISTORY: No joint surgeries  Family History: Unobtainable  Social  History: Unobtainable  Allergies:  Allergies  Allergen Reactions  . Ace Inhibitors     Angioedema of face    Medications:  Scheduled: . feeding supplement (ENSURE ENLIVE)  237 mL Oral TID BM  . hydrocortisone  1 application Topical BID        ROS: Unobtainable   PHYSICAL EXAM: Blood pressure (!) 167/80, pulse (!) 43, temperature 98.6 F (37 C), temperature source Axillary, resp. rate 17, height 5\' 3"  (1.6 m), weight 45.4 kg, last menstrual period 12/29/2001, SpO2 100 %. I had to wake the patient up to examiner.  She was unable to follow meaningful commands.  Mainly had vision to the left.  Answered yes to every question.  Stooled on herself. Hyperpigmentation of the face.  No frank discoid lesions of the scalp no alopecia oropharynx is clear.  No sclerodactyly.  No hand telangiectasias.  Clear chest.  Regular rhythm.  No significant murmur.  Nontender abdomen.  No significant edema Musculoskeletal: Neck is supple.  Moves extremities reasonably.  Hands without synovitis.  No knee effusions Neurologic: Difficulty and following any commands.  Answers yes to every question.  Keeps her gaze to the left but can move it to the right.  Reactive pupils constricted.  Has increased tone in the right shoulder and right elbow less so in the left arm.  Cannot follow instructions to grab my fingers.  Hips seem to have a reasonable tone as do the knees.  Assessment: Encephalopathy.  Unclear etiology.  Prior history of cutaneous lupus and lupus anticoagulant as well as multi-infarcts and thoracic spine mass which improved with IV steroids previously though she was not back to her baseline as she still had weakness in the legs with walker use.  Negative anti-DNA and trivial prior elevation of ANA though lupus cerebritis is still a possibility.  Cannot rule out seizures.  Cannot rule out other immune neurologic disorders  Recommendations: After I examined the patient, I was told that the patient was  being transferred to [facility].  Would recommend that she have EEG and lumbar puncture and urinalysis to rule out proteinuria.  Complement studies.  If this is thought to be cerebritis or immune mediated, she may need Cytoxan but would wait until further work-up to add another immunosuppressive agent of steroids  Emmaline Kluver 08/27/2018, 1:12 PM

## 2018-08-27 NOTE — Evaluation (Addendum)
Clinical/Bedside Swallow Evaluation Patient Details  Name: Valerie Bell MRN: [id number] Date of Birth: [birth year]  Today's Date: 08/27/2018 Time:        Past Medical History:  Past Medical History:  Diagnosis Date  . Anxiety   . Collagen vascular disease ([place])   . DVT (deep venous thrombosis) ([place])   . Hypertension   . Lupus (systemic lupus erythematosus) ([place])   . Spinal cord lesion [facility])    Past Surgical History:  Past Surgical History:  Procedure Laterality Date  . ABDOMINAL HYSTERECTOMY    . BREAST BIOPSY Right 17+ years ago   negative Core Bx   HPI:  Per admitting H&P: this pt with past medical history of lupus, collagen vascular disease, hypertension and recent diagnosis of DVT presents to the emergency department due to altered mental status.  The patient's roommate and son report that she has not been speaking in her usual manner.  She has also begun to speak less and is unable to say her name upon presentation to the emergency department.  Earlier in the evening when her son went to check on her he had to direct and coach her how to unlock her door.  CT of the patient's head showed no acute abnormality.  MRI of her brain also showed no stroke.  The patient continued to alternate between somnolence and lethargy.  Screen was positive only for marijuana.  Due to ongoing altered mental status emergency department staff called the hospitalist service for admission.   Assessment / Plan / Recommendation Clinical Impression  Pt appears to present with oral preparatory phase (initiation/acceptance decline) Dysphagia, impacted by stiffness/rigidity of hand & mandible, but w/ reduced risk of aspiration when following aspiration precautions - any potential oral phase Dysphagia can increase risk for pharyngeal phase Dysphagia. At rest, pt exhibits pinpoint pupils and full body stiffness/rigidity and was unable to voluntarily initiate and follow basic task instructions (raise arm, open  mouth, etc). Same was noted for all PO trials; pt requires FULL assist w/ feeding and initiating tasks (holding cup, placing straw at mouth, tactile stimulation w/ spoon). Important to note that pt responds involuntary w/ verbal/tactile cues while feeding - pulls from Straw, closes mouth around spoon, chews adequately, and initiated swallow function w/ PO in mouth (requires min verbal cues). However, stiffness/rigity returns immediately after involuntary/automatic completion of tasks and w/ any volitional tasks. Pt was awake and alert, however easily distractible (often gazing out window absently) and required mod cueing throughout BSE. When asked open-ended questions, pt typically responds w/ "yeah" or "okay" and was unable to provide further information (ex- name, family/friend's name, food preference, etc). Unsure of pt's baseline cognitive status - needs to be determined. ST administered brief OM exam, which revealed adequate tongue strength and no overt signs of facial droop - difficult to assess cheek, jaw, lip strength d/t stiffness/rigidity and clenched position at rest.   ST administered PO trials of ice chips (x2) via Spoon - tactile cueing w/ spoon at lips to initiate taking ice chip from spoon. Pt appropriately manipulated, demonstrated WFL rotary chewing, and A-P transfer w/o any immediate overt s/s of aspiration noted (throat clearing, coughing, choking, changes in respiration) - same was noted w/ 2-3 tsp of ice cream. Similar was noted w/ PO trials of thin liquids (water) via Cup rim - ST wrapped pt's hand around cup and held w/ ST, however ST had to tip cup. When she sensed water, pt pursed lips around Cup and involuntarily took 2-3  small sips of water. D/t this delayed reaction and lack of initiation, ST switched to Straw use - pt still required assistance holding Cup and placing Straw at lips, however pt did involuntarily/automatically initiate Straw pulls and drank ~2oz w/o any immediate,  overt s/s of aspiration noted. Important to note pt did exhibit min oral holding of thin liquid x1; able to trigger swallow when verbally cued. ST then administered 3-4 tsp PO trials of Puree and soft solid (applesauce w/ graham cracker) - similar was noted as with above PO trials. Pt required tactile stimulation at lips w/ spoon for initiation to take bolus from Spoon, then demonstrated intuitive/automatic response to chew/munch min slowly, orally manipulate and swallow bolus w/ timely A-P transfer. D/t general oral stiffness/rigidity and decreased ability to initiate lingual sweeping and oral clearing, pt benefits from alternating between foods and liquids at meal times. ST to f/u tomorrow re: toleration and potential upgrade of diet. NSG consulted and agreed.  Recommend: Dysphagia II (MINCED) W/ extra gravies, butters, sauces for cohesion and easier chewing w/ THIN liquids via Straw. FULL Assist d/t generalized stiffness and verbal/tactile cues required at meal times! General aspiration precautions & Alternate between small bites & sips for oral clearing. Meds CRUSHED in Puree.   SLP Visit Diagnosis: Dysphagia, oral phase (R13.11)    Aspiration Risk  Mild aspiration risk    Diet Recommendation   Dysphagia II (MINCED) W/ extra gravies, butters, sauces for cohesion and easier chewing w/ THIN liquids via Straw. FULL Assist!  Medication Administration: Crushed with puree    Other  Recommendations Recommended Consults: Other (Comment)(Dietician consult) Oral Care Recommendations: Oral care BID;Oral care before and after PO Other Recommendations: Remove water pitcher   Follow up Recommendations Skilled Nursing facility(TBD)      Frequency and Duration min 2x/week  1 week       Prognosis Prognosis for Safe Diet Advancement: Guarded Barriers to Reach Goals: Severity of deficits      Swallow Study   General Date of Onset: 08/27/18 HPI: Per admitting H&P: this pt with past medical history  of lupus, collagen vascular disease, hypertension and recent diagnosis of DVT presents to the emergency department due to altered mental status.  The patient's roommate and son report that she has not been speaking in her usual manner.  She has also begun to speak less and is unable to say her name upon presentation to the emergency department.  Earlier in the evening when her son went to check on her he had to direct and coach her how to unlock her door.  CT of the patient's head showed no acute abnormality.  MRI of her brain also showed no stroke.  The patient continued to alternate between somnolence and lethargy.  Screen was positive only for marijuana.  Due to ongoing altered mental status emergency department staff called the hospitalist service for admission. Type of Study: Bedside Swallow Evaluation Diet Prior to this Study: NPO Temperature Spikes Noted: No(WBC 14.5) Respiratory Status: Room air History of Recent Intubation: No Behavior/Cognition: Alert;Cooperative;Confused;Distractible;Requires cueing Oral Cavity Assessment: Within Functional Limits Oral Care Completed by SLP: No Oral Cavity - Dentition: Adequate natural dentition;Missing dentition;Other (Comment)(some missing dentition; jaw clenched and stiff at rest) Vision: Functional for self-feeding Self-Feeding Abilities: Total assist Patient Positioning: Upright in bed Baseline Vocal Quality: Normal;Low vocal intensity Volitional Cough: Other (Comment)(NT) Volitional Swallow: Unable to elicit    Oral/Motor/Sensory Function Overall Oral Motor/Sensory Function: Generalized oral weakness Facial ROM: Reduced right;Reduced left Facial Symmetry: Within  Functional Limits Facial Strength: Reduced right;Reduced left Facial Sensation: Within Functional Limits Lingual ROM: Reduced right;Reduced left Lingual Symmetry: Within Functional Limits Lingual Strength: Within Functional Limits Lingual Sensation: Within Functional Limits Velum:  Within Functional Limits Mandible: Impaired(clenched mandible at rest & w/ voluntary tasks)   Ice Chips Ice chips: Impaired Presentation: Spoon Oral Phase Impairments: Reduced labial seal Oral Phase Functional Implications: Right anterior spillage(min) Pharyngeal Phase Impairments: Other (comments)(N/A)   Thin Liquid Thin Liquid: Within functional limits Presentation: Straw Other Comments: requires full assist w/ straw; purses lips around and demonstrates timely A-P transfer w/ min-mod cues    Nectar Thick Nectar Thick Liquid: Not tested   Honey Thick Honey Thick Liquid: Not tested   Puree Puree: Impaired Presentation: Spoon Oral Phase Impairments: Reduced labial seal(and initiation) Oral Phase Functional Implications: Right anterior spillage(min) Pharyngeal Phase Impairments: Other (comments)(N/A)   Solid     Solid: Impaired Presentation: Spoon Oral Phase Impairments: Other (comment)(min initiation) Oral Phase Functional Implications: Right lateral sulci pocketing;Left lateral sulci pocketing Pharyngeal Phase Impairments: Other (comments)(N/A)      Emeline General, Graduate Student SLP 08/27/2018,12:54 PM

## 2018-08-27 NOTE — Consult Note (Addendum)
Neurology Consultation  Reason for Consult: Altered mental status Referring Physician: Dr. Earnest Conroy, [facility]  CC: Altered mental status  History is obtained from: Chart review  HPI: Valerie Bell is a 53 y.o. female with documented history of anxiety, collagen vascular disease, DVT, hypertension, lupus, presenting to the hospital as a transfer from [facility], where she was initially seen by neurology for altered mental status. According to the initial neurology consult dictated by Dr. Orlena Sheldon, the patient has had lupus for 2 years and was brought into [place] [facility] because she was having difficulty walking and had episode of staring and unresponsiveness during which time she was also nonverbal. There was no reported head injury.  MRI of the brain was performed which was unremarkable.  CTA head and neck were also unremarkable.  At that time, CBC, CMP, ammonia, ESR, TSH and urinalysis along with a troponin I were all negative.  Urinary drug screen was positive for THC. The patient is not able to provide any reliable history at this time. There is no family member at the bedside to be able to obtain more history. At [facility], patient was started on IV Solu-Medrol for presumptive lupus cerebritis and has completed 4 days of IV Solu-Medrol and per progress notes from Dr. Orlena Sheldon and Dr. Doy Mince, is doing better than before.  It is unclear what her baseline is at this time. He was transferred to [facility] for long-term EEG. She is also been evaluated by rheumatology-Dr. Jefm Bryant, who recommends continuing steroids, obtaining lumbar puncture as well as urinalysis to rule out proteinuria.  In addition he also recommends complement studies.  In his opinion, if this is deemed to be a cerebritis or immune mediated process, she might need Cytoxan pending further work-up.  To the best of my chart review, a spot EEG has not been performed. A spinal tap was not  performed at [place] [facility].   ROS: Unable to obtain due to altered mental status.   Past Medical History:  Diagnosis Date  . Anxiety   . Collagen vascular disease ([place])   . DVT (deep venous thrombosis) ([place])   . Hypertension   . Lupus (systemic lupus erythematosus) ([place])   . Spinal cord lesion [facility])     Family History  Problem Relation Age of Onset  . Arthritis Mother   . Hypertension Mother   . Breast cancer Neg Hx   Listed in the chart-patient unable to confirm.  Social History:   reports that she quit smoking about 14 years ago. Her smoking use included cigarettes. She has a 1.20 pack-year smoking history. She has never used smokeless tobacco. She reports current drug use. Drug: Marijuana. She reports that she does not drink alcohol. Patient unable to confirm-listed on the chart  Medications  Current Facility-Administered Medications:  .  heparin ADULT infusion 100 units/mL (25000 units/214m sodium chloride 0.45%), 500 Units/hr, Intravenous, Continuous, Hammons, Kimberly B, RPH, Last Rate: 5 mL/hr at 08/27/18 1823, 500 Units/hr at 08/27/18 1823 .  hydrALAZINE (APRESOLINE) injection 10 mg, 10 mg, Intravenous, Q6H PRN, KEarnest Conroy Neelima, MD .  HYDROcodone-acetaminophen (NORCO/VICODIN) 5-325 MG per tablet 1-2 tablet, 1-2 tablet, Oral, Q4H PRN, KGuilford Shi MD .  [Derrill MemoON 08/28/2018] methylPREDNISolone sodium succinate (SOLU-MEDROL) 1,000 mg in sodium chloride 0.9 % 50 mL IVPB, 1,000 mg, Intravenous, Daily, Kamineni, Neelima, MD .  ondansetron (ZOFRAN) tablet 4 mg, 4 mg, Oral, Q6H PRN **OR** ondansetron (ZOFRAN) injection 4 mg, 4 mg, Intravenous, Q6H PRN, KGuilford Shi MD  Exam: Current vital signs: BP (!) 172/90 (BP Location: Left Arm)   Pulse (!) 56   Temp 98.5 F (36.9 C) (Oral)   Resp 18   Ht '5\' 3"'$  (1.6 m)   Wt 48.4 kg   LMP 12/29/2001   SpO2 100%   BMI 18.90 kg/m  Vital signs in last 24 hours: Temp:  [97.8 F (36.6 C)-98.7 F (37.1 C)] 98.5 F  (36.9 C) (03/10 2016) Pulse Rate:  [40-86] 56 (03/10 2016) Resp:  [16-18] 18 (03/10 2016) BP: (137-177)/(80-100) 172/90 (03/10 2016) SpO2:  [99 %-100 %] 100 % (03/10 2016) Weight:  [48.4 kg] 48.4 kg (03/10 1650) General: Patient is comfortably sitting in bed, eating her meal with a fork in her left hand and her right arm flexed. HEENT: Normocephalic, atraumatic, hyperpigmentation of the face with freckles. CVS: S1-S2 heard regular rhythm Respiratory: Chest clear to auscultation Extremities: Warm well perfused with no other rashes seen on the extremities. Neurological exam Patient is awake, alert.  She is able to tell me her name but unable to answer other questions meaningfully. She responds mainly yes to all questions. She is able to track me bilaterally. She does not follow simple commands. She is also unable to mimic consistently. Cranial nerves: Pupils are equal round reactive light, extraocular movements appear intact, visual fields appear full to threat, face appears symmetric, tongue is midline. Motor exam: Left upper extremities full-strength and antigravity 5/5.  Right upper extremity has increased tone in keeps it flexed.  Is able to stretch it out passively but does not move much.  Both legs unable to stay antigravity. Sensory exam: Grimaces to noxious stimulation equally.  Withdraws both lower extremities some to noxious stimulation. Coordination: Does not follow commands to assess Gait testing was deferred at this time  Labs I have reviewed labs in epic and the results pertinent to this consultation are: Leukocytosis with white count of 14.5- likely secondary to steroids Hemoglobin 11.7 Platelet count 123 Normal sodium, potassium 4.0 Mildly elevated BUN 21 CBC    Component Value Date/Time   WBC 14.5 (H) 08/27/2018 0455   RBC 3.75 (L) 08/27/2018 0455   HGB 11.7 (L) 08/27/2018 0455   HGB 13.5 07/13/2013 1022   HCT 36.7 08/27/2018 0455   HCT 40.4 07/13/2013 1022    PLT 123 (L) 08/27/2018 0455   PLT 160 07/13/2013 1022   MCV 97.9 08/27/2018 0455   MCV 99 07/13/2013 1022   MCH 31.2 08/27/2018 0455   MCHC 31.9 08/27/2018 0455   RDW 12.3 08/27/2018 0455   RDW 13.1 07/13/2013 1022   LYMPHSABS 0.7 08/27/2018 0455   LYMPHSABS 2.3 07/13/2013 1022   MONOABS 0.7 08/27/2018 0455   MONOABS 1.0 (H) 07/13/2013 1022   EOSABS 0.0 08/27/2018 0455   EOSABS 0.1 07/13/2013 1022   BASOSABS 0.0 08/27/2018 0455   BASOSABS 0.0 07/13/2013 1022    CMP     Component Value Date/Time   NA 138 08/23/2018 1936   NA 136 07/13/2013 1022   K 4.0 08/23/2018 1936   K 3.4 (L) 07/13/2013 1022   CL 102 08/23/2018 1936   CL 107 07/13/2013 1022   CO2 27 08/23/2018 1936   CO2 23 07/13/2013 1022   GLUCOSE 93 08/23/2018 1936   GLUCOSE 86 07/13/2013 1022   BUN 21 (H) 08/23/2018 1936   BUN 19 (H) 07/13/2013 1022   CREATININE 0.86 08/23/2018 1936   CREATININE 0.82 07/13/2013 1022   CALCIUM 9.2 08/23/2018 1936   CALCIUM 8.7 07/13/2013  1022   PROT 7.9 08/23/2018 1936   PROT 6.5 07/13/2013 1022   ALBUMIN 4.0 08/23/2018 1936   ALBUMIN 3.1 (L) 07/13/2013 1022   AST 19 08/23/2018 1936   AST 22 07/13/2013 1022   ALT 12 08/23/2018 1936   ALT 10 (L) 07/13/2013 1022   ALKPHOS 55 08/23/2018 1936   ALKPHOS 34 (L) 07/13/2013 1022   BILITOT 0.3 08/23/2018 1936   BILITOT 0.5 07/13/2013 1022   GFRNONAA >60 08/23/2018 1936   GFRNONAA >60 07/13/2013 1022   GFRAA >60 08/23/2018 1936   GFRAA >60 07/13/2013 1022   Imaging I have reviewed the images obtained  MRI examination of the brain-no acute changes seen on the noncontrast enhanced MRI brain.  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 She has been treated with IV Solu-Medrol-has received 4 doses and will complete 5 doses tomorrow. Rheumatology is also seen the patient and recommend spinal tap as  well as continuous EEG monitoring along with complement studies and UA to look for proteinuria. At this time, given her focal exam with right-sided weakness, which I do not know if it is baseline or not, I would repeat an MRI of the brain-also would use contrast to look for any signs of meningeal enhancement or parenchymal enhancement, which might support diagnosis of lupus cerebritis.  Other possible differentials could include neuropsychiatric manifestations of systemic lupus erythematosus which can present with altered level of consciousness/acute confusional state/and or seizures. At this point, I would not emergently do a spinal tap since she is already received multiple days of high dose steroids. I will order long-term EEG after obtaining a spot EEG in the morning.  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 Decision for Cytoxan also deferred till further imaging and further lab testing is made available.  -- Amie Portland, MD [facility] Pager: [phone] If 7pm to 7am, please call on call as listed on AMION.

## 2018-08-27 NOTE — Progress Notes (Signed)
Transfer report received at 1311 and pt arrived to the unit via  carelink at 1625. Pt alert; IV transfusing with heparin drip at 11ml/hr; telemetry applied and verified with CCMD and NT; skin assessment completed with second RN; stage 2 to sacrum which looks like 3 small blister openings; foam dsg applied; pt and family oriented to the unit and room; fall/safety precaution and prevention education completed; bed alarm on; VSS; pt family remain at bedside. Will continue to closely monitor. Delia Heady RN   08/27/18 1628  Vitals  Temp 98.7 F (37.1 C)  Temp Source Oral  BP (!) 146/88  MAP (mmHg) 103  BP Location Left Arm  BP Method Automatic  Patient Position (if appropriate) Lying  Pulse Rate 77  Pulse Rate Source Dinamap  Resp 16  Oxygen Therapy  SpO2 100 %  O2 Device Room Air  MEWS Score  MEWS RR 0  MEWS Pulse 0  MEWS Systolic 0  MEWS LOC 0  MEWS Temp 0  MEWS Score 0  MEWS Score Color Green

## 2018-08-28 ENCOUNTER — Inpatient Hospital Stay (HOSPITAL_COMMUNITY): Payer: Medicare Other

## 2018-08-28 LAB — CBC
HCT: 33.3 % — ABNORMAL LOW (ref 36.0–46.0)
Hemoglobin: 11.2 g/dL — ABNORMAL LOW (ref 12.0–15.0)
MCH: 31.8 pg (ref 26.0–34.0)
MCHC: 33.6 g/dL (ref 30.0–36.0)
MCV: 94.6 fL (ref 80.0–100.0)
Platelets: 115 10*3/uL — ABNORMAL LOW (ref 150–400)
RBC: 3.52 MIL/uL — ABNORMAL LOW (ref 3.87–5.11)
RDW: 12.4 % (ref 11.5–15.5)
WBC: 14.3 10*3/uL — ABNORMAL HIGH (ref 4.0–10.5)
nRBC: 0 % (ref 0.0–0.2)

## 2018-08-28 LAB — URINALYSIS, ROUTINE W REFLEX MICROSCOPIC
Bilirubin Urine: NEGATIVE
Glucose, UA: NEGATIVE mg/dL
Ketones, ur: NEGATIVE mg/dL
Nitrite: NEGATIVE
Protein, ur: 30 mg/dL — AB
RBC / HPF: >50 RBC/hpf — ABNORMAL HIGH (ref 0–5)
Specific Gravity, Urine: 1.026 (ref 1.005–1.030)
pH: 7 (ref 5.0–8.0)

## 2018-08-28 LAB — BASIC METABOLIC PANEL
Anion gap: 8 (ref 5–15)
BUN: 22 mg/dL — ABNORMAL HIGH (ref 6–20)
CO2: 23 mmol/L (ref 22–32)
Calcium: 9.3 mg/dL (ref 8.9–10.3)
Chloride: 109 mmol/L (ref 98–111)
Creatinine, Ser: 0.58 mg/dL (ref 0.44–1.00)
GFR calc Af Amer: >60 mL/min (ref >60)
GFR calc non Af Amer: >60 mL/min (ref >60)
Glucose, Bld: 102 mg/dL — ABNORMAL HIGH (ref 70–99)
Potassium: 3.6 mmol/L (ref 3.5–5.1)
Sodium: 140 mmol/L (ref 135–145)

## 2018-08-28 LAB — HEPARIN LEVEL (UNFRACTIONATED): Heparin Unfractionated: 0.36 IU/mL (ref 0.30–0.70)

## 2018-08-28 NOTE — Evaluation (Signed)
Clinical/Bedside Swallow Evaluation Patient Details  Name: SHERMAN LIPUMA MRN: [id number] Date of Birth: Apr 09, 1966  Today's Date: 08/28/2018 Time: SLP Start Time (ACUTE ONLY): 1430 SLP Stop Time (ACUTE ONLY): 1450 SLP Time Calculation (min) (ACUTE ONLY): 20 min  Past Medical History:  Past Medical History:  Diagnosis Date  . Anxiety   . Collagen vascular disease ([place])   . DVT (deep venous thrombosis) ([place])   . Hypertension   . Lupus (systemic lupus erythematosus) ([place])   . Spinal cord lesion [facility])    Past Surgical History:  Past Surgical History:  Procedure Laterality Date  . ABDOMINAL HYSTERECTOMY    . BREAST BIOPSY Right 17+ years ago   negative Core Bx   HPI:   AHJA MARTELLO is a 53 y.o. female with documented history of anxiety, collagen vascular disease, DVT, hypertension, lupus, presenting to the hospital as a transfer from [facility], where she was initially seen by neurology for altered mental status. According to the initial neurology consult dictated by Dr. Orlena Sheldon, the patient has had lupus for 2 years and was brought into [place] [facility] because she was having difficulty walking and had episode of staring and unresponsiveness during which time she was also nonverbal.  There was no reported head injury.  MRI of the brain was performed which was unremarkable. At [facility], patient was started on IV Solu-Medrol for presumptive lupus cerebritis and has completed 4 days of IV Solu-Medrol and per progress notes from Dr. Orlena Sheldon and Dr. Doy Mince, is doing better than before.  It is unclear what her baseline is at this time. Pt seen by SLP at [facility] on 08/27/18, found to have rigidity but automatic response to tactile cues with PO, no signs of aspiration, started on dys 2/thin liquids.    Assessment / Plan / Recommendation Clinical Impression  Pt demonstrates increased awareness and automaticity from last assessment at [facility]  regional on 3/10. Pt is able to consume sips of thin liquids with a straw with min assist to set up adequate grasp on cup without signs of aspiration. Also able to self feed bites of pineapple with total fading to mod assist for use of fork with LUE. Mastication adequate, pt consumed entire serving without difficulty, no oral or mandibular rigidity noted today. Recommend pt continue regular diet with thin liquids. Would benefit from SLP f/u for cognitive linguistic interventions given overt impairment in this area. Family also requesting f/u.  SLP Visit Diagnosis: Dysphagia, oral phase (R13.11)    Aspiration Risk  Mild aspiration risk    Diet Recommendation Regular;Thin liquid   Liquid Administration via: Cup;Straw Medication Administration: Whole meds with liquid Supervision: Staff to assist with self feeding;Full supervision/cueing for compensatory strategies Compensations: Slow rate;Small sips/bites Postural Changes: Seated upright at 90 degrees    Other  Recommendations     Follow up Recommendations Inpatient Rehab(for cognitive linguistic deficits)      Frequency and Duration            Prognosis        Swallow Study   General HPI:  ANALISIA KINGSFORD is a 53 y.o. female with documented history of anxiety, collagen vascular disease, DVT, hypertension, lupus, presenting to the hospital as a transfer from [facility] hospital, where she was initially seen by neurology for altered mental status. According to the initial neurology consult dictated by Dr. Orlena Sheldon, the patient has had lupus for 2 years and was brought into [place] [facility] because she was having  difficulty walking and had episode of staring and unresponsiveness during which time she was also nonverbal.  There was no reported head injury.  MRI of the brain was performed which was unremarkable. At [facility], patient was started on IV Solu-Medrol for presumptive lupus cerebritis and has completed 4 days  of IV Solu-Medrol and per progress notes from Dr. Orlena Sheldon and Dr. Doy Mince, is doing better than before.  It is unclear what her baseline is at this time. Pt seen by SLP at [facility] on 08/27/18, found to have rigidity but automatic response to tactile cues with PO, no signs of aspiration, started on dys 2/thin liquids.  Type of Study: Bedside Swallow Evaluation Diet Prior to this Study: Regular;Thin liquids Temperature Spikes Noted: No Respiratory Status: Room air History of Recent Intubation: No Behavior/Cognition: Alert;Cooperative;Confused;Distractible;Requires cueing Oral Cavity Assessment: Within Functional Limits Oral Care Completed by SLP: No Oral Cavity - Dentition: Adequate natural dentition;Missing dentition;Other (Comment) Vision: Functional for self-feeding Self-Feeding Abilities: Needs assist Patient Positioning: Upright in bed Baseline Vocal Quality: Normal;Low vocal intensity Volitional Cough: Strong Volitional Swallow: Able to elicit    Oral/Motor/Sensory Function Overall Oral Motor/Sensory Function: Other (comment)(unable to follow oral motor commands)   Ice Chips     Thin Liquid Thin Liquid: Within functional limits Presentation: Straw    Nectar Thick Nectar Thick Liquid: Not tested   Honey Thick Honey Thick Liquid: Not tested   Puree Puree: Not tested   Solid     Solid: Within functional limits      Chalsey Leeth, Katherene Ponto 08/28/2018,3:25 PM

## 2018-08-28 NOTE — Progress Notes (Signed)
PROGRESS NOTE    Valerie Bell  LGX:[id number] DOB: 1966-02-28 DOA: 08/27/2018 PCP: [facility]    Brief Narrative:  Patient is a 53 year old female with history of anxiety, collagen vascular disease, DVT, hypertension and lupus who was admitted to [facility] regional hospital with altered mental status.  Patient does have history of lupus disease with history of difficulty walking and episode of unresponsiveness that was diagnosed about 2 years ago.  Most of the work-up were negative at [facility].  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  She had some improvement of mentation.  Patient was followed by neurology and rheumatology at [facility].  She was transferred to [facility] for continuous EEG monitoring. Reviewing her chart, her mentation has somehow improved.  She is hesitant to use right side of the body.   Assessment & Plan:   Principal Problem:   Encephalitis Active Problems:   Benign essential HTN   Leucocytosis   Lupus (HCC)   DVT (deep vein thrombosis) in pregnancy  Acute encephalitis: Right hemiparesis.  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  Patient is already finishing 5 days of high dose steroid therapy.  Spot EEG with generalized slowing.  Followed by neurology.  Getting continuous video EEG monitoring today.  Remains on symptomatic treatment.  Continue to work with PT OT.  Will refer to inpatient rehab. Neurology not recommending lumbar puncture. MRI of the brain showed 2 punctate foci of the reduced diffusion on corpus callosum and left posterior temporal cortex.  DVT: Recently diagnosed DVT.  She used to be on Xarelto.  That was stopped and currently on heparin pending improvement and any procedure if needed.  Hypertension: Fairly stable.  On as needed medications.   DVT prophylaxis: On heparin infusion Code Status: Full code Family Communication: No family at bedside Disposition  Plan: Acute inpatient rehab   Consultants:   Neurology  Procedures:   Continuous EEG  Antimicrobials:   None   Subjective: Patient was seen and examined at the bedside.  She was alert and awake, she will follow simple commands.  She was with flat affect.  She will keep her right upper arm flexed with some rigidity. No other overnight events.  No reported seizure.  Afebrile.  Objective: Vitals:   08/28/18 0027 08/28/18 0500 08/28/18 0731 08/28/18 1233  BP: (!) 142/73 (!) 156/85 (!) 154/76 (!) 160/83  Pulse: (!) 51 72 (!) 54 72  Resp:  16 18 18   Temp:  98.6 F (37 C) 98 F (36.7 C) 98.4 F (36.9 C)  TempSrc:  Axillary Oral Oral  SpO2:  99% 99% 99%  Weight:      Height:        Intake/Output Summary (Last 24 hours) at 08/28/2018 1440 Last data filed at 08/28/2018 0600 Gross per 24 hour  Intake 82.54 ml  Output 100 ml  Net -17.46 ml   Filed Weights   08/27/18 1650  Weight: 48.4 kg    Examination:  General exam: Appears calm and comfortable  Slightly anxious.  Not in any distress. Respiratory system: Clear to auscultation. Respiratory effort normal. Cardiovascular system: S1 & S2 heard, RRR. No JVD, murmurs, rubs, gallops or clicks. No pedal edema. Gastrointestinal system: Abdomen is nondistended, soft and nontender. No organomegaly or masses felt. Normal bowel sounds heard. Central nervous system: Alert and awake.  She is oriented to place and person.  Not oriented to time. Extremities: Normal motor power and sensory on left  side.  Right upper extremity is flexed and somehow rigid.  Passively can be moved.  Her right leg is 4/5. Skin: No rashes, lesions or ulcers Psychiatry: Judgement and insight appear normal.  Flat affect with anxious mood.    Data Reviewed: I have personally reviewed following labs and imaging studies  CBC: Recent Labs  Lab 08/23/18 1821 08/25/18 1133 08/26/18 0222 08/27/18 0455 08/28/18 0631  WBC 10.1 10.1 17.0* 14.5* 14.3*   NEUTROABS 8.4*  --   --  13.0*  --   HGB 14.4 13.2 12.1 11.7* 11.2*  HCT 44.4 39.5 36.8 36.7 33.3*  MCV 96.7 95.4 97.4 97.9 94.6  PLT 161 140* 125* 123* 034*   Basic Metabolic Panel: Recent Labs  Lab 08/23/18 1936 08/28/18 0631  NA 138 140  K 4.0 3.6  CL 102 109  CO2 27 23  GLUCOSE 93 102*  BUN 21* 22*  CREATININE 0.86 0.58  CALCIUM 9.2 9.3   GFR: Estimated Creatinine Clearance: 62.9 mL/min (by C-G formula based on SCr of 0.58 mg/dL). Liver Function Tests: Recent Labs  Lab 08/23/18 1936  AST 19  ALT 12  ALKPHOS 55  BILITOT 0.3  PROT 7.9  ALBUMIN 4.0   No results for input(s): LIPASE, AMYLASE in the last 168 hours. Recent Labs  Lab 08/23/18 2107  AMMONIA <9*   Coagulation Profile: Recent Labs  Lab 08/25/18 1133  INR 1.3*   Cardiac Enzymes: Recent Labs  Lab 08/23/18 1936  TROPONINI <0.03   BNP (last 3 results) No results for input(s): PROBNP in the last 8760 hours. HbA1C: No results for input(s): HGBA1C in the last 72 hours. CBG: Recent Labs  Lab 08/23/18 1800  GLUCAP 79   Lipid Profile: No results for input(s): CHOL, HDL, LDLCALC, TRIG, CHOLHDL, LDLDIRECT in the last 72 hours. Thyroid Function Tests: No results for input(s): TSH, T4TOTAL, FREET4, T3FREE, THYROIDAB in the last 72 hours. Anemia Panel: No results for input(s): VITAMINB12, FOLATE, FERRITIN, TIBC, IRON, RETICCTPCT in the last 72 hours. Sepsis Labs: No results for input(s): PROCALCITON, LATICACIDVEN in the last 168 hours.  No results found for this or any previous visit (from the past 240 hour(s)).       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 EXAM: MRI HEAD WITHOUT AND WITH CONTRAST TECHNIQUE: Multiplanar, multiecho pulse sequences of the brain and surrounding structures were obtained without and with intravenous contrast. CONTRAST:   5 cc Gadavist COMPARISON:  08/24/2018 CTA of the head. 08/23/2018 CT head and MRI head. FINDINGS: Brain: Motion degradation of several sequences. 2 punctate foci of reduced diffusion are present within the body of corpus callosum and left posterior temporal cortex (series 5 image 70 and 80). Few stable nonspecific T2 hyperintensities are present in periventricular white matter and predominantly the frontal lobes. Moderate diffuse age advanced volume loss of the brain with relative cerebellar sparing. No extra-axial collection, hemorrhage, hydrocephalus, mass effect, or herniation. After administration of intravenous contrast there is no abnormal enhancement. Vascular: Normal flow voids. Skull and upper cervical spine: Normal marrow signal. Sinuses/Orbits: Mild-to-moderate mucosal thickening the left-sided ethmoid air cells, sphenoid sinus, the maxillary sinus. No significant abnormal signal of the mastoid air cells. Orbits are unremarkable. Other: None. IMPRESSION: Motion degradation of several sequences. 1. Two punctate foci of reduced diffusion are present in the body of corpus callosum and left posterior temporal cortex. Findings probably represent  recent microvascular ischemia of uncertain etiology. No secondary findings of vasculitis, meningitis, or encephalitis. 2. Stable moderate age advanced volume loss of the brain. 3. Left-sided paranasal sinus disease. Electronically Signed   By: Kristine Garbe M.D.   On: 08/27/2018 23:35        Scheduled Meds: Continuous Infusions:  heparin 500 Units/hr (08/28/18 0600)     LOS: 1 day    Time spent: 25 minutes     Barb Merino, MD [facility] Pager [phone]  If 7PM-7AM, please contact night-coverage [email or web address] Password [email or web address] 08/28/2018, 2:40 PM

## 2018-08-28 NOTE — Progress Notes (Signed)
[place] for heparin Indication: DVT  Allergies  Allergen Reactions  . Ace Inhibitors Other (See Comments)    Angioedema of face    Patient Measurements: Height: 5\' 3"  (160 cm) Weight: 106 lb 11.2 oz (48.4 kg) IBW/kg (Calculated) : 52.4 Heparin Dosing Weight: 45.4 kg  Vital Signs: Temp: 98 F (36.7 C) (03/11 0731) Temp Source: Oral (03/11 0731) BP: 154/76 (03/11 0731) Pulse Rate: 54 (03/11 0731)  Labs: Recent Labs    08/25/18 1133  08/26/18 0222  08/26/18 1531 08/27/18 0455 08/28/18 0631  HGB 13.2  --  12.1  --   --  11.7* 11.2*  HCT 39.5  --  36.8  --   --  36.7 33.3*  PLT 140*  --  125*  --   --  123* 115*  APTT 30  --   --   --   --   --   --   LABPROT 16.2*  --   --   --   --   --   --   INR 1.3*  --   --   --   --   --   --   HEPARINUNFRC <0.10*   < > 0.71*   < > 0.58 0.40 0.36  CREATININE  --   --   --   --   --   --  0.58   < > = values in this interval not displayed.    Estimated Creatinine Clearance: 62.9 mL/min (by C-G formula based on SCr of 0.58 mg/dL).   Medical History: Past Medical History:  Diagnosis Date  . Anxiety   . Collagen vascular disease ([place])   . DVT (deep venous thrombosis) ([place])   . Hypertension   . Lupus (systemic lupus erythematosus) ([place])   . Spinal cord lesion [facility])     Assessment: 53 year old female on Xarelto 20 mg daily PTA. Last dose reported 3/5 at 0800. Pharmacy consulted for heparin dosing for VTE treatment.  Heparin level therapeutic at 0.36  Goal of Therapy:  Heparin level 0.3-0.7 units/ml Monitor platelets by anticoagulation protocol: Yes   Plan:  Continue heparin gtt at 500 units/hr Daily heparin level, CBC, s/s bleeding  Bertis Ruddy, PharmD Clinical Pharmacist Please check AMION for all [place] numbers 08/28/2018 8:16 AM

## 2018-08-28 NOTE — Evaluation (Signed)
Physical Therapy Evaluation Patient Details Name: Valerie Bell MRN: [id number] DOB: 1965/12/05 Today's Date: 08/28/2018   History of Present Illness  Pt is a 53 y/o female admitted from [facility] secondary to [place]. MRI was negative for any acute findings. Work-up pending. PMH including but not limited to collagen vascular disease, Lupus, anxiety, spinal cord lesion.    Clinical Impression  Pt presented supine in bed with HOB elevated, initially asleep but easily aroused with tactile and loud auditory stimuli. However, pt very flat and with expressive difficulties throughout. Unable to obtain any reliable history information from pt and no family/caregivers present. Pt currently very limited secondary to weakness, fatigue and cognitive deficits (see below). Pt would continue to benefit from skilled physical therapy services at this time while admitted and after d/c to address the below listed limitations in order to improve overall safety and independence with functional mobility.  Pt on continuous EEG throughout.    Follow Up Recommendations SNF    Equipment Recommendations  None recommended by PT    Recommendations for Other Services       Precautions / Restrictions Precautions Precautions: Fall;Other (comment)(seizure) Restrictions Weight Bearing Restrictions: No      Mobility  Bed Mobility Overal bed mobility: Needs Assistance Bed Mobility: Rolling;Supine to Sit;Sit to Supine Rolling: Total assist;+2 for physical assistance   Supine to sit: Total assist;+2 for physical assistance Sit to supine: Total assist;+2 for physical assistance   General bed mobility comments: increased time and effort, total A x2 for all aspects with use of bed pads to position pt's hips at EOB  Transfers                 General transfer comment: deferred  Ambulation/Gait                Stairs            Wheelchair Mobility    Modified Rankin (Stroke Patients Only)        Balance Overall balance assessment: Needs assistance Sitting-balance support: Feet supported;Bilateral upper extremity supported Sitting balance-Leahy Scale: Poor Sitting balance - Comments: progressing from min-mod A to close min guard for brief periods of time Postural control: Right lateral lean                                   Pertinent Vitals/Pain Pain Assessment: Faces Faces Pain Scale: Hurts even more Pain Location: generalized with mobility Pain Descriptors / Indicators: Grimacing Pain Intervention(s): Monitored during session;Repositioned    Home Living Family/patient expects to be discharged to:: Unsure                 Additional Comments: pt with expressive difficulties throughout and unable to provide any reliable history information. No family or caregivers present either.    Prior Function           Comments: unsure - pt with expressive difficulties throughout and unable to provide any reliable history information. No family or caregivers present either.     Hand Dominance        Extremity/Trunk Assessment   Upper Extremity Assessment Upper Extremity Assessment: Defer to OT evaluation    Lower Extremity Assessment Lower Extremity Assessment: Generalized weakness       Communication   Communication: Receptive difficulties;Expressive difficulties  Cognition Arousal/Alertness: Awake/alert Behavior During Therapy: Flat affect Overall Cognitive Status: Impaired/Different from baseline Area of Impairment: Orientation;Attention;Memory;Following commands;Safety/judgement;Awareness;Problem solving  Orientation Level: Disoriented to;Place;Time;Situation Current Attention Level: Focused Memory: Decreased short-term memory Following Commands: Follows one step commands inconsistently Safety/Judgement: Decreased awareness of deficits;Decreased awareness of safety Awareness: Intellectual Problem Solving: Slow  processing;Decreased initiation;Difficulty sequencing;Requires verbal cues;Requires tactile cues        General Comments      Exercises     Assessment/Plan    PT Assessment Patient needs continued PT services  PT Problem List Decreased strength;Decreased range of motion;Decreased activity tolerance;Decreased balance;Decreased coordination;Decreased mobility;Decreased cognition;Decreased knowledge of use of DME;Decreased safety awareness;Decreased knowledge of precautions;Pain       PT Treatment Interventions DME instruction;Gait training;Stair training;Functional mobility training;Balance training;Therapeutic exercise;Therapeutic activities;Neuromuscular re-education;Cognitive remediation;Patient/family education    PT Goals (Current goals can be found in the Care Plan section)  Acute Rehab PT Goals Patient Stated Goal: unable to state PT Goal Formulation: Patient unable to participate in goal setting Time For Goal Achievement: 09/11/18 Potential to Achieve Goals: Fair    Frequency Min 2X/week   Barriers to discharge        Co-evaluation PT/OT/SLP Co-Evaluation/Treatment: Yes Reason for Co-Treatment: Complexity of the patient's impairments (multi-system involvement);Necessary to address cognition/behavior during functional activity;For patient/therapist safety;To address functional/ADL transfers PT goals addressed during session: Mobility/safety with mobility;Balance;Strengthening/ROM         AM-PAC PT "6 Clicks" Mobility  Outcome Measure Help needed turning from your back to your side while in a flat bed without using bedrails?: Total Help needed moving from lying on your back to sitting on the side of a flat bed without using bedrails?: Total Help needed moving to and from a bed to a chair (including a wheelchair)?: Total Help needed standing up from a chair using your arms (e.g., wheelchair or bedside chair)?: Total Help needed to walk in hospital room?: Total Help  needed climbing 3-5 steps with a railing? : Total 6 Click Score: 6    End of Session   Activity Tolerance: Patient limited by fatigue;Patient limited by pain Patient left: in bed;with call bell/phone within reach;with bed alarm set;Other (comment)(continuous EEG) Nurse Communication: Mobility status PT Visit Diagnosis: Other abnormalities of gait and mobility (R26.89);Muscle weakness (generalized) (M62.81)    Time: 5427-0623 PT Time Calculation (min) (ACUTE ONLY): 24 min   Charges:   PT Evaluation $PT Eval Moderate Complexity: 1 Mod          Sherie Don, PT, DPT  Acute Rehabilitation Services Pager [phone] Office [place] 08/28/2018, 11:55 AM

## 2018-08-28 NOTE — Consult Note (Signed)
Physical Medicine and Rehabilitation Consult   Reason for Consult: Functional deficits due to encephalopathy of unknown cause.  Referring Physician: Dr. Rory Percy   HPI: Valerie Bell is a 53 y.o. female with history of cutaneous lupus, collagen vascular disease, MGUS, DVT 05/2018 who was admitted to [facility] on 08/23/18 after found by her roommate to be nonverbal and unable to get out of her car.  UDS positive for cannabinoids. MRI brain done revealing no acute process but moderate parenchymal brain volume loss for age. RPR/HIV negative. CTA head/neck was negative for stenosis or thromboses.  She was started on IV steroids due to concerns of lupus cerebritis and showed improvement in verbal output. Dr. Kernodle/Rheumatology consulted for input and reported that patient has had history of lupus anticoagulant with aortic mural thrombus, progressive BLE weakness X 8 months with work up at [facility] revealing  cardioembolic stroke and thoracic spinal mass that had resolved--patient then lost  He recommended transfer to [facility] for further work up of encephalopathy --EEG/LP as well as decision to start cytoxan.     She was transferred to [facility] 3/10 and MRI brain repeated showing two punctate foci of reduced diffusion in corpus callosum and let posterior temporal cortex. She was placed on LT-EEG yesterday that revealed generalized slowing that could be due to moderate diffuse metabolic encephalopathy.   She has had significant improvement with 5/5 doses of IV solumedrol and neurology recommends following up with Rheum for input on cytoxan. Therapy evaluations completed revealing expressive/receptive deficits and lethargy impacting mobility and ADLs.  ST/MD recommending CIR.    Review of Systems  Unable to perform ROS: Mental acuity      Past Medical History:  Diagnosis Date   Anxiety    Collagen vascular disease ([place])    DVT (deep venous thrombosis) ([place])    Hypertension    Lupus (systemic lupus  erythematosus) ([place])    Spinal cord lesion ([facility])     Past Surgical History:  Procedure Laterality Date   ABDOMINAL HYSTERECTOMY     BREAST BIOPSY Right 17+ years ago   negative Core Bx    Family History  Problem Relation Age of Onset   Arthritis Mother    Hypertension Mother    Breast cancer Neg Hx     Social History:  reports that she quit smoking about 14 years ago. Her smoking use included cigarettes. She has a 1.20 pack-year smoking history. She has never used smokeless tobacco. She reports current drug use. Drug: Marijuana. She reports that she does not drink alcohol.    Allergies  Allergen Reactions   Ace Inhibitors Other (See Comments)    Angioedema of face    Medications Prior to Admission  Medication Sig Dispense Refill   atorvastatin (LIPITOR) 80 MG tablet Take 80 mg by mouth daily.     hydrALAZINE (APRESOLINE) 25 MG tablet Take 25 mg by mouth 3 (three) times daily.     hydrocortisone ([employer] CORTISONE MAXIMUM STRENGTH) 1 % ointment Apply 1 application topically 2 (two) times daily.     hydroxychloroquine (PLAQUENIL) 200 MG tablet Take 200 mg by mouth daily.     losartan (COZAAR) 100 MG tablet Take 100 mg by mouth daily.     Rivaroxaban (XARELTO STARTER PACK PO) Take 15-20 mg by mouth.      spironolactone (ALDACTONE) 25 MG tablet Take 25 mg by mouth daily.     heparin 25000-0.45 UT/250ML-% infusion Inject 700 Units/hr into the vein continuous.  methylPREDNISolone sodium succinate 1,000 mg in sodium chloride 0.9 % 50 mL Inject 1,000 mg into the vein daily.      Home: Home Living Family/patient expects to be discharged to:: Unsure Available Help at Discharge: [place], Family, Friend(s) Type of Home: [place] Additional Comments: pt with expressive difficulties throughout and unable to provide any reliable history information. No family or caregivers present either.  Functional History: Prior Function Comments:  unsure - pt with expressive difficulties throughout and unable to provide any reliable history information. No family or caregivers present either. Functional Status:  Mobility: Bed Mobility Overal bed mobility: Needs Assistance Bed Mobility: Rolling, Supine to Sit, Sit to Supine Rolling: Total assist, +2 for physical assistance Supine to sit: Total assist, +2 for physical assistance Sit to supine: Total assist, +2 for physical assistance General bed mobility comments: increased time and effort, total A x2 for all aspects with use of bed pads to position pt's hips at EOB Transfers General transfer comment: deferred      ADL: ADL Overall ADL's : Needs assistance/impaired Eating/Feeding: Maximal assistance, Bed level, Sitting, Total assistance Grooming: Maximal assistance, Sitting, Bed level, Total assistance Upper Body Bathing: Maximal assistance, Total assistance Lower Body Bathing: Total assistance, +2 for physical assistance Upper Body Dressing : Maximal assistance, Total assistance Lower Body Dressing: Total assistance, +2 for physical assistance General ADL Comments: Pt completed bed mobility with +2 total assist. Sat EOB close min guard. Difficulty completing grooming tasks (cognition, coordination, and strength)  Cognition: Cognition Overall Cognitive Status: No family/caregiver present to determine baseline cognitive functioning Arousal/Alertness: Awake/alert Orientation Level: Oriented to person, Oriented to place Attention: Focused, Sustained, Alternating Focused Attention: Appears intact Sustained Attention: Appears intact Alternating Attention: Impaired Alternating Attention Impairment: Verbal complex Memory: Appears intact Awareness: Impaired Problem Solving: Impaired Problem Solving Impairment: Functional complex, Functional basic Executive Function: Self Monitoring, Organizing, Sequencing Sequencing: Impaired Sequencing Impairment: Functional  complex Organizing: Impaired Organizing Impairment: Functional complex Self Monitoring: Impaired Self Monitoring Impairment: Verbal basic, Functional complex, Verbal complex Cognition Arousal/Alertness: Awake/alert(more alert once sitting EOB) Behavior During Therapy: Flat affect Overall Cognitive Status: No family/caregiver present to determine baseline cognitive functioning Area of Impairment: Orientation, Attention, Memory, Following commands, Safety/judgement, Awareness, Problem solving Orientation Level: Disoriented to, Place, Time, Situation Current Attention Level: Focused Memory: Decreased short-term memory Following Commands: Follows one step commands inconsistently Safety/Judgement: Decreased awareness of deficits, Decreased awareness of safety Awareness: Intellectual Problem Solving: Slow processing, Decreased initiation, Difficulty sequencing, Requires verbal cues, Requires tactile cues Difficult to assess due to: Impaired communication   Blood pressure (!) 146/80, pulse 73, temperature (!) 97.3 F (36.3 C), temperature source Oral, resp. rate 18, height 5\' 3"  (1.6 m), weight 48.4 kg, last menstrual period 12/29/2001, SpO2 100 %. Physical Exam  Nursing note and vitals reviewed. Constitutional: She appears well-developed and well-nourished.  HENT:  Head: Normocephalic and atraumatic.  Eyes: Pupils are equal, round, and reactive to light. Conjunctivae and EOM are normal. No scleral icterus.  Neck: Normal range of motion. Neck supple.  Cardiovascular: Normal rate, regular rhythm and normal heart sounds.  No murmur heard. Respiratory: Effort normal and breath sounds normal. No stridor.  GI: Soft. Bowel sounds are normal. She exhibits no distension. There is no abdominal tenderness.  Neurological: She is alert.  Flat affect with minimal eye contact. Unable to state full name "Valerie Bell". Unable to choose place with choice of two. RUE flexed and unable to extend due to resistance.   Strength is to minus in  the right deltoid bicep tricep finger flexors and extensors 4- at the left deltoid bicep tricep grip to minus at the bilateral hip flexors knee extensors ankle dorsiflexors and plantar flexors Sensation difficult to assess but does withdraw to pinch in bilateral upper and lower extremities  Skin: Skin is warm and dry.  Psychiatric: Her affect is blunt. Her speech is delayed. She is withdrawn. Cognition and memory are impaired.  Oriented to self not place or time    Results for orders placed or performed during the hospital encounter of 08/27/18 (from the past 24 hour(s))  Heparin level (unfractionated)     Status: Abnormal   Collection Time: 08/29/18  4:15 AM  Result Value Ref Range   Heparin Unfractionated 0.22 (L) 0.30 - 0.70 IU/mL  CBC     Status: Abnormal   Collection Time: 08/29/18  4:15 AM  Result Value Ref Range   WBC 13.5 (H) 4.0 - 10.5 K/uL   RBC 3.50 (L) 3.87 - 5.11 MIL/uL   Hemoglobin 11.2 (L) 12.0 - 15.0 g/dL   HCT 33.3 (L) 36.0 - 46.0 %   MCV 95.1 80.0 - 100.0 fL   MCH 32.0 26.0 - 34.0 pg   MCHC 33.6 30.0 - 36.0 g/dL   RDW 12.4 11.5 - 15.5 %   Platelets 116 (L) 150 - 400 K/uL   nRBC 0.0 0.0 - 0.2 %  Heparin level (unfractionated)     Status: None   Collection Time: 08/29/18 12:28 PM  Result Value Ref Range   Heparin Unfractionated 0.36 0.30 - 0.70 IU/mL   Mr Jeri Cos Wo Contrast  Result Date: 08/27/2018 CLINICAL DATA:  53 y/o F; history of systemic lupus erythematosus (SLE), 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 EXAM: MRI HEAD WITHOUT AND WITH CONTRAST TECHNIQUE: Multiplanar, multiecho pulse sequences of the brain and surrounding structures were obtained without and with intravenous contrast. CONTRAST:  5 cc Gadavist COMPARISON:  08/24/2018 CTA of the head. 08/23/2018 CT head and MRI head. FINDINGS: Brain: Motion degradation of several sequences. 2 punctate foci of reduced diffusion are present within the body of corpus callosum and left  posterior temporal cortex (series 5 image 70 and 80). Few stable nonspecific T2 hyperintensities are present in periventricular white matter and predominantly the frontal lobes. Moderate diffuse age advanced volume loss of the brain with relative cerebellar sparing. No extra-axial collection, hemorrhage, hydrocephalus, mass effect, or herniation. After administration of intravenous contrast there is no abnormal enhancement. Vascular: Normal flow voids. Skull and upper cervical spine: Normal marrow signal. Sinuses/Orbits: Mild-to-moderate mucosal thickening the left-sided ethmoid air cells, sphenoid sinus, the maxillary sinus. No significant abnormal signal of the mastoid air cells. Orbits are unremarkable. Other: None. IMPRESSION: Motion degradation of several sequences. 1. Two punctate foci of reduced diffusion are present in the body of corpus callosum and left posterior temporal cortex. Findings probably represent recent microvascular ischemia of uncertain etiology. No secondary findings of vasculitis, meningitis, or encephalitis. 2. Stable moderate age advanced volume loss of the brain. 3. Left-sided paranasal sinus disease. Electronically Signed   By: Kristine Garbe M.D.   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  The patient has been treated with high-dose steroids for presumptive lupus cerebritis although MRI not indicative. 1. Does the need for close, 24 hr/day medical supervision in concert with the patient's rehab needs make it unreasonable for this patient to be served in a less intensive setting? Potentially 2. Co-Morbidities requiring supervision/potential  complications: 3 of DVT just restarted on Xarelto, collagen vascular disease, hypertension 3. Due to bladder management, bowel management, safety, skin/wound care, disease management, medication administration, pain management and patient education, does the patient  require 24 hr/day rehab nursing? Potentially 4. Does the patient require coordinated care of a physician, rehab nurse, PT (1-2 hrs/day, 5 days/week), OT (1-2 hrs/day, 5 days/week) and SLP (.5-1 hrs/day, 5 days/week) to address physical and functional deficits in the context of the above medical diagnosis(es)? Potentially Addressing deficits in the following areas: balance, endurance, locomotion, strength, transferring, bowel/bladder control, bathing, dressing, feeding, grooming, toileting, cognition, speech, language, swallowing and psychosocial support 5. Can the patient actively participate in an intensive therapy program of at least 3 hrs of therapy per day at least 5 days per week? Potentially 6. The potential for patient to make measurable gains while on inpatient rehab is fair 7. Anticipated functional outcomes upon discharge from inpatient rehab are mod assist  with PT, mod assist with OT, mod assist with SLP. 8. Estimated rehab length of stay to reach the above functional goals is: NA 9. Anticipated D/C setting: Home 10. Anticipated post D/C treatments: [place] therapy 11. Overall Rehab/Functional Prognosis: fair  RECOMMENDATIONS: This patient's condition is appropriate for continued rehabilitative care in the following setting: CIR once able to participate consistently in rehab program.  Needs to be up in chair 3 hours/day Patient has agreed to participate in recommended program. N/A Note that insurance prior authorization may be required for reimbursement for recommended care.  Comment: Family input on baseline cognition would be very helpful  "I have personally performed a face to face diagnostic evaluation of this patient.  Additionally, I have reviewed and concur with the physician assistant's documentation above." Charlett Blake M.D. [facility] FAAPM&R (Sports Med, Neuromuscular Med) Diplomate Am Board of [place], PA-C 08/29/2018

## 2018-08-28 NOTE — Progress Notes (Signed)
LTM EEG set up - no initial skin breakdown

## 2018-08-28 NOTE — Procedures (Signed)
ELECTROENCEPHALOGRAM REPORT   Patient: Valerie Bell       Room #: 5M84X EEG No. ID: [id number] Age: 53 y.o.        Sex: female Referring Physician: Ghimire Report Date:  08/28/2018        Interpreting Physician: Alexis Goodell  History: [name] is an 53 y.o. female with altered mental status  Medications:  Heparin, Solumedrol  Conditions of Recording:  This is a 21 channel routine scalp EEG performed with bipolar and monopolar montages arranged in accordance to the international 10/20 system of electrode placement. One channel was dedicated to EKG recording.  The patient is in the altered state.  Description:  The patient is not cooperative for the majority of the recording therefore muscle and movement artifact dominate the record.  The patient does relax during brief periods when the background is able to be evaluated.  During these periods the background is slow and dominated by polymorphic delta activity that is diffusely distributed.  Intermixed theta activity is noted as well.  This activity at times resembles drowse. No epileptiform activity is noted.  Hyperventilation and intermittent photic stimulation were not performed.  IMPRESSION: This is a technically difficult record due to the predominance of muscle and movement artifact.  When able to be evaluated this record is dominated by general background slowing.  This finding may be seen with a diffuse disturbance that is etiologically nonspecific, but may include a metabolic encephalopathy, among other possibilities.  No epileptiform activity was noted.     Alexis Goodell, MD Neurology [phone] 08/28/2018, 12:31 PM

## 2018-08-28 NOTE — Progress Notes (Signed)
EEG complete - results pending 

## 2018-08-28 NOTE — Evaluation (Signed)
Occupational Therapy Evaluation Patient Details Name: Valerie Bell MRN: [id number] DOB: 12/26/1965 Today's Date: 08/28/2018    History of Present Illness Pt is a 53 y/o female admitted from [facility] secondary to [place]. MRI was negative for any acute findings. Work-up pending. PMH including but not limited to collagen vascular disease, Lupus, anxiety, spinal cord lesion.   Clinical Impression   Pt admitted with the above diagnoses and presents with below problem list. Pt will benefit from continued acute OT to address the below listed deficits and maximize independence with basic ADLs prior to d/c to venue below. Unclear what PLOF with ADLs was or pt's home setup. Pt with expressive/receptive communication deficits and no family present. Pt lethargic upon arrival, more alert once sitting EOB. +2 total A for bed mobility but was able to progress to sitting EOB a few minutes at min guard level. Needs max to total A with all ADLs at this time.     Follow Up Recommendations  SNF    Equipment Recommendations  Other (comment)(defer to next venue)    Recommendations for Other Services       Precautions / Restrictions Precautions Precautions: Fall;Other (comment)(seizure) Restrictions Weight Bearing Restrictions: No      Mobility Bed Mobility Overal bed mobility: Needs Assistance Bed Mobility: Rolling;Supine to Sit;Sit to Supine Rolling: Total assist;+2 for physical assistance   Supine to sit: Total assist;+2 for physical assistance Sit to supine: Total assist;+2 for physical assistance   General bed mobility comments: increased time and effort, total A x2 for all aspects with use of bed pads to position pt's hips at EOB  Transfers                 General transfer comment: deferred    Balance Overall balance assessment: Needs assistance Sitting-balance support: Feet supported;Bilateral upper extremity supported Sitting balance-Leahy Scale: Poor Sitting balance - Comments:  progressing from min-mod A to close min guard for brief periods of time Postural control: Right lateral lean                                 ADL either performed or assessed with clinical judgement   ADL Overall ADL's : Needs assistance/impaired Eating/Feeding: Maximal assistance;Bed level;Sitting;Total assistance   Grooming: Maximal assistance;Sitting;Bed level;Total assistance   Upper Body Bathing: Maximal assistance;Total assistance   Lower Body Bathing: Total assistance;+2 for physical assistance   Upper Body Dressing : Maximal assistance;Total assistance   Lower Body Dressing: Total assistance;+2 for physical assistance                 General ADL Comments: Pt completed bed mobility with +2 total assist. Sat EOB close min guard. Difficulty completing grooming tasks (cognition, coordination, and strength)     Vision         Perception     Praxis      Pertinent Vitals/Pain Pain Assessment: Faces Faces Pain Scale: Hurts even more Pain Location: generalized with mobility Pain Descriptors / Indicators: Grimacing Pain Intervention(s): Monitored during session;Repositioned     Hand Dominance     Extremity/Trunk Assessment Upper Extremity Assessment Upper Extremity Assessment: RUE deficits/detail;LUE deficits/detail RUE Deficits / Details: keeps in flexion pattern, able to partially move into extension this session RUE Coordination: decreased fine motor;decreased gross motor LUE Deficits / Details: LUE tending towards flexion pattern but able to move out of this with cueing and some AAROM LUE Coordination: decreased gross motor;decreased fine  motor   Lower Extremity Assessment Lower Extremity Assessment: Defer to PT evaluation       Communication Communication Communication: Receptive difficulties;Expressive difficulties   Cognition Arousal/Alertness: Awake/alert(more alert once sitting EOB) Behavior During Therapy: Flat affect Overall  Cognitive Status: Difficult to assess Area of Impairment: Orientation;Attention;Memory;Following commands;Safety/judgement;Awareness;Problem solving                 Orientation Level: Disoriented to;Place;Time;Situation Current Attention Level: Focused Memory: Decreased short-term memory Following Commands: Follows one step commands inconsistently Safety/Judgement: Decreased awareness of deficits;Decreased awareness of safety Awareness: Intellectual Problem Solving: Slow processing;Decreased initiation;Difficulty sequencing;Requires verbal cues;Requires tactile cues     General Comments  pt on continuous EEG.     Exercises     Shoulder Instructions      Home Living Family/patient expects to be discharged to:: Unsure                                 Additional Comments: pt with expressive difficulties throughout and unable to provide any reliable history information. No family or caregivers present either.      Prior Functioning/Environment          Comments: unsure - pt with expressive difficulties throughout and unable to provide any reliable history information. No family or caregivers present either.        OT Problem List: Decreased activity tolerance;Impaired balance (sitting and/or standing);Decreased cognition;Decreased coordination;Decreased strength;Decreased safety awareness;Decreased knowledge of use of DME or AE;Decreased knowledge of precautions;Impaired tone;Impaired UE functional use;Pain      OT Treatment/Interventions: Self-care/ADL training;Neuromuscular education;Therapeutic exercise;DME and/or AE instruction;Therapeutic activities;Cognitive remediation/compensation;Patient/family education;Balance training    OT Goals(Current goals can be found in the care plan section) Acute Rehab OT Goals Patient Stated Goal: unable to state OT Goal Formulation: Patient unable to participate in goal setting Time For Goal Achievement:  09/11/18 Potential to Achieve Goals: Good ADL Goals Pt Will Perform Eating: with min assist;sitting Pt Will Perform Grooming: with mod assist;sitting Pt Will Transfer to Toilet: with min assist;ambulating Additional ADL Goal #1: Pt will complete bed mobility at mod A level to prepare for EOB/OOB ADLs.  OT Frequency: Min 2X/week   Barriers to D/C:            Co-evaluation PT/OT/SLP Co-Evaluation/Treatment: Yes Reason for Co-Treatment: Complexity of the patient's impairments (multi-system involvement);Necessary to address cognition/behavior during functional activity;For patient/therapist safety;To address functional/ADL transfers PT goals addressed during session: Mobility/safety with mobility;Balance;Strengthening/ROM OT goals addressed during session: ADL's and self-care;Strengthening/ROM      AM-PAC OT "6 Clicks" Daily Activity     Outcome Measure Help from another person eating meals?: Total Help from another person taking care of personal grooming?: Total Help from another person toileting, which includes using toliet, bedpan, or urinal?: Total Help from another person bathing (including washing, rinsing, drying)?: Total Help from another person to put on and taking off regular upper body clothing?: Total Help from another person to put on and taking off regular lower body clothing?: Total 6 Click Score: 6   End of Session Nurse Communication: Mobility status  Activity Tolerance: Patient limited by fatigue;Patient tolerated treatment well Patient left: in bed;with call bell/phone within reach;with bed alarm set  OT Visit Diagnosis: Other abnormalities of gait and mobility (R26.89);Muscle weakness (generalized) (M62.81);Apraxia (R48.2);Other symptoms and signs involving cognitive function;Other symptoms and signs involving the nervous system (R29.898);Cognitive communication deficit (R41.841);Pain  Time: 0383-3383 OT Time Calculation (min): 23 min Charges:  OT  General Charges $OT Visit: 1 Visit OT Evaluation $OT Eval Moderate Complexity: [place], OT Acute Rehabilitation Services Pager: [phone] Office: [phone]   Hortencia Pilar 08/28/2018, 1:44 PM

## 2018-08-28 NOTE — Progress Notes (Addendum)
NEUROLOGY PROGRESS NOTE  Subjective: Patient is awake alert, has no complaints.  Exam: Vitals:   08/28/18 0500 08/28/18 0731  BP: (!) 156/85 (!) 154/76  Pulse: 72 (!) 54  Resp: 16 18  Temp: 98.6 F (37 C) 98 F (36.7 C)  SpO2: 99% 99%    Physical Exam  General: NAD HEENT-  [place]/AT. Skin hyperpigmentation.  Neuro:  Mental Status: Patient is awake and alert today. She is oriented to hospital but cannot state which hospital.  She is able to name a thumb and pinky. She is aware that it is 2020 but cannot give the month.  She is able to repeat words.  She is able to do simple commands.  Thus she is more awake and alert today and able to do more as far as mental status today.  She is distracted by the TV.  When talking patient does not move her mouth fully thus at times is difficult to understand what she is saying. Cranial Nerves: II:  Visual fields grossly normal,  III,IV, VI: ptosis not present, extra-ocular motions intact bilaterally pupils equal, round, reactive to light  V,VII: smile symmetric, facial light touch sensation normal bilaterally VIII: hearing normal bilaterally IX,X: uvula rises midline XI: bilateral shoulder shrug XII: midline tongue extension Motor: Left upper extremity full-strength and antigravity 5/5.  Right upper extremity has increased tone; keeps it flexed; is able to stretch it out passively but does not move much.  Both legs unable to stay antigravity. Sensory: Grimaces to noxious stimulation equally.  Withdraws both lower extremities slightly to noxious stimulation Deep Tendon Reflexes: 3+ and symmetric arms and knee jerks; however She does not have clonus. Ankle jerks are 2+   Medications:  Scheduled:  Continuous: . heparin 500 Units/hr (08/28/18 0600)  . methylPREDNISolone (SOLU-MEDROL) IVPB for doses > 1000 mg      Pertinent Labs/Diagnostics: -Urinalysis shows amber, cloudy, large amounts of hemoglobin, proteinuria at 30, large amount of  leukocytes, few bacteria and greater than 50 red blood cells. -CBC shows a white blood cell count of 14.3 and platelets of 115  Mr Jeri Cos Wo Contrast Result Date: 08/27/2018  IMPRESSION: Motion degradation of several sequences. 1. Two punctate foci of reduced diffusion are present in the body of corpus callosum and left posterior temporal cortex. Findings probably represent recent microvascular ischemia of uncertain etiology. No secondary findings of vasculitis, meningitis, or encephalitis. 2. Stable moderate age advanced volume loss of the brain. 3. Left-sided paranasal sinus disease. Electronically Signed   By: Kristine Garbe M.D.   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 -- She has been treated with IV Solu-Medrol-has received 4/5 doses and will complete last dose today -- Rheumatology has also seen the patient and recommended a spinal tap as well as continuous EEG monitoring along with complement studies and UA to look for proteinuria. -- At this time, given her focal exam with right-sided weakness, unclear if it is baseline or not. Would repeat an MRI of the brain-also would use contrast to look for any signs of meningeal enhancement or parenchymal enhancement, which might support diagnosis of lupus cerebritis.  Other possible differentials could include neuropsychiatric manifestations of systemic lupus erythematosus which can present with altered level of consciousness/acute confusional state/and or seizures. -- At this point, would not emergently do  a spinal tap since she has already received multiple days of high dose steroids. -- Spot EEG from today: This is a technically difficult record due to the predominance of muscle and movement artifact.  When able to be  evaluated this record is dominated by general background slowing.  This finding may be seen with a diffuse disturbance that is etiologically nonspecific, but may include a metabolic encephalopathy, among other possibilities.  No epileptiform activity was noted.    Recommendations: -- LTM EEG is being started -- Continue with the fifth dose of IV steroid- will be given today -- Decision for Cytoxan deferred till further imaging and further lab testing is made available. Will also need Rheumatology input here at [facility] - Repeat MRI performed yesterday night shows no definite change relative to the prior study performed on 3/7. Of note, the prior study was of lower resolution, with a few more chronic white matter lesions visible on the current, higher resolution examination. Per the report, two punctate foci of reduced diffusion are present in the body of corpus callosum and left posterior temporal cortex. Findings probably represent recent microvascular ischemia of uncertain etiology. No secondary findings of vasculitis, meningitis, or encephalitis. Stable moderate age advanced volume loss of the brain is also noted.  Electronically signed: Dr. Kerney Elbe 08/28/2018, 8:59 AM

## 2018-08-29 DIAGNOSIS — G0490 Encephalitis and encephalomyelitis, unspecified: Secondary | ICD-10-CM

## 2018-08-29 DIAGNOSIS — I639 Cerebral infarction, unspecified: Secondary | ICD-10-CM

## 2018-08-29 DIAGNOSIS — D72829 Elevated white blood cell count, unspecified: Secondary | ICD-10-CM

## 2018-08-29 LAB — CBC
HCT: 33.3 % — ABNORMAL LOW (ref 36.0–46.0)
Hemoglobin: 11.2 g/dL — ABNORMAL LOW (ref 12.0–15.0)
MCH: 32 pg (ref 26.0–34.0)
MCHC: 33.6 g/dL (ref 30.0–36.0)
MCV: 95.1 fL (ref 80.0–100.0)
Platelets: 116 10*3/uL — ABNORMAL LOW (ref 150–400)
RBC: 3.5 MIL/uL — ABNORMAL LOW (ref 3.87–5.11)
RDW: 12.4 % (ref 11.5–15.5)
WBC: 13.5 10*3/uL — ABNORMAL HIGH (ref 4.0–10.5)
nRBC: 0 % (ref 0.0–0.2)

## 2018-08-29 LAB — COMPLEMENT, TOTAL: Compl, Total (CH50): >60 U/mL (ref >41)

## 2018-08-29 LAB — HEPARIN LEVEL (UNFRACTIONATED)
Heparin Unfractionated: 0.22 IU/mL — ABNORMAL LOW (ref 0.30–0.70)
Heparin Unfractionated: 0.36 IU/mL (ref 0.30–0.70)

## 2018-08-29 NOTE — Procedures (Signed)
CPT/Type of Study: 40768; 24hr EEG with video Recording Date: 08/28/2018 11:23 - 08/29/2018 07:30 Interpreting physician: Izora Ribas, DO  History: This is a 53 year old patient with altered mental status, undergoing an EEG to evaluate for seizures.   Technical Description: The EEG was performed using standard setting per the guidelines of American Clinical Neurophysiology Society (ACNS).   A minimum of 21 electrodes were placed on scalp according to the International 10-20 or/and 10-10 Systems. Supplemental electrodes were placed as needed. Single EKG electrode was also used to detect cardiac arrhythmia. Patient's behavior was continuously recorded on video simultaneously with EEG. A minimum of 16 channels were used for data display. Each epoch of study was reviewed manually daily and as needed using standard referential and bipolar montages. Computerized quantitative EEG analysis (such as compressed spectral array analysis, trending, automated spike & seizure detection) were used as indicated.   Clinical State: Awake and asleep Background: The posterior dominant rhythm was recorded up to 7Hz  Overall Amplitude: Normal Predominant Frequency: Delta and theta rhythms Asymmetry: No Sleep background: Stage I  Abnormalities: Continuous slow generalized Rhythmic or periodic pattern: Generalized rhythmic delta  Epileptiform activity: No Electrographic Seizure: No Events: No  Breach rhythm: No Reactivity: Yes  Stimulation procedures:  Hyperventilation: Not done Photic stimulation: Not done  Impression: This EEG shows evidence of a moderate diffuse encephalopathy. No epileptiform discharges or EEG seizures were recorded.

## 2018-08-29 NOTE — Evaluation (Signed)
Speech Language Pathology Evaluation Patient Details Name: Valerie Bell MRN: [id number] DOB: 1965-10-23 Today's Date: 08/29/2018 Time: 4627-0350 SLP Time Calculation (min) (ACUTE ONLY): 25 min  Problem List:  Patient Active Problem List   Diagnosis Date Noted  . Acute encephalopathy 08/27/2018  . Encephalitis 08/27/2018  . Benign essential HTN 08/27/2018  . Leucocytosis 08/27/2018  . Lupus ([place]) 08/27/2018  . DVT (deep vein thrombosis) in pregnancy 08/27/2018  . Lethargy 08/24/2018  . Chest pain 03/30/2017   Past Medical History:  Past Medical History:  Diagnosis Date  . Anxiety   . Collagen vascular disease ([place])   . DVT (deep venous thrombosis) ([place])   . Hypertension   . Lupus (systemic lupus erythematosus) ([place])   . Spinal cord lesion [facility])    Past Surgical History:  Past Surgical History:  Procedure Laterality Date  . ABDOMINAL HYSTERECTOMY    . BREAST BIOPSY Right 17+ years ago   negative Core Bx   HPI:   Valerie Bell is a 53 y.o. female with documented history of anxiety, collagen vascular disease, DVT, hypertension, lupus, presenting to the hospital as a transfer from [facility], where she was initially seen by neurology for altered mental status. According to the initial neurology consult dictated by Dr. Orlena Sheldon, the patient has had lupus for 2 years and was brought into [facility] because she was having difficulty walking and had episode of staring and unresponsiveness during which time she was also nonverbal.  There was no reported head injury.  MRI of the brain was performed which was unremarkable. At [facility], patient was started on IV Solu-Medrol for presumptive lupus cerebritis and has completed 4 days of IV Solu-Medrol and per progress notes from Dr. Orlena Sheldon and Dr. Doy Mince, is doing better than before.  It is unclear what her baseline is at this time. Pt seen by SLP at [facility] on 08/27/18, found to have  rigidity but automatic response to tactile cues with PO, no signs of aspiration, started on dys 2/thin liquids.    Assessment / Plan / Recommendation Clinical Impression  Pt presents with cognitive-linguistic impairment c/b dysarthria, expressive and receptive language deficits, and cognitive deficits in the areas of attention, problem solving, sequencing of functional tasks, and awareness of deficits. Vocal quality is strained and speech is dysarthric with a conversational intelligibility of 25%. Pt displayed difficulty following complex commands, answering complex yes/no questions, and describing picture scene. She is, however, able to answer simple biographical questions and follow simple 1-step commands with min-no cues. Pt disorganized and demonstrated poor problem-solving and sequencing abilities during meal set-up and intake. Sustained attention good during meal intake, but pt struggled to alternate attention between intake and completing picture description task. Recommend SLP services to target speech intelligibility, language and cognitive deficits in order for pt to better participate in ADLs. Pt would be a good candidate for CIR.    SLP Assessment  SLP Recommendation/Assessment: Patient needs continued Speech Lanaguage Pathology Services SLP Visit Diagnosis: Cognitive communication deficit (R41.841)    Follow Up Recommendations  Inpatient Rehab    Frequency and Duration min 2x/week         SLP Evaluation Cognition  Overall Cognitive Status: No family/caregiver present to determine baseline cognitive functioning Arousal/Alertness: Awake/alert Orientation Level: Oriented to person;Oriented to place Attention: Focused;Sustained;Alternating Focused Attention: Appears intact Sustained Attention: Appears intact Alternating Attention: Impaired Alternating Attention Impairment: Verbal complex Memory: Appears intact Awareness: Impaired Problem Solving: Impaired Problem Solving  Impairment: Functional complex;Functional  basic Executive Function: Self Monitoring;Organizing;Sequencing Sequencing: Impaired Sequencing Impairment: Functional complex Organizing: Impaired Organizing Impairment: Functional complex Self Monitoring: Impaired Self Monitoring Impairment: Verbal basic;Functional complex;Verbal complex       Comprehension  Auditory Comprehension Overall Auditory Comprehension: Impaired Yes/No Questions: Impaired Basic Biographical Questions: 76-100% accurate Basic Immediate Environment Questions: 75-100% accurate Complex Questions: 0-24% accurate Commands: Impaired Two Step Basic Commands: 0-24% accurate Complex Commands: 0-24% accurate Interfering Components: Attention Reading Comprehension Reading Status: Not tested    Expression Expression Primary Mode of Expression: Verbal Verbal Expression Initiation: No impairment Automatic Speech: Name;Social Response Level of Generative/Spontaneous Verbalization: Conversation Repetition: (NT) Naming: Impairment Confrontation: Impaired Interfering Components: Attention;Speech intelligibility Effective Techniques: Phonemic cues Written Expression Dominant Hand: Left Written Expression: Not tested   Oral / Motor  Oral Motor/Sensory Function Overall Oral Motor/Sensory Function: Within functional limits Motor Speech Overall Motor Speech: Impaired Phonation: Other (comment)(strangled/strained) Articulation: Impaired Level of Impairment: Word Intelligibility: Intelligibility reduced Word: 0-24% accurate Phrase: 0-24% accurate Sentence: 0-24% accurate Conversation: 0-24% accurate Motor Planning: Witnin functional limits   GO                    Ellis Savage, Student SLP 08/29/2018, 3:19 PM

## 2018-08-29 NOTE — Progress Notes (Signed)
Story for heparin Indication: DVT  Allergies  Allergen Reactions  . Ace Inhibitors Other (See Comments)    Angioedema of face    Patient Measurements: Height: 5\' 3"  (160 cm) Weight: 106 lb 11.2 oz (48.4 kg) IBW/kg (Calculated) : 52.4 Heparin Dosing Weight: 45.4 kg  Vital Signs: Temp: 97.3 F (36.3 C) (03/12 1204) Temp Source: Oral (03/12 1204) BP: 146/80 (03/12 1204) Pulse Rate: 73 (03/12 1204)  Labs: Recent Labs    08/27/18 0455 08/28/18 0631 08/29/18 0415 08/29/18 1228  HGB 11.7* 11.2* 11.2*  --   HCT 36.7 33.3* 33.3*  --   PLT 123* 115* 116*  --   HEPARINUNFRC 0.40 0.36 0.22* 0.36  CREATININE  --  0.58  --   --     Estimated Creatinine Clearance: 62.9 mL/min (by C-G formula based on SCr of 0.58 mg/dL).   Medical History: Past Medical History:  Diagnosis Date  . Anxiety   . Collagen vascular disease ([place])   . DVT (deep venous thrombosis) ([place])   . Hypertension   . Lupus (systemic lupus erythematosus) ([place])   . Spinal cord lesion [facility])     Assessment: 53 year old female on Xarelto 20 mg daily PTA. Last dose reported 3/5 at 0800. Pharmacy consulted for heparin dosing for VTE treatment.  Heparin level therapeutic at 0.36 s/p rate increase to 600 units/hr  Goal of Therapy:  Heparin level 0.3-0.7 units/ml Monitor platelets by anticoagulation protocol: Yes   Plan:  Continue heparin gtt at 600 units/hr Daily heparin level, CBC, s/s bleeding  Bertis Ruddy, PharmD Clinical Pharmacist Please check AMION for all [place] numbers 08/29/2018 1:24 PM

## 2018-08-29 NOTE — Progress Notes (Signed)
Patient's heparin gtt rate changed, no alaris association due to no internet connection. TEFL teacher trouble shoot. Internet connection re-established and heparin gtt continued at ordered rate without interruption and alaris, CHL and MAR all with correct rate.

## 2018-08-29 NOTE — Progress Notes (Signed)
PROGRESS NOTE    Valerie Bell  UVO:[id number] DOB: [birth year] DOA: 08/27/2018 PCP: [facility]    Brief Narrative:  Patient is a 53 year old female with history of anxiety, collagen vascular disease, DVT, hypertension and lupus who was admitted to [facility] with altered mental status.  Patient does have history of lupus disease with history of difficulty walking and episode of unresponsiveness that was diagnosed about 2 years ago.  Most of the work-up were negative at [facility].  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  She had some improvement of mentation.  Patient was followed by neurology and rheumatology at [facility].  She was transferred to [facility] for continuous EEG monitoring. Reviewing her chart, her mentation has somehow improved.   Assessment & Plan:   Principal Problem:   Encephalitis Active Problems:   Benign essential HTN   Leucocytosis   Lupus (HCC)   DVT (deep vein thrombosis) in pregnancy  Acute encephalitis: Right hemiparesis.  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  Patient already finished 5 days of high dose steroid therapy.  Spot EEG with generalized slowing.  Followed by neurology.  Getting continuous video EEG monitoring today.  Remains on symptomatic treatment.  Continue to work with PT OT.  Will refer to inpatient rehab. Neurology not recommending lumbar puncture. MRI of the brain showed 2 punctate foci of the reduced diffusion on corpus callosum and left posterior temporal cortex. Will need Outpatient referral to rheumatology on discharge. Neurology is not anticipating lumbar puncture and further procedure.  No Cytoxan recommended at this time by neurology.  DVT: Recently diagnosed DVT.  She used to be on Xarelto.  Patient is currently on heparin pending clinical improvement.  Will be discharged on Xarelto, anticipate changing to oral Xarelto tomorrow if she  remains without any events.  Hypertension: Fairly stable.  On as needed medications.   DVT prophylaxis: On heparin infusion Code Status: Full code Family Communication: No family at bedside Disposition Plan: Acute inpatient rehab Patient is medically stable to transfer to acute rehab level of care.  I updated the Education officer, museum.   Consultants:   Neurology  Procedures:   Continuous EEG  Antimicrobials:   None   Subjective: Patient was seen and examined at the bedside.  She was alert and awake, she will follow simple commands.  She was with flat affect.  No overnight events. We examined patient at the bedside with neurology.  She was able to follow commands.  Speaks simple sentences.  She was able to use her right hand and right leg with some hesitancy.  Objective: Vitals:   08/28/18 2004 08/28/18 2322 08/29/18 0300 08/29/18 1204  BP: (!) 169/96 (!) 170/89 (!) 163/85 (!) 146/80  Pulse: (!) 56 (!) 56 (!) 53 73  Resp: 18 16 15 18   Temp: 97.6 F (36.4 C) 98 F (36.7 C) 98.1 F (36.7 C) (!) 97.3 F (36.3 C)  TempSrc: Oral Oral Oral Oral  SpO2: 100% 99% 99% 100%  Weight:      Height:        Intake/Output Summary (Last 24 hours) at 08/29/2018 1405 Last data filed at 08/29/2018 7425 Gross per 24 hour  Intake 362.68 ml  Output --  Net 362.68 ml   Filed Weights   08/27/18 1650  Weight: 48.4 kg    Examination:  General exam: Appears calm and comfortable  Slightly anxious.  Not in any distress. Respiratory system: Clear to auscultation.  Respiratory effort normal. Cardiovascular system: S1 & S2 heard, RRR. No JVD, murmurs, rubs, gallops or clicks. No pedal edema. Gastrointestinal system: Abdomen is nondistended, soft and nontender. No organomegaly or masses felt. Normal bowel sounds heard. Central nervous system: Alert and awake.  She is oriented to place and person.  Not oriented to time. Extremities: Normal motor power and sensory on left side.  Right upper  extremity is flexed and somehow rigid.  Passively can be moved.  Her right leg is 4/5. Skin: No rashes, lesions or ulcers Psychiatry: Judgement and insight appear normal.  Flat affect with anxious mood.    Data Reviewed: I have personally reviewed following labs and imaging studies  CBC: Recent Labs  Lab 08/23/18 1821 08/25/18 1133 08/26/18 0222 08/27/18 0455 08/28/18 0631 08/29/18 0415  WBC 10.1 10.1 17.0* 14.5* 14.3* 13.5*  NEUTROABS 8.4*  --   --  13.0*  --   --   HGB 14.4 13.2 12.1 11.7* 11.2* 11.2*  HCT 44.4 39.5 36.8 36.7 33.3* 33.3*  MCV 96.7 95.4 97.4 97.9 94.6 95.1  PLT 161 140* 125* 123* 115* 254*   Basic Metabolic Panel: Recent Labs  Lab 08/23/18 1936 08/28/18 0631  NA 138 140  K 4.0 3.6  CL 102 109  CO2 27 23  GLUCOSE 93 102*  BUN 21* 22*  CREATININE 0.86 0.58  CALCIUM 9.2 9.3   GFR: Estimated Creatinine Clearance: 62.9 mL/min (by C-G formula based on SCr of 0.58 mg/dL). Liver Function Tests: Recent Labs  Lab 08/23/18 1936  AST 19  ALT 12  ALKPHOS 55  BILITOT 0.3  PROT 7.9  ALBUMIN 4.0   No results for input(s): LIPASE, AMYLASE in the last 168 hours. Recent Labs  Lab 08/23/18 2107  AMMONIA <9*   Coagulation Profile: Recent Labs  Lab 08/25/18 1133  INR 1.3*   Cardiac Enzymes: Recent Labs  Lab 08/23/18 1936  TROPONINI <0.03   BNP (last 3 results) No results for input(s): PROBNP in the last 8760 hours. HbA1C: No results for input(s): HGBA1C in the last 72 hours. CBG: Recent Labs  Lab 08/23/18 1800  GLUCAP 79   Lipid Profile: No results for input(s): CHOL, HDL, LDLCALC, TRIG, CHOLHDL, LDLDIRECT in the last 72 hours. Thyroid Function Tests: No results for input(s): TSH, T4TOTAL, FREET4, T3FREE, THYROIDAB in the last 72 hours. Anemia Panel: No results for input(s): VITAMINB12, FOLATE, FERRITIN, TIBC, IRON, RETICCTPCT in the last 72 hours. Sepsis Labs: No results for input(s): PROCALCITON, LATICACIDVEN in the last 168  hours.  No results found for this or any previous visit (from the past 240 hour(s)).       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 EXAM: MRI HEAD WITHOUT AND WITH CONTRAST TECHNIQUE: Multiplanar, multiecho pulse sequences of the brain and surrounding structures were obtained without and with intravenous contrast. CONTRAST:  5 cc Gadavist COMPARISON:  08/24/2018 CTA of the head. 08/23/2018 CT head and MRI head. FINDINGS: Brain: Motion degradation of several sequences. 2 punctate foci of reduced diffusion are present within the body of corpus callosum and left posterior temporal cortex (series 5 image 70 and 80). Few stable nonspecific T2 hyperintensities are present in periventricular white matter and predominantly the frontal lobes. Moderate diffuse age advanced volume loss of the brain with relative cerebellar sparing. No extra-axial collection, hemorrhage, hydrocephalus, mass effect, or herniation. After administration of intravenous contrast  there is no abnormal enhancement. Vascular: Normal flow voids. Skull and upper cervical spine: Normal marrow signal. Sinuses/Orbits: Mild-to-moderate mucosal thickening the left-sided ethmoid air cells, sphenoid sinus, the maxillary sinus. No significant abnormal signal of the mastoid air cells. Orbits are unremarkable. Other: None. IMPRESSION: Motion degradation of several sequences. 1. Two punctate foci of reduced diffusion are present in the body of corpus callosum and left posterior temporal cortex. Findings probably represent recent microvascular ischemia of uncertain etiology. No secondary findings of vasculitis, meningitis, or encephalitis. 2. Stable moderate age advanced volume loss of the brain. 3. Left-sided paranasal sinus disease. Electronically Signed   By: Kristine Garbe M.D.   On: 08/27/2018 23:35         Scheduled Meds: Continuous Infusions:  heparin 600 Units/hr (08/29/18 1224)     LOS: 2 days    Time spent: 25 minutes     Barb Merino, MD [facility] Pager [phone]  If 7PM-7AM, please contact night-coverage [email or web address] Password [facility] 08/29/2018, 2:05 PM

## 2018-08-29 NOTE — Progress Notes (Signed)
ANTICOAGULATION CONSULT NOTE - Follow Up Consult  Pharmacy Consult for heparin Indication: DVT  Labs: Recent Labs    08/27/18 0455 08/28/18 0631 08/29/18 0415  HGB 11.7* 11.2* 11.2*  HCT 36.7 33.3* 33.3*  PLT 123* 115* 116*  HEPARINUNFRC 0.40 0.36 0.22*  CREATININE  --  0.58  --     Assessment: 53yo female now subtherapeutic on heparin after multiple days at goal though had been steadily trending down; no gtt issues or signs of bleeding per RN.  Goal of Therapy:  Heparin level 0.3-0.7 units/ml   Plan:  Will increase heparin gtt by 2 units/kg/hr to 600 units/hr and check level in 6 hours.    Wynona Neat, PharmD, BCPS  08/29/2018,6:14 AM

## 2018-08-29 NOTE — Progress Notes (Signed)
Subjective: Continues to be encephalopathic but significantly improved since yesterday.   Objective: Current vital signs: BP (!) 163/85 (BP Location: Left Arm)   Pulse (!) 53   Temp 98.1 F (36.7 C) (Oral)   Resp 15   Ht 5\' 3"  (1.6 m)   Wt 48.4 kg   LMP 12/29/2001   SpO2 99%   BMI 18.90 kg/m  Vital signs in last 24 hours: Temp:  [97.6 F (36.4 C)-98.4 F (36.9 C)] 97.7 F (36.5 C) (03/12 0759) Pulse Rate:  [53-72] 66 (03/12 0759) Resp:  [15-18] 16 (03/12 0759) BP: (144-185)/(83-108) 185/104 (03/12 0759) SpO2:  [99 %-100 %] 99 % (03/12 0300)  Intake/Output from previous day: 03/11 0701 - 03/12 0700 In: 362.7 [P.O.:240; I.V.:122.7] Out: -  Intake/Output this shift: No intake/output data recorded. Nutritional status:  Diet Order            Diet Heart Room service appropriate? Yes; Fluid consistency: Thin  Diet effective now             HEENT: [place]/AT Lungs: Respirations unlabored  Neurologic Exam: Ment: Drowsy to somnolent on initial visit this AM. On follow up exam at 11:20 drowsiness improves to an alert state, with increased latency of verbal responses. Will answer simple questions and follow commands. As also seen yesterday, when talking patient does not move her mouth fully thus at times is difficult to understand what she is saying. Increased latency of motor and verbal responses.  CN: PERRL. Fixates on some visual stimuli. EOMI without nystagmus. Face symmetric.  Motor: Increased latency of motor responses. Will resist with 4/5 strength x 4 in the context of poor effort. No asymmetry noted.  Sensory: Reacts to tactile stimulation x 4. Reflexes: Brisk reflexes x 4  Cerebellar: Not cooperative.   Lab Results: Results for orders placed or performed during the hospital encounter of 08/27/18 (from the past 48 hour(s))  Urinalysis, Routine w reflex microscopic     Status: Abnormal   Collection Time: 08/28/18  5:47 AM  Result Value Ref Range   Color, Urine AMBER  (A) YELLOW    Comment: BIOCHEMICALS MAY BE AFFECTED BY COLOR   APPearance CLOUDY (A) CLEAR   Specific Gravity, Urine 1.026 1.005 - 1.030   pH 7.0 5.0 - 8.0   Glucose, UA NEGATIVE NEGATIVE mg/dL   Hgb urine dipstick LARGE (A) NEGATIVE   Bilirubin Urine NEGATIVE NEGATIVE   Ketones, ur NEGATIVE NEGATIVE mg/dL   Protein, ur 30 (A) NEGATIVE mg/dL   Nitrite NEGATIVE NEGATIVE   Leukocytes,Ua LARGE (A) NEGATIVE   RBC / HPF >50 (H) 0 - 5 RBC/hpf   WBC, UA 21-50 0 - 5 WBC/hpf   Bacteria, UA FEW (A) NONE SEEN    Comment: Performed at [facility], [address], [place], [place] [address]  Basic metabolic panel     Status: Abnormal   Collection Time: 08/28/18  6:31 AM  Result Value Ref Range   Sodium 140 135 - 145 mmol/L   Potassium 3.6 3.5 - 5.1 mmol/L   Chloride 109 98 - 111 mmol/L   CO2 23 22 - 32 mmol/L   Glucose, Bld 102 (H) 70 - 99 mg/dL   BUN 22 (H) 6 - 20 mg/dL   Creatinine, Ser 0.58 0.44 - 1.00 mg/dL   Calcium 9.3 8.9 - 10.3 mg/dL   GFR calc non Af Amer >60 >60 mL/min   GFR calc Af Amer >60 >60 mL/min   Anion gap 8 5 -  15    Comment: Performed at [facility], [place] [address] [place], [place] [address]  Heparin level (unfractionated)     Status: None   Collection Time: 08/28/18  6:31 AM  Result Value Ref Range   Heparin Unfractionated 0.36 0.30 - 0.70 IU/mL    Comment: (NOTE) If heparin results are below expected values, and patient dosage has  been confirmed, suggest follow up testing of antithrombin III levels. Performed at [facility], [place] [address], [place], [place] [address]   CBC     Status: Abnormal   Collection Time: 08/28/18  6:31 AM  Result Value Ref Range   WBC 14.3 (H) 4.0 - 10.5 K/uL   RBC 3.52 (L) 3.87 - 5.11 MIL/uL   Hemoglobin 11.2 (L) 12.0 - 15.0 g/dL   HCT 33.3 (L) 36.0 - 46.0 %   MCV 94.6 80.0 - 100.0 fL   MCH 31.8 26.0 - 34.0 pg   MCHC 33.6 30.0 - 36.0 g/dL   RDW 12.4 11.5 - 15.5 %   Platelets 115 (L) 150 - 400 K/uL     Comment: REPEATED TO VERIFY PLATELET COUNT CONFIRMED BY SMEAR    nRBC 0.0 0.0 - 0.2 %    Comment: Performed at [facility], [place] [address], [place], [place] [address]  Complement, total     Status: None   Collection Time: 08/28/18  6:31 AM  Result Value Ref Range   Compl, Total (CH50) >60 >41 U/mL    Comment: (NOTE)             Age                Female          Female          1 - 30 days         Not Estab.     Not Estab.    31 days -  6 months          >32            >20   7 months - 17 years           >39            >39             >17 years           >41            >41 **NOTE: The adult (">17 years") reference interval**         range is used to flag abnormals on this         report. If the patient is 37 years old or         younger, use the table above to determine         out of range values. Performed At: [facility] [place], [place] [id number] Rush Farmer MD HF:[id number]   Heparin level (unfractionated)     Status: Abnormal   Collection Time: 08/29/18  4:15 AM  Result Value Ref Range   Heparin Unfractionated 0.22 (L) 0.30 - 0.70 IU/mL    Comment: (NOTE) If heparin results are below expected values, and patient dosage has  been confirmed, suggest follow up testing of antithrombin III levels. Performed at [facility], [place] [address] [place], [place] [address]   CBC     Status: Abnormal   Collection Time: 08/29/18  4:15 AM  Result Value Ref Range   WBC 13.5 (H) 4.0 - 10.5 K/uL   RBC 3.50 (L) 3.87 - 5.11 MIL/uL   Hemoglobin 11.2 (L) 12.0 - 15.0 g/dL   HCT 33.3 (L) 36.0 - 46.0 %   MCV 95.1 80.0 - 100.0 fL   MCH 32.0 26.0 - 34.0 pg   MCHC 33.6 30.0 - 36.0 g/dL   RDW 12.4 11.5 - 15.5 %   Platelets 116 (L) 150 - 400 K/uL    Comment: SPECIMEN CHECKED FOR CLOTS Immature Platelet Fraction may be clinically indicated, consider ordering this additional test [id number] CONSISTENT WITH PREVIOUS RESULT    nRBC 0.0 0.0 - 0.2 %     Comment: Performed at [facility], [place] [address], [place], [place] [address]    No results found for this or any previous visit (from the past 240 hour(s)).  Lipid Panel No results for input(s): CHOL, TRIG, HDL, CHOLHDL, VLDL, LDLCALC in the last 72 hours.  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 EXAM: MRI HEAD WITHOUT AND WITH CONTRAST TECHNIQUE: Multiplanar, multiecho pulse sequences of the brain and surrounding structures were obtained without and with intravenous contrast. CONTRAST:  5 cc Gadavist COMPARISON:  08/24/2018 CTA of the head. 08/23/2018 CT head and MRI head. FINDINGS: Brain: Motion degradation of several sequences. 2 punctate foci of reduced diffusion are present within the body of corpus callosum and left posterior temporal cortex (series 5 image 70 and 80). Few stable nonspecific T2 hyperintensities are present in periventricular white matter and predominantly the frontal lobes. Moderate diffuse age advanced volume loss of the brain with relative cerebellar sparing. No extra-axial collection, hemorrhage, hydrocephalus, mass effect, or herniation. After administration of intravenous contrast there is no abnormal enhancement. Vascular: Normal flow voids. Skull and upper cervical spine: Normal marrow signal. Sinuses/Orbits: Mild-to-moderate mucosal thickening the left-sided ethmoid air cells, sphenoid sinus, the maxillary sinus. No significant abnormal signal of the mastoid air cells. Orbits are unremarkable. Other: None. IMPRESSION: Motion degradation of several sequences. 1. Two punctate foci of reduced diffusion are present in the body of corpus callosum and left posterior temporal cortex. Findings probably represent recent microvascular ischemia of uncertain etiology. No secondary findings of vasculitis, meningitis, or encephalitis. 2.  Stable moderate age advanced volume loss of the brain. 3. Left-sided paranasal sinus disease. Electronically Signed   By: Kristine Garbe M.D.   On: 08/27/2018 23:35    Medications:  Scheduled:  Continuous: . 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 -- She has been treated with IV Solu-Medrol-has received 5/5 doses  -- Significantly improved since admission -- Repeat MRI performed Tuesday night showed no definite change relative to the prior study performed on 3/7. Two punctate foci of reduced diffusion on the follow up study were not seen on the earlier study, most likely due to its lower resolution. Findings probably represent recent microvascular ischemia. No secondary findings of vasculitis, meningitis, or encephalitis. Stable moderate age advanced volume loss of the brainis also noted. -- Spot EEG from yesterday revealed general background slowing, as may be seen with a metabolic encephalopathy, among other possibilities. No epileptiform activity was noted.   Recommendations: -- LTM EEG is currently running. Await report.  -- Decision  for Cytoxan - will defer to Rheumatology. MRI findings not sufficient from a neurological standpoint to warrant initiation of this medication, given her improvement with steroids.  -- Hospitalist service anticipates discharging patient to rehab after LTM EEG is discontinued.    LOS: 2 days   @Electronically  signed: Dr. Kerney Elbe 08/29/2018  11:17 AM

## 2018-08-30 LAB — CBC
HCT: 33.3 % — ABNORMAL LOW (ref 36.0–46.0)
Hemoglobin: 11.1 g/dL — ABNORMAL LOW (ref 12.0–15.0)
MCH: 31.8 pg (ref 26.0–34.0)
MCHC: 33.3 g/dL (ref 30.0–36.0)
MCV: 95.4 fL (ref 80.0–100.0)
Platelets: 98 10*3/uL — ABNORMAL LOW (ref 150–400)
RBC: 3.49 MIL/uL — ABNORMAL LOW (ref 3.87–5.11)
RDW: 12.1 % (ref 11.5–15.5)
WBC: 11.5 10*3/uL — ABNORMAL HIGH (ref 4.0–10.5)
nRBC: 0 % (ref 0.0–0.2)

## 2018-08-30 LAB — HEPARIN LEVEL (UNFRACTIONATED): Heparin Unfractionated: <0.1 IU/mL — ABNORMAL LOW (ref 0.30–0.70)

## 2018-08-30 MED ORDER — RIVAROXABAN 20 MG PO TABS
20.0000 mg | ORAL_TABLET | Freq: Every day | ORAL | Status: DC
  Administered 2018-08-30 – 2018-09-02 (×3): 20 mg via ORAL
  Filled 2018-08-30 (×4): qty 1

## 2018-08-30 MED ORDER — SPIRONOLACTONE 25 MG PO TABS
25.0000 mg | ORAL_TABLET | Freq: Every day | ORAL | Status: DC
  Administered 2018-08-30: 25 mg via ORAL
  Filled 2018-08-30 (×2): qty 1

## 2018-08-30 MED ORDER — HYDRALAZINE HCL 25 MG PO TABS
25.0000 mg | ORAL_TABLET | Freq: Three times a day (TID) | ORAL | Status: DC
  Administered 2018-08-30 – 2018-08-31 (×2): 25 mg via ORAL
  Filled 2018-08-30 (×3): qty 1

## 2018-08-30 MED ORDER — HYDROXYCHLOROQUINE SULFATE 200 MG PO TABS
200.0000 mg | ORAL_TABLET | Freq: Every day | ORAL | Status: DC
  Administered 2018-08-30 – 2018-09-02 (×3): 200 mg via ORAL
  Filled 2018-08-30 (×4): qty 1

## 2018-08-30 MED ORDER — ATORVASTATIN CALCIUM 80 MG PO TABS
80.0000 mg | ORAL_TABLET | Freq: Every day | ORAL | Status: DC
  Administered 2018-08-30 – 2018-09-02 (×3): 80 mg via ORAL
  Filled 2018-08-30 (×4): qty 1

## 2018-08-30 MED ORDER — TRAMADOL HCL 50 MG PO TABS: 50.0000 mg | ORAL_TABLET | Freq: Four times a day (QID) | ORAL | Status: DC | PRN

## 2018-08-30 MED ORDER — LOSARTAN POTASSIUM 50 MG PO TABS
100.0000 mg | ORAL_TABLET | Freq: Every day | ORAL | Status: DC
  Administered 2018-08-30: 100 mg via ORAL
  Filled 2018-08-30 (×2): qty 2

## 2018-08-30 NOTE — Progress Notes (Signed)
LTM EEG showed evidence of a moderate diffuse encephalopathy. No epileptiform discharges or EEG seizures were recorded.   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 1. She has been treated with IV Solu-Medrol-has received 5/5doses. Significantly improved since admission 2. No epileptiform discharges or EEG seizures on LTM EEG, which was discontinued yesterday.  3. Rheumatology follow up as outpatient.  4. Neurology follow up as outpatient.  5. Neurohospitalist service will sign off. Please call if there are additional questions.   Electronically signed: Dr. Kerney Elbe

## 2018-08-30 NOTE — Progress Notes (Addendum)
[place] for heparin Indication: DVT  Allergies  Allergen Reactions  . Ace Inhibitors Other (See Comments)    Angioedema of face    Patient Measurements: Height: 5\' 3"  (160 cm) Weight: 106 lb 11.2 oz (48.4 kg) IBW/kg (Calculated) : 52.4 Heparin Dosing Weight: 45.4 kg  Vital Signs: Temp: 98 F (36.7 C) (03/13 0351) Temp Source: Oral (03/13 0351) BP: 165/88 (03/13 0351) Pulse Rate: 55 (03/13 0351)  Labs: Recent Labs    08/28/18 0631 08/29/18 0415 08/29/18 1228 08/30/18 0531  HGB 11.2* 11.2*  --  11.1*  HCT 33.3* 33.3*  --  33.3*  PLT 115* 116*  --  98*  HEPARINUNFRC 0.36 0.22* 0.36 <0.10*  CREATININE 0.58  --   --   --     Estimated Creatinine Clearance: 62.9 mL/min (by C-G formula based on SCr of 0.58 mg/dL).   Medical History: Past Medical History:  Diagnosis Date  . Anxiety   . Collagen vascular disease ([place])   . DVT (deep venous thrombosis) ([place])   . Hypertension   . Lupus (systemic lupus erythematosus) ([place])   . Spinal cord lesion [facility])     Assessment: 53 year old female on Xarelto 20 mg daily PTA. Last dose reported 3/5 at 0800. Pharmacy consulted for heparin dosing for VTE treatment.  Heparin level undetectable this AM, no line issues and heparin infusing per RN report.  Goal of Therapy:  Heparin level 0.3-0.7 units/ml Monitor platelets by anticoagulation protocol: Yes   Plan:  Increase heparin gtt to 750 units/hr F/u 6 hour heparin level F/u switch back to [place], PharmD Clinical Pharmacist Please check AMION for all [place] numbers 08/30/2018 8:16 AM    Addendum: Will now transition back to Xarelto D/c heparin gtt Start Xarelto 20mg  PO once daily at same time as heparin turned off

## 2018-08-30 NOTE — Progress Notes (Signed)
Physical Therapy Treatment Patient Details Name: Valerie Bell MRN: [id number] DOB: January 09, 1966 Today's Date: 08/30/2018    History of Present Illness Pt is a 53 y/o female admitted from [facility] secondary to [place]. MRI was negative for any acute findings. EEG- moderate diffuse encephalopathy. BRain MRI- 2 punctate foci of reduced diffusion of corpus callosum and left posterior temporal cortex. PMH including but not limited to collagen vascular disease, Lupus, anxiety, spinal cord lesion.    PT Comments    Patient progressing well towards PT goals. Tolerated standing and taking a few steps to get to chair with Mod A for balance/safety secondary to posterior/right lateral lean and weakness. Pt also with cognitive deficits- disoriented, impaired awareness, impaired problem solving and difficulty following commands without repetition. Slow to respond to questions. Pt also with aphasia making assessment of cognition challenging. Pt making marked improvement in mobility and arousal this session. Discharge recommendation updated to CIR. Will follow.   Follow Up Recommendations  CIR     Equipment Recommendations  None recommended by PT    Recommendations for Other Services       Precautions / Restrictions Precautions Precautions: Fall;Other (comment) Precaution Comments: seizure Restrictions Weight Bearing Restrictions: No    Mobility  Bed Mobility Overal bed mobility: Needs Assistance Bed Mobility: Rolling;Sidelying to Sit Rolling: Mod assist;+2 for physical assistance Sidelying to sit: Mod assist;+2 for physical assistance;HOB elevated       General bed mobility comments: Step by step cues to roll each way with difficulty sequencing movement; assist to get to EOB and elevate trunk. Increased time.   Transfers Overall transfer level: Needs assistance Equipment used: Rolling walker (2 wheeled) Transfers: Sit to/from Stand Sit to Stand: Mod assist         General transfer comment:  Assist to power to standing with right heavy lean and forward flexed posture. Posterior lean. Able to initiate to midline but not sustain.   Ambulation/Gait Ambulation/Gait assistance: Mod assist Gait Distance (Feet): 5 Feet Assistive device: Rolling walker (2 wheeled) Gait Pattern/deviations: Step-to pattern;Leaning posteriorly;Shuffle;Narrow base of support Gait velocity: decreased   General Gait Details: Slow, unsteady and shuffling like gait with posterior lean and to the right requiring MOd A for balance/sequencing.    Stairs             Wheelchair Mobility    Modified Rankin (Stroke Patients Only)       Balance Overall balance assessment: Needs assistance Sitting-balance support: Feet supported;Bilateral upper extremity supported Sitting balance-Leahy Scale: Poor Sitting balance - Comments: Min A-Min guard assist for static sitting balance with right lateral lean.  Postural control: Right lateral lean Standing balance support: During functional activity Standing balance-Leahy Scale: Poor Standing balance comment: Requires BUE support and Min A for static standing balance; Mod A for dynamic standing. worked on finding midline and upright.                             Cognition Arousal/Alertness: Awake/alert Behavior During Therapy: Flat affect Overall Cognitive Status: Difficult to assess Area of Impairment: Orientation;Attention;Memory;Safety/judgement;Problem solving                 Orientation Level: Disoriented to;Place;Time;Situation Current Attention Level: Sustained Memory: Decreased short-term memory Following Commands: Follows one step commands inconsistently;Follows one step commands with increased time(and repetition) Safety/Judgement: Decreased awareness of deficits;Decreased awareness of safety Awareness: Intellectual Problem Solving: Slow processing;Decreased initiation;Difficulty sequencing;Requires verbal cues;Requires tactile  cues General Comments: "May, "  for date, states no for being in the hospital. Apraxic? Requires repetition of cues as pt with difficulty sequencing.      Exercises      General Comments        Pertinent Vitals/Pain Pain Assessment: Faces Faces Pain Scale: Hurts little more Pain Location: generalized with mobility Pain Descriptors / Indicators: Grimacing Pain Intervention(s): Monitored during session;Repositioned    Home Living                      Prior Function            PT Goals (current goals can now be found in the care plan section) Progress towards PT goals: Progressing toward goals    Frequency    Min 3X/week      PT Plan Discharge plan needs to be updated    Co-evaluation              AM-PAC PT "6 Clicks" Mobility   Outcome Measure  Help needed turning from your back to your side while in a flat bed without using bedrails?: A Lot Help needed moving from lying on your back to sitting on the side of a flat bed without using bedrails?: A Lot Help needed moving to and from a bed to a chair (including a wheelchair)?: A Lot Help needed standing up from a chair using your arms (e.g., wheelchair or bedside chair)?: A Lot Help needed to walk in hospital room?: A Lot Help needed climbing 3-5 steps with a railing? : Total 6 Click Score: 11    End of Session Equipment Utilized During Treatment: Gait belt Activity Tolerance: Patient tolerated treatment well Patient left: in chair;with call bell/phone within reach;with chair alarm set Nurse Communication: Mobility status PT Visit Diagnosis: Other abnormalities of gait and mobility (R26.89);Muscle weakness (generalized) (M62.81)     Time: 5361-4431 PT Time Calculation (min) (ACUTE ONLY): 23 min  Charges:  $Gait Training: 8-22 mins $Therapeutic Activity: 8-22 mins                     Wray Kearns, [place], DPT Acute Rehabilitation Services Pager [phone] Office  [place] 08/30/2018, 2:33 PM

## 2018-08-30 NOTE — Care Management Important Message (Signed)
Important Message  Patient Details  Name: Valerie Bell MRN: [id number] Date of Birth: 24-Mar-1966   Medicare Important Message Given:  Other (see comment)   Due to illness patient could not sign.  Unsigned copy left at bedside  Orbie Pyo 08/30/2018, 2:49 PM

## 2018-08-30 NOTE — Progress Notes (Signed)
Inpatient Rehabilitation Admissions Coordinator  Patient not yet at a level to participate in an inpt rehab admission. We will follow her progress and Karene Fry will follow up on Monday.  Danne Baxter, RN, MSN Rehab Admissions Coordinator [phone] 08/30/2018 1:05 PM

## 2018-08-30 NOTE — Progress Notes (Signed)
[facility] TEAM 1 - Stepdown/ICU TEAM  Valerie Bell  DQQ:[id number] DOB: March 13, 1966 DOA: 08/27/2018 PCP: [facility]    Brief Narrative:  53yo w/ a history of anxiety, DVT, hypertension, and lupus who was admitted to [facility] with altered mental status. 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 She was transferred to [facility] for continuous EEG monitoring.  Subjective: Sitting up in bed feeding herself lunch. Denies cp, sob, n/v, or abdom pain.   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 - has finished 5 days of high dose steroid therapy - spot EEG with only generalized slowing - continuous video EEG w/o evidence of seizure activity - MRI brain showed 2 punctate foci of reduced diffusion on corpus callosum and left posterior temporal cortex "probably represent recent microvascular ischemia" per Neuro - outpatient f/u w/ Rheumatology on discharge - no further neuro eval indicated per Neurology   Recently diagnosed DVT Was on Xarelto - covered w/ IV heparin while w/u was underway - transition back to Xarelto today   Hypertension BP trending up - adjust tx and follow / resume usual home meds   DVT prophylaxis: IV heparin  Code Status: FULL CODE Family Communication: no family present at time of exam  Disposition Plan: hopeful for CIR admit - stable for tele bed transfer   Consultants:  Neurology  Antimicrobials:  none   Objective: Blood pressure (!) 154/94, pulse (!) 55, temperature 98.4 F (36.9 C), temperature source Oral, resp. rate 17, height 5\' 3"  (1.6 m), weight 48.4 kg, last menstrual period 12/29/2001, SpO2 98 %. No intake or output data in the 24 hours ending 08/30/18 1149 Filed Weights   08/27/18 1650  Weight: 48.4 kg    Examination: General: No acute respiratory distress Lungs: Clear to auscultation  bilaterally without wheezes or crackles Cardiovascular: Regular rate and rhythm without murmur gallop or rub normal S1 and S2 Abdomen: Nontender, nondistended, soft, bowel sounds positive, no rebound, no ascites, no appreciable mass Extremities: No significant cyanosis, clubbing, or edema bilateral lower extremities  CBC: Recent Labs  Lab 08/23/18 1821  08/27/18 0455 08/28/18 0631 08/29/18 0415 08/30/18 0531  WBC 10.1   < > 14.5* 14.3* 13.5* 11.5*  NEUTROABS 8.4*  --  13.0*  --   --   --   HGB 14.4   < > 11.7* 11.2* 11.2* 11.1*  HCT 44.4   < > 36.7 33.3* 33.3* 33.3*  MCV 96.7   < > 97.9 94.6 95.1 95.4  PLT 161   < > 123* 115* 116* 98*   < > = values in this interval not displayed.   Basic Metabolic Panel: Recent Labs  Lab 08/23/18 1936 08/28/18 0631  NA 138 140  K 4.0 3.6  CL 102 109  CO2 27 23  GLUCOSE 93 102*  BUN 21* 22*  CREATININE 0.86 0.58  CALCIUM 9.2 9.3   GFR: Estimated Creatinine Clearance: 62.9 mL/min (by C-G formula based on SCr of 0.58 mg/dL).  Liver Function Tests: Recent Labs  Lab 08/23/18 1936  AST 19  ALT 12  ALKPHOS 55  BILITOT 0.3  PROT 7.9  ALBUMIN 4.0    Recent Labs  Lab 08/23/18 2107  AMMONIA <9*    Coagulation Profile: Recent Labs  Lab 08/25/18 1133  INR 1.3*    Cardiac Enzymes: Recent Labs  Lab 08/23/18 1936  TROPONINI <  0.03    CBG: Recent Labs  Lab 08/23/18 1800  GLUCAP 79     Scheduled Meds: Continuous Infusions: . heparin 750 Units/hr (08/30/18 0827)     LOS: 3 days   Cherene Altes, MD [facility] Office  [phone] Pager - Text Page per Amion  If 7PM-7AM, please contact night-coverage per Amion 08/30/2018, 11:49 AM

## 2018-08-30 NOTE — Progress Notes (Signed)
EEG LTM complete. No skin breakdown 

## 2018-08-31 LAB — CBC
HCT: 36.1 % (ref 36.0–46.0)
Hemoglobin: 11.9 g/dL — ABNORMAL LOW (ref 12.0–15.0)
MCH: 31.3 pg (ref 26.0–34.0)
MCHC: 33 g/dL (ref 30.0–36.0)
MCV: 95 fL (ref 80.0–100.0)
Platelets: 122 10*3/uL — ABNORMAL LOW (ref 150–400)
RBC: 3.8 MIL/uL — ABNORMAL LOW (ref 3.87–5.11)
RDW: 12.1 % (ref 11.5–15.5)
WBC: 10.8 10*3/uL — ABNORMAL HIGH (ref 4.0–10.5)
nRBC: 0 % (ref 0.0–0.2)

## 2018-08-31 LAB — BASIC METABOLIC PANEL
Anion gap: 8 (ref 5–15)
BUN: 12 mg/dL (ref 6–20)
CO2: 26 mmol/L (ref 22–32)
Calcium: 8.8 mg/dL — ABNORMAL LOW (ref 8.9–10.3)
Chloride: 102 mmol/L (ref 98–111)
Creatinine, Ser: 0.69 mg/dL (ref 0.44–1.00)
GFR calc Af Amer: >60 mL/min (ref >60)
GFR calc non Af Amer: >60 mL/min (ref >60)
Glucose, Bld: 92 mg/dL (ref 70–99)
Potassium: 3.3 mmol/L — ABNORMAL LOW (ref 3.5–5.1)
Sodium: 136 mmol/L (ref 135–145)

## 2018-08-31 MED ORDER — POTASSIUM CHLORIDE CRYS ER 20 MEQ PO TBCR
40.0000 meq | EXTENDED_RELEASE_TABLET | Freq: Once | ORAL | Status: AC
  Administered 2018-08-31: 40 meq via ORAL
  Filled 2018-08-31: qty 2

## 2018-08-31 NOTE — Evaluation (Signed)
Clinical/Bedside Swallow Evaluation Patient Details  Name: Valerie Bell MRN: [id number] Date of Birth: 1966-01-05  Today's Date: 08/31/2018 Time: SLP Start Time (ACUTE ONLY): 1645 SLP Stop Time (ACUTE ONLY): 1710 SLP Time Calculation (min) (ACUTE ONLY): 25 min  Past Medical History:  Past Medical History:  Diagnosis Date  . Anxiety   . Collagen vascular disease ([place])   . DVT (deep venous thrombosis) ([place])   . Hypertension   . Lupus (systemic lupus erythematosus) ([place])   . Spinal cord lesion [facility])    Past Surgical History:  Past Surgical History:  Procedure Laterality Date  . ABDOMINAL HYSTERECTOMY    . BREAST BIOPSY Right 17+ years ago   negative Core Bx   HPI:   Valerie Bell is a 53 y.o. female with documented history of anxiety, collagen vascular disease, DVT, hypertension, lupus, presenting to the hospital as a transfer from [facility], where she was initially seen by neurology for altered mental status. According to the initial neurology consult dictated by Dr. Orlena Sheldon, the patient has had lupus for 2 years and was brought into [place] [facility] because she was having difficulty walking and had episode of staring and unresponsiveness during which time she was also nonverbal.  There was no reported head injury.  MRI of the brain was performed which was unremarkable. At [facility], patient was started on IV Solu-Medrol for presumptive lupus cerebritis and has completed 4 days of IV Solu-Medrol and per progress notes from Dr. Orlena Sheldon and Dr. Doy Mince, is doing better than before.  It is unclear what her baseline is at this time. Pt seen by SLP at [facility] on 08/27/18, found to have rigidity but automatic response to tactile cues with PO, no signs of aspiration, started on dys 2/thin liquids. Seen by SLP at [facility] on 08/28/18 with improvement in function and regular/thin was initiated. SLP reconsulted re: swallow d/t pt pocketing pills and not  initiating swallow.   Assessment / Plan / Recommendation Clinical Impression  Pt seen for reassessment of swallow function due to reported oral holding, pocketing of pills per RN. Mentation/swallow function appears to be fluctuating, and pt's function appeared today more consistent with what was documented at [facility] on 08/27/18. Rigidity of mandible noted today, as well as inconsistent ability to follow one-step commands (<25%). SLP provided usual mod assist for feeding via cup/straw; anterior loss with cup sips due to poor labial seal. Pt able to pull from straw with only min verbal cue for initial sip. Prolonged oral holding with liquids, 15-20 seconds for each sip even with tactile cues. Airway protection appears adequate. When presented with spoon in yogurt, pt attempting to drink as if using a straw; with mod tactile assist initially (fading to supervision), able to self feed puree and chopped fruit with spoon. Pocketing of soft fruit noted on left side. When given regular graham cracker, pt attempted to shove the entire cracker in her mouth at once. Advise downgrade to dys 2, thin liquids, crush meds in puree. SLP to follow up for tolerance and advancement. SLP Visit Diagnosis: Dysphagia, oral phase (R13.11)    Aspiration Risk  Mild aspiration risk    Diet Recommendation Dysphagia 2 (Fine chop);Thin liquid   Liquid Administration via: Straw Medication Administration: Crushed with puree Supervision: Staff to assist with self feeding;Full supervision/cueing for compensatory strategies Compensations: Slow rate;Small sips/bites Postural Changes: Seated upright at 90 degrees    Other  Recommendations Oral Care Recommendations: Oral care BID   Follow  up Recommendations Inpatient Rehab      Frequency and Duration min 2x/week  1 week       Prognosis Prognosis for Safe Diet Advancement: Good      Swallow Study   General Date of Onset: 08/27/18 HPI:  Valerie Bell is a 53 y.o. female with  documented history of anxiety, collagen vascular disease, DVT, hypertension, lupus, presenting to the hospital as a transfer from [facility], where she was initially seen by neurology for altered mental status. According to the initial neurology consult dictated by Dr. Orlena Sheldon, the patient has had lupus for 2 years and was brought into [place] [facility] because she was having difficulty walking and had episode of staring and unresponsiveness during which time she was also nonverbal.  There was no reported head injury.  MRI of the brain was performed which was unremarkable. At [facility], patient was started on IV Solu-Medrol for presumptive lupus cerebritis and has completed 4 days of IV Solu-Medrol and per progress notes from Dr. Orlena Sheldon and Dr. Doy Mince, is doing better than before.  It is unclear what her baseline is at this time. Pt seen by SLP at [facility] on 08/27/18, found to have rigidity but automatic response to tactile cues with PO, no signs of aspiration, started on dys 2/thin liquids. Seen by SLP at [facility] on 08/28/18 with improvement in function and regular/thin was initiated. SLP reconsulted re: swallow d/t pt pocketing pills and not initiating swallow. Type of Study: Bedside Swallow Evaluation Previous Swallow Assessment: see HPI Diet Prior to this Study: NPO Temperature Spikes Noted: No Respiratory Status: Room air History of Recent Intubation: No Behavior/Cognition: Alert;Cooperative;Confused;Distractible;Requires cueing Oral Cavity Assessment: Within Functional Limits Oral Care Completed by SLP: No Oral Cavity - Dentition: Adequate natural dentition;Missing dentition Vision: Functional for self-feeding Self-Feeding Abilities: Needs assist(Mod assist, fading to occasional min A) Patient Positioning: Upright in bed Baseline Vocal Quality: Normal;Low vocal intensity Volitional Cough: Cognitively unable to elicit Volitional Swallow: Unable to elicit     Oral/Motor/Sensory Function Overall Oral Motor/Sensory Function: Other (comment)(Pt does not follow oral motor commands) Facial Symmetry: Within Functional Limits   Ice Chips Ice chips: Not tested   Thin Liquid Thin Liquid: Impaired Presentation: Straw;Cup Oral Phase Impairments: Reduced labial seal Oral Phase Functional Implications: Right anterior spillage;Left anterior spillage;Oral holding(spillage with cup) Pharyngeal  Phase Impairments: (none noted)    Nectar Thick Nectar Thick Liquid: Not tested   Honey Thick Honey Thick Liquid: Not tested   Puree Puree: Impaired Presentation: Spoon;Self Fed(mod assist initially) Oral Phase Functional Implications: Prolonged oral transit;Right anterior spillage   Solid     Solid: Impaired Presentation: Self Fed Oral Phase Impairments: Poor awareness of bolus Oral Phase Functional Implications: Left lateral sulci pocketing Pharyngeal Phase Impairments: (none noted) Other Comments: impulsive, attempting large bites     Deneise Lever, MS, [place] Pager: [phone] Office: [phone]  Aliene Altes 08/31/2018,6:43 PM

## 2018-08-31 NOTE — Progress Notes (Addendum)
PROGRESS NOTE    Valerie Bell  DGU:[id number] DOB: 1965/07/29 DOA: 08/27/2018 PCP: [facility]   Brief Narrative:  53yo w/ Valerie Bell history of anxiety, DVT, hypertension, and lupus who was admitted to [facility] with altered mental status. 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 She was transferred to [facility] for continuous EEG monitoring.  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  has now finished5 days of high dose steroid therapy (solumedrol 1000 mg daily x 5 days)  spot EEG with only generalized slowing - continuous video EEG w/o evidence of seizure activity  MRI brain showed 2 punctate foci of reduced diffusion on corpus callosum and left posterior temporal cortex "probably represent recent microvascular ischemia" per Neuro  Still encephalopathic, Valerie Bell&Ox1 today - will try to discuss with family/friends for baseline Discussed again with neurology given unclear baseline and continued AMS, planning to continue to follow Will need outpatient rheum follow up at discharge  Dysphagia: nursing concern with ability to swallow today.  NPO for now, follow SLP eval.  ?related to above.  Recently diagnosed DVT Currently on xarelto  Hypertension Hold BP meds (addendum) Continue to follow BP's (most recent low, will follow repeat with nursing and adjust as indicated)  Thrombocytopenia: continue to monitor, stable  Leukocytosis: mild, improving, follow  DVT prophylaxis: xarelto Code Status: full  Family Communication: will call listed contact Disposition Plan: pending further improvement, likely CIR   Consultants:   neurology  Procedures:  Overnight EEG Impression: This EEG  shows evidence of Emmitte Surgeon moderate diffuse encephalopathy. No epileptiform discharges or EEG seizures were recorded.   EEG IMPRESSION: This is Hyatt Capobianco technically difficult record due to the predominance of muscle and movement artifact.  When able to be evaluated this record is dominated by general background slowing.  This finding may be seen with Corinthia Helmers diffuse disturbance that is etiologically nonspecific, but may include Mindy Behnken metabolic encephalopathy, among other possibilities.  No epileptiform activity was noted.    Antimicrobials:  Anti-infectives (From admission, onward)   Start     Dose/Rate Route Frequency Ordered Stop   08/30/18 1300  hydroxychloroquine (PLAQUENIL) tablet 200 mg     200 mg Oral Daily 08/30/18 1159       Subjective: [name]&Ox1, self. Denies pain. Slow to respond. This is my first day with her and nurses first day as well.  Unclear baseline. Follows commands.  [name] bit slow to respond.  Seems similar to neuro eval on 3/12?  Objective: Vitals:   08/30/18 1321 08/30/18 1655 08/31/18 0804 08/31/18 1252  BP: (!) 120/96 108/85 108/84 (!) 83/59  Pulse: 82 90 68 89  Resp: 15 15 16 16   Temp: 98.1 F (36.7 C) 99.1 F (37.3 C) 97.8 F (36.6 C) 98 F (36.7 C)  TempSrc: Oral Oral Axillary Axillary  SpO2: 99% 98% 99% 98%  Weight:      Height:       No intake or output data in the 24 hours ending 08/31/18 1512 Filed Weights   08/27/18 1650  Weight: 48.4 kg    Examination:  General exam: Appears calm and comfortable  Respiratory system: Clear to auscultation. Respiratory effort normal. Cardiovascular system: S1 & S2 heard, RRR. Gastrointestinal  system: Abdomen is nondistended, soft and nontender.  Central nervous system: Alert and disoriented (only oriented to self). Follows commands.  Moves all extremities x 4.  Extremities: no lee Skin: No rashes, lesions or ulcers    Data Reviewed: I have personally reviewed following labs and imaging studies  CBC: Recent Labs  Lab 08/27/18  0455 08/28/18 0631 08/29/18 0415 08/30/18 0531 08/31/18 1031  WBC 14.5* 14.3* 13.5* 11.5* 10.8*  NEUTROABS 13.0*  --   --   --   --   HGB 11.7* 11.2* 11.2* 11.1* 11.9*  HCT 36.7 33.3* 33.3* 33.3* 36.1  MCV 97.9 94.6 95.1 95.4 95.0  PLT 123* 115* 116* 98* 627*   Basic Metabolic Panel: Recent Labs  Lab 08/28/18 0631 08/31/18 1031  NA 140 136  K 3.6 3.3*  CL 109 102  CO2 23 26  GLUCOSE 102* 92  BUN 22* 12  CREATININE 0.58 0.69  CALCIUM 9.3 8.8*   GFR: Estimated Creatinine Clearance: 62.9 mL/min (by C-G formula based on SCr of 0.69 mg/dL). Liver Function Tests: No results for input(s): AST, ALT, ALKPHOS, BILITOT, PROT, ALBUMIN in the last 168 hours. No results for input(s): LIPASE, AMYLASE in the last 168 hours. No results for input(s): AMMONIA in the last 168 hours. Coagulation Profile: Recent Labs  Lab 08/25/18 1133  INR 1.3*   Cardiac Enzymes: No results for input(s): CKTOTAL, CKMB, CKMBINDEX, TROPONINI in the last 168 hours. BNP (last 3 results) No results for input(s): PROBNP in the last 8760 hours. HbA1C: No results for input(s): HGBA1C in the last 72 hours. CBG: No results for input(s): GLUCAP in the last 168 hours. Lipid Profile: No results for input(s): CHOL, HDL, LDLCALC, TRIG, CHOLHDL, LDLDIRECT in the last 72 hours. Thyroid Function Tests: No results for input(s): TSH, T4TOTAL, FREET4, T3FREE, THYROIDAB in the last 72 hours. Anemia Panel: No results for input(s): VITAMINB12, FOLATE, FERRITIN, TIBC, IRON, RETICCTPCT in the last 72 hours. Sepsis Labs: No results for input(s): PROCALCITON, LATICACIDVEN in the last 168 hours.  No results found for this or any previous visit (from the past 240 hour(s)).       Radiology Studies: No results found.      Scheduled Meds: . atorvastatin  80 mg Oral Daily  . hydrALAZINE  25 mg Oral TID  . hydroxychloroquine  200 mg Oral Daily  . losartan  100 mg Oral Daily  . rivaroxaban  20 mg Oral Daily  .  spironolactone  25 mg Oral Daily   Continuous Infusions:   LOS: 4 days    Time spent: over 30 min    Fayrene Helper, MD [facility] Pager AMION  If 7PM-7AM, please contact night-coverage [email or web address] Password [facility] 08/31/2018, 3:12 PM

## 2018-09-01 LAB — CBC
HCT: 35.1 % — ABNORMAL LOW (ref 36.0–46.0)
Hemoglobin: 11.6 g/dL — ABNORMAL LOW (ref 12.0–15.0)
MCH: 32.1 pg (ref 26.0–34.0)
MCHC: 33 g/dL (ref 30.0–36.0)
MCV: 97.2 fL (ref 80.0–100.0)
Platelets: 138 10*3/uL — ABNORMAL LOW (ref 150–400)
RBC: 3.61 MIL/uL — ABNORMAL LOW (ref 3.87–5.11)
RDW: 12.5 % (ref 11.5–15.5)
WBC: 11.5 10*3/uL — ABNORMAL HIGH (ref 4.0–10.5)
nRBC: 0 % (ref 0.0–0.2)

## 2018-09-01 LAB — COMPREHENSIVE METABOLIC PANEL
ALT: 23 U/L (ref 0–44)
AST: 19 U/L (ref 15–41)
Albumin: 2.6 g/dL — ABNORMAL LOW (ref 3.5–5.0)
Alkaline Phosphatase: 32 U/L — ABNORMAL LOW (ref 38–126)
Anion gap: 9 (ref 5–15)
BUN: 19 mg/dL (ref 6–20)
CO2: 24 mmol/L (ref 22–32)
Calcium: 8.6 mg/dL — ABNORMAL LOW (ref 8.9–10.3)
Chloride: 103 mmol/L (ref 98–111)
Creatinine, Ser: 0.64 mg/dL (ref 0.44–1.00)
GFR calc Af Amer: >60 mL/min (ref >60)
GFR calc non Af Amer: >60 mL/min (ref >60)
Glucose, Bld: 88 mg/dL (ref 70–99)
Potassium: 4 mmol/L (ref 3.5–5.1)
Sodium: 136 mmol/L (ref 135–145)
Total Bilirubin: 0.6 mg/dL (ref 0.3–1.2)
Total Protein: 5.5 g/dL — ABNORMAL LOW (ref 6.5–8.1)

## 2018-09-01 LAB — MAGNESIUM: Magnesium: 1.9 mg/dL (ref 1.7–2.4)

## 2018-09-01 NOTE — Progress Notes (Signed)
PROGRESS NOTE    Valerie Bell  OMV:[id number] DOB: 08/08/1965 DOA: 08/27/2018 PCP: [facility]   Brief Narrative:  53yo w/ Valerie Bell history of anxiety, DVT, hypertension, and lupus who was admitted to [facility] with altered mental status. 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 She was transferred to [facility] for continuous EEG monitoring.  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 Neurology reevaluated pt today and recommended IVIG.  Recommended discussion with next of kin (son, [name], regarding final decision making as pt unable to make informed decisions).  Unable to reach [facility] today, asked [name] to see if she could contact [facility] and have him come to hospital tomorrow if possible. Will continue to discuss with neurology.  Will need outpatient rheum follow up at discharge  Dysphagia: nursing concern with ability to swallow today.  SLP recommending dysphagia 2, thin liquid (see recs)  Recently diagnosed DVT Currently on xarelto  Hypertension Hold BP meds (addendum) Continue to follow BP's (most recent low, will follow repeat with nursing and adjust as indicated)   Thrombocytopenia: continue to monitor, stable  Leukocytosis: mild, improving, follow  DVT prophylaxis: xarelto Code Status: full  Family Communication: will call listed contact (discussed with jennifer at bedside today, unable to reach [place]) Disposition Plan: consideration of IVIG after discussion with next of kin  Consultants:   neurology  Procedures:  Overnight EEG Impression: This EEG shows evidence of Daleiza Bacchi moderate diffuse encephalopathy. No epileptiform discharges or EEG seizures were recorded.   EEG IMPRESSION: This is [name] technically difficult record due to the predominance of muscle and movement artifact.  When able to be evaluated this record is dominated by general background slowing.  This finding may be seen with [name] diffuse disturbance that is etiologically nonspecific, but may include Juwaun Inskeep metabolic encephalopathy, among other possibilities.  No epileptiform activity was noted.    Antimicrobials:  Anti-infectives (From admission, onward)   Start     Dose/Rate Route Frequency Ordered Stop   08/30/18 1300  hydroxychloroquine (PLAQUENIL) tablet 200 mg     200 mg Oral Daily 08/30/18 1159       Subjective: [name] 1 today. No complaints.  Slowed speech.  Objective: Vitals:   09/01/18 0401 09/01/18 0846 09/01/18 1244 09/01/18 1728  BP: 93/68 110/80 110/79 107/76  Pulse: 74 77 74 74  Resp: 16 16 17 15   Temp: 98 F (36.7 C) 98.2 F (36.8 C) 98.9 F (37.2 C) 98.5 F (36.9 C)  TempSrc: Axillary Oral Oral Axillary  SpO2: 99% 100% 100% 99%  Weight:      Height:       No intake or output data in the 24 hours ending 09/01/18 1734 Filed Weights   08/27/18 1650  Weight: 48.4 kg    Examination:  General: No acute distress. Cardiovascular: Heart sounds show Monica Codd regular rate, and rhythm Lungs: Clear to auscultation bilaterally  Abdomen: Soft, nontender, nondistended  Neurological: Alert and oriented 1. Moves all extremities 4. Slowed responses, follows some commands  inconsistently.  Skin: Warm and dry. No rashes or lesions. Extremities: No clubbing or cyanosis. No edema.  Psychiatric: Mood and affect are normal. Insight and judgment are impaired.     Data Reviewed: I have personally reviewed following labs and imaging studies  CBC: Recent Labs  Lab 08/27/18 0455 08/28/18 0631 08/29/18 0415 08/30/18 0531 08/31/18 1031 09/01/18 0520  WBC 14.5* 14.3* 13.5* 11.5* 10.8* 11.5*  NEUTROABS 13.0*  --   --   --   --   --   HGB 11.7* 11.2* 11.2* 11.1* 11.9* 11.6*  HCT 36.7 33.3* 33.3* 33.3* 36.1 35.1*  MCV 97.9 94.6 95.1 95.4 95.0 97.2  PLT 123* 115* 116* 98* 122* 833*   Basic Metabolic Panel: Recent Labs  Lab 08/28/18 0631 08/31/18 1031 09/01/18 0520  NA 140 136 136  K 3.6 3.3* 4.0  CL 109 102 103  CO2 23 26 24   GLUCOSE 102* 92 88  BUN 22* 12 19  CREATININE 0.58 0.69 0.64  CALCIUM 9.3 8.8* 8.6*  MG  --   --  1.9   GFR: Estimated Creatinine Clearance: 62.9 mL/min (by C-G formula based on SCr of 0.64 mg/dL). Liver Function Tests: Recent Labs  Lab 09/01/18 0520  AST 19  ALT 23  ALKPHOS 32*  BILITOT 0.6  PROT 5.5*  ALBUMIN 2.6*   No results for input(s): LIPASE, AMYLASE in the last 168 hours. No results for input(s): AMMONIA in the last 168 hours. Coagulation Profile: No results for input(s): INR, PROTIME in the last 168 hours. Cardiac Enzymes: No results for input(s): CKTOTAL, CKMB, CKMBINDEX, TROPONINI in the last 168 hours. BNP (last 3 results) No results for input(s): PROBNP in the last 8760 hours. HbA1C: No results for input(s): HGBA1C in the last 72 hours. CBG: No results for input(s): GLUCAP in the last 168 hours. Lipid Profile: No results for input(s): CHOL, HDL, LDLCALC, TRIG, CHOLHDL, LDLDIRECT in the last 72 hours. Thyroid Function Tests: No results for input(s): TSH, T4TOTAL, FREET4, T3FREE, THYROIDAB in the last 72 hours. Anemia Panel: No results for input(s): VITAMINB12, FOLATE, FERRITIN, TIBC, IRON,  RETICCTPCT in the last 72 hours. Sepsis Labs: No results for input(s): PROCALCITON, LATICACIDVEN in the last 168 hours.  No results found for this or any previous visit (from the past 240 hour(s)).       Radiology Studies: No results found.      Scheduled Meds: . atorvastatin  80 mg Oral Daily  . hydroxychloroquine  200 mg Oral Daily  . rivaroxaban  20 mg Oral Daily   Continuous Infusions:   LOS: 5 days    Time spent: over 30 min    Fayrene Helper, MD [facility] Pager AMION  If 7PM-7AM, please contact night-coverage [email or web address] Password [facility] 09/01/2018, 5:34 PM

## 2018-09-01 NOTE — Progress Notes (Addendum)
Subjective: Patient in bed, awake, alert, watching TV, NAD.  Objective: Current vital signs: BP 110/80 (BP Location: Left Arm)   Pulse 77   Temp 98.2 F (36.8 C) (Oral)   Resp 16   Ht 5\' 3"  (1.6 m)   Wt 48.4 kg   LMP 12/29/2001   SpO2 100%   BMI 18.90 kg/m  Vital signs in last 24 hours: Temp:  [97.5 F (36.4 C)-98.3 F (36.8 C)] 98.2 F (36.8 C) (03/15 0846) Pulse Rate:  [70-98] 77 (03/15 0846) Resp:  [15-16] 16 (03/15 0401) BP: (81-110)/(58-80) 110/80 (03/15 0846) SpO2:  [98 %-100 %] 100 % (03/15 0846)  Intake/Output from previous day: No intake/output data recorded. Intake/Output this shift: No intake/output data recorded. Nutritional status:  Diet Order            DIET DYS 2 Room service appropriate? Yes with Assist; Fluid consistency: Thin  Diet effective now             HEENT: [place]/AT Lungs: Respirations unlabored Cards: pulses palpable  Neurologic Exam: Ment: awake, alert oriented to name/age/. Will answer simple questions and follow commands. No dysarthria noted, but patient does not move her mouth much to speak which sometimes makes her speech incomprehensible. CN: PERRL.  EOMI , no  Nystagmus noted. Face symmetric.  Motor: 4/5 in all 4 extremities , still some effort dependency noted.  No asymmetry noted.  Sensory: light touch intact in all 4 extremities Reflexes: Brisk reflexes x 4  Cerebellar: Not cooperative.   Lab Results: Results for orders placed or performed during the hospital encounter of 08/27/18 (from the past 48 hour(s))  CBC     Status: Abnormal   Collection Time: 08/31/18 10:31 AM  Result Value Ref Range   WBC 10.8 (H) 4.0 - 10.5 K/uL   RBC 3.80 (L) 3.87 - 5.11 MIL/uL   Hemoglobin 11.9 (L) 12.0 - 15.0 g/dL   HCT 36.1 36.0 - 46.0 %   MCV 95.0 80.0 - 100.0 fL   MCH 31.3 26.0 - 34.0 pg   MCHC 33.0 30.0 - 36.0 g/dL   RDW 12.1 11.5 - 15.5 %   Platelets 122 (L) 150 - 400 K/uL   nRBC 0.0 0.0 - 0.2 %    Comment: Performed at [facility], [place] [address] [place], [place] [address]  Basic metabolic panel     Status: Abnormal   Collection Time: 08/31/18 10:31 AM  Result Value Ref Range   Sodium 136 135 - 145 mmol/L   Potassium 3.3 (L) 3.5 - 5.1 mmol/L   Chloride 102 98 - 111 mmol/L   CO2 26 22 - 32 mmol/L   Glucose, Bld 92 70 - 99 mg/dL   BUN 12 6 - 20 mg/dL   Creatinine, Ser 0.69 0.44 - 1.00 mg/dL   Calcium 8.8 (L) 8.9 - 10.3 mg/dL   GFR calc non Af Amer >60 >60 mL/min   GFR calc Af Amer >60 >60 mL/min   Anion gap 8 5 - 15    Comment: Performed at [facility], [place] [address], [place], [place] [address]  CBC     Status: Abnormal   Collection Time: 09/01/18  5:20 AM  Result Value Ref Range   WBC 11.5 (H) 4.0 - 10.5 K/uL   RBC 3.61 (L) 3.87 - 5.11 MIL/uL   Hemoglobin 11.6 (L) 12.0 - 15.0 g/dL   HCT 35.1 (L) 36.0 - 46.0 %   MCV 97.2 80.0 - 100.0  fL   MCH 32.1 26.0 - 34.0 pg   MCHC 33.0 30.0 - 36.0 g/dL   RDW 12.5 11.5 - 15.5 %   Platelets 138 (L) 150 - 400 K/uL    Comment: REPEATED TO VERIFY   nRBC 0.0 0.0 - 0.2 %    Comment: Performed at [facility], [place] [address], [place], [place] [address]  Comprehensive metabolic panel     Status: Abnormal   Collection Time: 09/01/18  5:20 AM  Result Value Ref Range   Sodium 136 135 - 145 mmol/L   Potassium 4.0 3.5 - 5.1 mmol/L   Chloride 103 98 - 111 mmol/L   CO2 24 22 - 32 mmol/L   Glucose, Bld 88 70 - 99 mg/dL   BUN 19 6 - 20 mg/dL   Creatinine, Ser 0.64 0.44 - 1.00 mg/dL   Calcium 8.6 (L) 8.9 - 10.3 mg/dL   Total Protein 5.5 (L) 6.5 - 8.1 g/dL   Albumin 2.6 (L) 3.5 - 5.0 g/dL   AST 19 15 - 41 U/L   ALT 23 0 - 44 U/L   Alkaline Phosphatase 32 (L) 38 - 126 U/L   Total Bilirubin 0.6 0.3 - 1.2 mg/dL   GFR calc non Af Amer >60 >60 mL/min   GFR calc Af Amer >60 >60 mL/min   Anion gap 9 5 - 15    Comment: Performed at [facility] [address] [place], [place] [address]  Magnesium     Status: None   Collection Time: 09/01/18  5:20 AM   Result Value Ref Range   Magnesium 1.9 1.7 - 2.4 mg/dL    Comment: Performed at [facility] [address], [place], [place] [address]    No results found for this or any previous visit (from the past 240 hour(s)).   Medications:  Scheduled: . atorvastatin  80 mg Oral Daily  . hydroxychloroquine  200 mg Oral Daily  . 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 -- She has been treated with IV Solu-Medrol-has completed 5/5 doses  -- Significantly improved since admission, and continuing to improve. -- Repeat MRI performed Tuesday night showed no definite change relative to the prior study performed on 3/7. Two punctate foci of reduced diffusion on the follow up study were not seen on the earlier study, most likely due to its lower resolution. Findings probably represent recent microvascular ischemia. No secondary findings of vasculitis, meningitis, or encephalitis. Stable moderate age advanced volume loss of the brainis also noted. -- Spot EEG revealed general background slowing, as may be seen with a metabolic encephalopathy, among other possibilities. No epileptiform activity was noted.  --LTM EEG showed no epileptiform discharges or EEG seizures on LTM.  Recommendations: 1. She has been treated with IV Solu-Medrol-has received5/5doses. Significantly improved since admission. However, still with significant cognitive slowing and disorientation. She had a severe encephalopathy prior to receiving steroids, now better classifiable as a moderate encephalopathy.  2. Recommend starting IVIG 0.4 g/kg qd for 5 days. Risk of blood clots is somewhat increased with this medication. Would hydrate well and monitor while on IVIG. Primary team to contact next of kin for final medical decision making as patient does not  demonstrate an ability to make rational/informed medical decisions based on her degree of cognitive impairment.  3.  Please call Neurohospitalist service if IVIG is started.      LOS: 5 days   Laurey Morale, MSN, NP-C [facility] [phone]  I have seen and examined the patient. I have formulated the assessment and plan.  Electronically signed: Dr. Kerney Elbe

## 2018-09-02 DIAGNOSIS — L8990 Pressure ulcer of unspecified site, unspecified stage: Secondary | ICD-10-CM

## 2018-09-02 LAB — COMPREHENSIVE METABOLIC PANEL
ALT: 23 U/L (ref 0–44)
AST: 20 U/L (ref 15–41)
Albumin: 2.6 g/dL — ABNORMAL LOW (ref 3.5–5.0)
Alkaline Phosphatase: 37 U/L — ABNORMAL LOW (ref 38–126)
Anion gap: 7 (ref 5–15)
BUN: 20 mg/dL (ref 6–20)
CO2: 27 mmol/L (ref 22–32)
Calcium: 8.7 mg/dL — ABNORMAL LOW (ref 8.9–10.3)
Chloride: 101 mmol/L (ref 98–111)
Creatinine, Ser: 0.74 mg/dL (ref 0.44–1.00)
GFR calc Af Amer: >60 mL/min (ref >60)
GFR calc non Af Amer: >60 mL/min (ref >60)
Glucose, Bld: 77 mg/dL (ref 70–99)
Potassium: 4 mmol/L (ref 3.5–5.1)
Sodium: 135 mmol/L (ref 135–145)
Total Bilirubin: 0.6 mg/dL (ref 0.3–1.2)
Total Protein: 5.8 g/dL — ABNORMAL LOW (ref 6.5–8.1)

## 2018-09-02 LAB — URINALYSIS, ROUTINE W REFLEX MICROSCOPIC
Bilirubin Urine: NEGATIVE
Glucose, UA: 50 mg/dL — AB
Hgb urine dipstick: NEGATIVE
Ketones, ur: 20 mg/dL — AB
Nitrite: NEGATIVE
Protein, ur: 30 mg/dL — AB
Specific Gravity, Urine: 1.026 (ref 1.005–1.030)
pH: 6 (ref 5.0–8.0)

## 2018-09-02 LAB — PROTEIN / CREATININE RATIO, URINE
Creatinine, Urine: 173.83 mg/dL
Protein Creatinine Ratio: 0.13 mg/mg{Cre} (ref 0.00–0.15)
Total Protein, Urine: 23 mg/dL

## 2018-09-02 LAB — CBC
HCT: 34.9 % — ABNORMAL LOW (ref 36.0–46.0)
Hemoglobin: 11 g/dL — ABNORMAL LOW (ref 12.0–15.0)
MCH: 30.7 pg (ref 26.0–34.0)
MCHC: 31.5 g/dL (ref 30.0–36.0)
MCV: 97.5 fL (ref 80.0–100.0)
Platelets: 147 10*3/uL — ABNORMAL LOW (ref 150–400)
RBC: 3.58 MIL/uL — ABNORMAL LOW (ref 3.87–5.11)
RDW: 12.5 % (ref 11.5–15.5)
WBC: 10.5 10*3/uL (ref 4.0–10.5)
nRBC: 0 % (ref 0.0–0.2)

## 2018-09-02 LAB — MAGNESIUM: Magnesium: 1.9 mg/dL (ref 1.7–2.4)

## 2018-09-02 LAB — HEPARIN LEVEL (UNFRACTIONATED): Heparin Unfractionated: 1.92 IU/mL — ABNORMAL HIGH (ref 0.30–0.70)

## 2018-09-02 LAB — APTT: aPTT: 34 seconds (ref 24–36)

## 2018-09-02 MED ORDER — HEPARIN (PORCINE) 25000 UT/250ML-% IV SOLN: 750.0000 [IU]/h | INTRAVENOUS | Status: DC

## 2018-09-02 NOTE — Progress Notes (Signed)
Physical Therapy Treatment Patient Details Name: Valerie Bell MRN: [id number] DOB: 04/24/1966 Today's Date: 09/02/2018    History of Present Illness Pt is a 53 y/o female admitted from [facility] secondary to [place]. MRI was negative for any acute findings. EEG- moderate diffuse encephalopathy. [facility] MRI- 2 punctate foci of reduced diffusion of corpus callosum and left posterior temporal cortex. PMH including but not limited to collagen vascular disease, Lupus, anxiety, spinal cord lesion.    PT Comments    Patient progressing with ambulation distance this session and able to manage with +1 A.  Continue to feel she is appropriate for CIR level rehab upon d/c.  PT to follow acutely.    Follow Up Recommendations  CIR     Equipment Recommendations  None recommended by PT    Recommendations for Other Services       Precautions / Restrictions Precautions Precautions: Fall Precaution Comments: seizure    Mobility  Bed Mobility Overal bed mobility: Needs Assistance Bed Mobility: Rolling;Sidelying to Sit Rolling: Mod assist Sidelying to sit: Mod assist;HOB elevated       General bed mobility comments: increased time, paused mid roll and covered up again despite cues to come up to sit; needed assist to re-initiate and move to sitting  Transfers Overall transfer level: Needs assistance Equipment used: Rolling walker (2 wheeled) Transfers: Sit to/from Stand Sit to Stand: Mod assist         General transfer comment: lifting assist to stand, cues for hand placement up from EOB and from 3:1 over toilet  Ambulation/Gait Ambulation/Gait assistance: Mod assist Gait Distance (Feet): 12 Feet(&15') Assistive device: Rolling walker (2 wheeled) Gait Pattern/deviations: Step-to pattern;Decreased stride length;Decreased dorsiflexion - right;Decreased dorsiflexion - left;Steppage     General Gait Details: assist for walker safety as stepping too far forward in walker, assist for balance,  demonstrating decreased dorsiflexion bilaterally with steppage to clear feet; gait to bathroom, then to sink to wash hands and around bed to chair   Stairs             Wheelchair Mobility    Modified Rankin (Stroke Patients Only)       Balance Overall balance assessment: Needs assistance Sitting-balance support: Feet supported Sitting balance-Leahy Scale: Fair Sitting balance - Comments: seated EOB no assist     Standing balance-Leahy Scale: Poor Standing balance comment: standing at sink to wash hands after toileting min A                            Cognition Arousal/Alertness: Awake/alert Behavior During Therapy: Flat affect Overall Cognitive Status: Impaired/Different from baseline Area of Impairment: Orientation                   Current Attention Level: Sustained Memory: Decreased short-term memory Following Commands: Follows one step commands inconsistently;Follows one step commands with increased time Safety/Judgement: Decreased awareness of deficits;Decreased awareness of safety   Problem Solving: Slow processing;Decreased initiation;Difficulty sequencing;Requires verbal cues;Requires tactile cues        Exercises      General Comments        Pertinent Vitals/Pain Faces Pain Scale: Hurts a little bit Pain Location: generalized with mobility Pain Descriptors / Indicators: Grimacing Pain Intervention(s): Monitored during session;Repositioned    Home Living                      Prior Function  PT Goals (current goals can now be found in the care plan section) Progress towards PT goals: Progressing toward goals    Frequency    Min 3X/week      PT Plan Current plan remains appropriate    Co-evaluation              AM-PAC PT "6 Clicks" Mobility   Outcome Measure  Help needed turning from your back to your side while in a flat bed without using bedrails?: A Lot Help needed moving from lying  on your back to sitting on the side of a flat bed without using bedrails?: A Lot Help needed moving to and from a bed to a chair (including a wheelchair)?: A Lot Help needed standing up from a chair using your arms (e.g., wheelchair or bedside chair)?: A Lot Help needed to walk in hospital room?: A Lot Help needed climbing 3-5 steps with a railing? : Total 6 Click Score: 11    End of Session Equipment Utilized During Treatment: Gait belt Activity Tolerance: Patient tolerated treatment well Patient left: with chair alarm set;in chair;with call bell/phone within reach Nurse Communication: Mobility status PT Visit Diagnosis: Other abnormalities of gait and mobility (R26.89);Muscle weakness (generalized) (M62.81)     Time: 4403-4742 PT Time Calculation (min) (ACUTE ONLY): 34 min  Charges:  $Gait Training: 8-22 mins $Therapeutic Activity: 8-22 mins                     [place] [phone] 09/02/2018    Reginia Naas 09/02/2018, 5:04 PM

## 2018-09-02 NOTE — Progress Notes (Addendum)
I discussed / reviewed the pharmacy note by Lavena Bullion, PharmD Candidate and I agree with the PharmD Candidate's  findings and plans as documented.  Nicole Cella, RPh Clinical Pharmacist Pager: [phone] 09/02/2018 2:54 PM  ANTICOAGULATION CONSULT NOTE  Pharmacy Consult for heparin Indication: h/o  DVT  Allergies  Allergen Reactions  . Ace Inhibitors Other (See Comments)    Angioedema of face    Patient Measurements: Height: 5\' 3"  (160 cm) Weight: 106 lb 11.2 oz (48.4 kg) IBW/kg (Calculated) : 52.4 Heparin Dosing Weight: 48.4 kg  Vital Signs: Temp: 98.1 F (36.7 C) (03/16 1147) Temp Source: Oral (03/16 1147) BP: 112/78 (03/16 1147) Pulse Rate: 84 (03/16 1147)  Labs: Recent Labs    08/31/18 1031 09/01/18 0520 09/02/18 0617  HGB 11.9* 11.6* 11.0*  HCT 36.1 35.1* 34.9*  PLT 122* 138* 147*  CREATININE 0.69 0.64 0.74    Estimated Creatinine Clearance: 62.9 mL/min (by C-G formula based on SCr of 0.74 mg/dL).   Medical History: Past Medical History:  Diagnosis Date  . Anxiety   . Collagen vascular disease ([place])   . DVT (deep venous thrombosis) ([place])   . Hypertension   . Lupus (systemic lupus erythematosus) ([place])   . Spinal cord lesion [facility])     Assessment: 53 year old female on Xarelto 20 mg for h/o DVT (05/2018). Last dose reported 3/16 at 0849. Needs to be switched to heparin in preparation for an LP. Pharmacy consulted for heparin dosing for VTE treatment.  Goal of Therapy:  Heparin level 0.3-0.7 units/ml Monitor platelets by anticoagulation protocol: Yes   Plan:  D/c xarelto Start heparin drip at 750 units/hr Check baseline aPPT and HL prior to starting heparin Start heparin drip 3/17 at 0900 F/u 6 hour heparin level Daily HL, and CBC  Lavena Bullion, PharmD Candidate 09/02/2018 2:07 PM

## 2018-09-02 NOTE — Progress Notes (Addendum)
Subjective: Patient states she feels overall better. She denies any pain, numbness/tingling, headache, vision or hearing changes. She has been eating and drinking without issues. She has not ambulated yet.   Her son is at bedside and reports that she had had significant improvement, however is not back to her baseline. She is usually completely oriented, quick, and completely independent.  Objective: Current vital signs: BP 108/72 (BP Location: Left Arm)   Pulse 82   Temp 98.5 F (36.9 C) (Oral)   Resp 18   Ht 5\' 3"  (1.6 m)   Wt 48.4 kg   LMP 12/29/2001   SpO2 99%   BMI 18.90 kg/m  Vital signs in last 24 hours: Temp:  [98.5 F (36.9 C)-98.9 F (37.2 C)] 98.5 F (36.9 C) (03/16 0400) Pulse Rate:  [74-82] 82 (03/16 0400) Resp:  [15-18] 18 (03/16 0400) BP: (107-112)/(72-79) 108/72 (03/16 0400) SpO2:  [99 %-100 %] 99 % (03/16 0400)  Intake/Output from previous day: No intake/output data recorded. Intake/Output this shift: No intake/output data recorded. Nutritional status:  Diet Order            DIET DYS 2 Room service appropriate? Yes with Assist; Fluid consistency: Thin  Diet effective now             HEENT: anicteric sclera Lungs: Respirations unlabored, CTAB on anterior lung fields Cards: RRR, pulses intact  Neurologic Exam: Ment: awake, alert oriented to person. Will answer simple questions and follow commands. No dysarthria noted.. CN: PERRL, EOMI, no ptosis; flat faces and would only furrow brow on command, does not move her mouth much to speak. Facial touch sensation intact. Hearing intact. Bil shoulder shrug intact.  Motor: 4/5 in all 4 extremities Sensory: light touch intact in all 4 extremities Cerebellar: no dysmetria noted.    Lab Results: Results for orders placed or performed during the hospital encounter of 08/27/18 (from the past 48 hour(s))  CBC     Status: Abnormal   Collection Time: 08/31/18 10:31 AM  Result Value Ref Range   WBC 10.8 (H) 4.0  - 10.5 K/uL   RBC 3.80 (L) 3.87 - 5.11 MIL/uL   Hemoglobin 11.9 (L) 12.0 - 15.0 g/dL   HCT 36.1 36.0 - 46.0 %   MCV 95.0 80.0 - 100.0 fL   MCH 31.3 26.0 - 34.0 pg   MCHC 33.0 30.0 - 36.0 g/dL   RDW 12.1 11.5 - 15.5 %   Platelets 122 (L) 150 - 400 K/uL   nRBC 0.0 0.0 - 0.2 %    Comment: Performed at [facility], [place] [address], [place], [place] [address]  Basic metabolic panel     Status: Abnormal   Collection Time: 08/31/18 10:31 AM  Result Value Ref Range   Sodium 136 135 - 145 mmol/L   Potassium 3.3 (L) 3.5 - 5.1 mmol/L   Chloride 102 98 - 111 mmol/L   CO2 26 22 - 32 mmol/L   Glucose, Bld 92 70 - 99 mg/dL   BUN 12 6 - 20 mg/dL   Creatinine, Ser 0.69 0.44 - 1.00 mg/dL   Calcium 8.8 (L) 8.9 - 10.3 mg/dL   GFR calc non Af Amer >60 >60 mL/min   GFR calc Af Amer >60 >60 mL/min   Anion gap 8 5 - 15    Comment: Performed at [facility], [place] [address] [place], [place] [address]  CBC     Status: Abnormal   Collection Time: 09/01/18  5:20  AM  Result Value Ref Range   WBC 11.5 (H) 4.0 - 10.5 K/uL   RBC 3.61 (L) 3.87 - 5.11 MIL/uL   Hemoglobin 11.6 (L) 12.0 - 15.0 g/dL   HCT 35.1 (L) 36.0 - 46.0 %   MCV 97.2 80.0 - 100.0 fL   MCH 32.1 26.0 - 34.0 pg   MCHC 33.0 30.0 - 36.0 g/dL   RDW 12.5 11.5 - 15.5 %   Platelets 138 (L) 150 - 400 K/uL    Comment: REPEATED TO VERIFY   nRBC 0.0 0.0 - 0.2 %    Comment: Performed at [facility], [place] [address] [place], [place] [address]  Comprehensive metabolic panel     Status: Abnormal   Collection Time: 09/01/18  5:20 AM  Result Value Ref Range   Sodium 136 135 - 145 mmol/L   Potassium 4.0 3.5 - 5.1 mmol/L   Chloride 103 98 - 111 mmol/L   CO2 24 22 - 32 mmol/L   Glucose, Bld 88 70 - 99 mg/dL   BUN 19 6 - 20 mg/dL   Creatinine, Ser 0.64 0.44 - 1.00 mg/dL   Calcium 8.6 (L) 8.9 - 10.3 mg/dL   Total Protein 5.5 (L) 6.5 - 8.1 g/dL   Albumin 2.6 (L) 3.5 - 5.0 g/dL   AST 19 15 - 41 U/L   ALT 23 0 - 44 U/L   Alkaline  Phosphatase 32 (L) 38 - 126 U/L   Total Bilirubin 0.6 0.3 - 1.2 mg/dL   GFR calc non Af Amer >60 >60 mL/min   GFR calc Af Amer >60 >60 mL/min   Anion gap 9 5 - 15    Comment: Performed at [facility] [address] [place], [place] [address]  Magnesium     Status: None   Collection Time: 09/01/18  5:20 AM  Result Value Ref Range   Magnesium 1.9 1.7 - 2.4 mg/dL    Comment: Performed at [facility] [address], [place], [place] [address]  CBC     Status: Abnormal   Collection Time: 09/02/18  6:17 AM  Result Value Ref Range   WBC 10.5 4.0 - 10.5 K/uL   RBC 3.58 (L) 3.87 - 5.11 MIL/uL   Hemoglobin 11.0 (L) 12.0 - 15.0 g/dL   HCT 34.9 (L) 36.0 - 46.0 %   MCV 97.5 80.0 - 100.0 fL   MCH 30.7 26.0 - 34.0 pg   MCHC 31.5 30.0 - 36.0 g/dL   RDW 12.5 11.5 - 15.5 %   Platelets 147 (L) 150 - 400 K/uL   nRBC 0.0 0.0 - 0.2 %    Comment: Performed at [facility], [place] [address] [place], [place] [address]  Comprehensive metabolic panel     Status: Abnormal   Collection Time: 09/02/18  6:17 AM  Result Value Ref Range   Sodium 135 135 - 145 mmol/L   Potassium 4.0 3.5 - 5.1 mmol/L   Chloride 101 98 - 111 mmol/L   CO2 27 22 - 32 mmol/L   Glucose, Bld 77 70 - 99 mg/dL   BUN 20 6 - 20 mg/dL   Creatinine, Ser 0.74 0.44 - 1.00 mg/dL   Calcium 8.7 (L) 8.9 - 10.3 mg/dL   Total Protein 5.8 (L) 6.5 - 8.1 g/dL   Albumin 2.6 (L) 3.5 - 5.0 g/dL   AST 20 15 - 41 U/L   ALT 23 0 - 44 U/L   Alkaline  Phosphatase 37 (L) 38 - 126 U/L   Total Bilirubin 0.6 0.3 - 1.2 mg/dL   GFR calc non Af Amer >60 >60 mL/min   GFR calc Af Amer >60 >60 mL/min   Anion gap 7 5 - 15    Comment: Performed at [facility] [address], [place], [place] [address]  Magnesium     Status: None   Collection Time: 09/02/18  6:17 AM  Result Value Ref Range   Magnesium 1.9 1.7 - 2.4 mg/dL    Comment: Performed at [facility] [address], [place], [place] [address]    No results found  for this or any previous visit (from the past 240 hour(s)).   Medications:  Scheduled: . atorvastatin  80 mg Oral Daily  . hydroxychloroquine  200 mg Oral Daily  . 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 -- She has been treated with IV Solu-Medrol-has completed 5/5 doses -- Significantly improved since admission, and continuing to improve however continued encephalopathy. -- Repeat MRI performed Tuesday night showed no definite change relative to the prior study performed on 3/7. Two punctate foci of reduced diffusion on the follow up study were not seen on the earlier study, most likely due to its lower resolution. Findings probably represent recent microvascular ischemia. No secondary findings of vasculitis, meningitis, or encephalitis. Stable moderate age advanced volume loss of the brainis also noted. -- Spot EEG revealed general background slowing, as may be seen with a metabolic encephalopathy, among other possibilities. No epileptiform activity was noted.  --LTM EEG showed no epileptiform discharges or EEG seizures on LTM.  Recommendations:  1. Recommend starting IVIG 0.4 g/kg qd for 5 days. 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 Would hydrate well and monitor while on IVIG. Son is next of kin and in agreement with treatment. 2. Continue Plaquenil 3. Continue PT/OT   Alphonzo Grieve, MD PGY3   LOS: 6 days   I have seen the patient reviewed the note of Dr. Jari Favre.  On exam, she has some perseveration, but is able to follow commands and answer simple questions such as identifying her son.  Apparently at baseline she is fairly functional, and independent.  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  Possible treatments moving forward are varied, there is some evidence for plasma exchange, IVIG has also been used, as has IV Cytoxan.  I do think that consultation with rheumatology would be invaluable.  Lumbar puncture may be useful, though low-grade pleocytosis can be seen with lupus even in between flares and a normal CSF I do not think would rule out autoimmunity as a cause of her symptoms.   At this point, I think that she may be better served at an academic center with rheumatology availability. If this is not possible, then would consider IVIG.   Roland Rack, MD [facility] [phone]  If 7pm- 7am, please page neurology on call as listed in [place].

## 2018-09-02 NOTE — Progress Notes (Signed)
IP rehab admissions - Noted that patient recommended to have IVIG for 5 days.  Not ready for CIR at this time.  I will continue to follow progress.  Call me for questions.  [phone]

## 2018-09-02 NOTE — Progress Notes (Addendum)
PROGRESS NOTE    [facility]erie [facility]  DIY:[id number] DOB: 1966/06/01 DOA: 08/27/2018 PCP: [facility]   Brief Narrative:  53yo w/ [facility] history of anxiety, DVT, hypertension, and lupus who was admitted to [facility] with altered mental status. 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 She was transferred to [facility] for continuous EEG monitoring.  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  "Findings probably represent recent microvascular ischemia of uncertain etiology.  No secondary findings of vasculitis, meningitis, or encephalitis."    Spot EEG with generalized slowing Overnight EEG with moderate diffuse encephalopathy Per discussion with rheum at [facility] on 3/16, recommending repeat anti ds DNA, C3, C4, UA, and UP/C Also recommending LP - will transition back to heparin gtt, last dose of xarelto 3/16 AM  Working diagnosis lupus cerebritis.  She improved with 5 days of solumedrol, but is still encephalopathic.  Per son, [facility], she's independent at baseline and has significantly improved after steroids, but still not to her baseline. On discussion with neurology here today, recommending discussion with facility with rheum in house for possible transfer.  Discussed with rheum at [facility] as noted above.  Neurology at [facility] has accepted for transfer  (tomorrow 3/17).  Though in looking through the chart [facility] bit more, it seems like this pt has been cared for by neurology at [facility].  Will attempt to transfer to [facility] instead.  Addendum: Discussed with transfer Bell at [facility].  Discussed with on call neurologist.  No beds and apparently unable to place pt on waitlist due to lack of emergent indication for transfer.    Cutaneous Lupus: continue plaquenil.  Will need outpatient follow up.  She has wounds on her buttocks that according to her family are related to lupus flare.   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  She was treated with IV steroids.  From follow up visit in 02/2018 at [facility] it looks like differential included inflammatory/autoimmune/vascular disease/malignancy.  She had repeat MRI on 9/12 which showed "near compete resolution of previously seen intramedullary lesion with minimal residual T2/STIR hyperintense signal at the T2-T3 level, likely related to the patient's lupus.  Monoclonal Gammopathy: follows with heme at [facility]  Dysphagia: nursing concern with ability to swallow today.  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   continue to monitor, stable  Leukocytosis: mild, improving, follow  DVT prophylaxis: xarelto Code Status: full  Family Communication: [facility] Disposition Plan: consideration of IVIG after  discussion with next of kin  Consultants:   neurology  Procedures:  Overnight EEG Impression: This EEG shows evidence of [facility] moderate diffuse encephalopathy. No epileptiform discharges or EEG seizures were  recorded.   EEG IMPRESSION: This is [facility] technically difficult record due to the predominance of muscle and movement artifact.  When able to be evaluated this record is dominated by general background slowing.  This finding may be seen with [facility] diffuse disturbance that is etiologically nonspecific, but may include [facility] metabolic encephalopathy, among other possibilities.  No epileptiform activity was noted.    Antimicrobials:  Anti-infectives (From admission, onward)   Start     Dose/Rate Route Frequency Ordered Stop   08/30/18 1300  hydroxychloroquine (PLAQUENIL) tablet 200 mg     200 mg Oral Daily 08/30/18 1159       Subjective: [facility] today. Son at bedside today. Notes she's better, but not to baseline.  At baseline independent.  Objective: Vitals:   09/01/18 1728 09/01/18 2000 09/02/18 0000 09/02/18 0400  BP: 107/76 112/76 110/74 108/72  Pulse: 74 76 78 82  Resp: _0 Temp: 98.5 F (36.9 C) 98.7 F (37.1 C) 98.8 F (37.1 C) 98.5 F (36.9 C)  TempSrc: Axillary Oral Oral Oral  SpO2: 99% 100% 100% 99%  Weight:      Height:       No intake or output data in the 24 hours ending 09/02/18 1506 Filed Weights   08/27/18 1650  Weight: 48.4 kg    Examination:  General: No acute distress. Cardiovascular: Heart sounds show Tramain Gershman regular rate, and rhythm.  Lungs: Clear to auscultation bilaterally Abdomen: Soft, nontender, nondistended Neurological: Alert and oriented 2. Moves all extremities 4. Slow to respond. Skin: Warm and dry. No rashes or lesions. Extremities: No clubbing or cyanosis. No edema.     Data Reviewed: I have personally reviewed following labs and imaging studies  CBC: Recent Labs  Lab 08/27/18 0455  08/29/18 0415 08/30/18 0531 08/31/18 1031 09/01/18 0520 09/02/18 0617  WBC 14.5*   < > 13.5* 11.5* 10.8* 11.5* 10.5  NEUTROABS 13.0*  --   --   --   --   --   --   HGB 11.7*   < > 11.2* 11.1* 11.9* 11.6* 11.0*  HCT 36.7   < > 33.3* 33.3* 36.1 35.1*  34.9*  MCV 97.9   < > 95.1 95.4 95.0 97.2 97.5  PLT 123*   < > 116* 98* 122* 138* 147*   < > = values in this interval not displayed.   Basic Metabolic Panel: Recent Labs  Lab 08/28/18 0631 08/31/18 1031 09/01/18 0520 09/02/18 0617  NA 140 136 136 135  K 3.6 3.3* 4.0 4.0  CL 109 102 103 101  CO2 _1 GLUCOSE 102* 92 88 77  BUN 22* _2 CREATININE 0.58 0.69 0.64 0.74  CALCIUM 9.3 8.8* 8.6* 8.7*  MG  --   --  1.9 1.9   GFR: Estimated Creatinine Clearance: 62.9 mL/min (by C-G formula based on SCr of 0.74 mg/dL). Liver Function Tests: Recent Labs  Lab 09/01/18 0520 09/02/18 0617  AST 19 20  ALT 23 23  ALKPHOS 32* 37*  BILITOT 0.6 0.6  PROT 5.5* 5.8*  ALBUMIN 2.6* 2.6*   No results for input(s): LIPASE, AMYLASE in the last 168 hours. No results for input(s): AMMONIA in the last 168 hours. Coagulation Profile: No results for input(s): INR, PROTIME in the  last 168 hours. Cardiac Enzymes: No results for input(s): CKTOTAL, CKMB, CKMBINDEX, TROPONINI in the last 168 hours. BNP (last 3 results) No results for input(s): PROBNP in the last 8760 hours. HbA1C: No results for input(s): HGBA1C in the last 72 hours. CBG: No results for input(s): GLUCAP in the last 168 hours. Lipid Profile: No results for input(s): CHOL, HDL, LDLCALC, TRIG, CHOLHDL, LDLDIRECT in the last 72 hours. Thyroid Function Tests: No results for input(s): TSH, T4TOTAL, FREET4, T3FREE, THYROIDAB in the last 72 hours. Anemia Panel: No results for input(s): VITAMINB12, FOLATE, FERRITIN, TIBC, IRON, RETICCTPCT in the last 72 hours. Sepsis Labs: No results for input(s): PROCALCITON, LATICACIDVEN in the last 168 hours.  No results found for this or any previous visit (from the past 240 hour(s)).       Radiology Studies: No results found.      Scheduled Meds: . atorvastatin  80 mg Oral Daily  . hydroxychloroquine  200 mg Oral Daily   Continuous Infusions: . [START ON 09/03/2018]  heparin       LOS: 6 days    Time spent: over 30 min    Fayrene Helper, MD [facility] Pager AMION  If 7PM-7AM, please contact night-coverage [email or web address] Password [facility] 09/02/2018, 3:06 PM

## 2018-09-02 NOTE — Progress Notes (Signed)
  Speech Language Pathology Treatment: Dysphagia;Cognitive-Linquistic  Patient Details Name: [facility] MRN: [id number] DOB: [birth year] Today's Date: 09/02/2018 Time: 1120-1140 SLP Time Calculation (min) (ACUTE ONLY): 20 min  Assessment / Plan / Recommendation Clinical Impression  Pt seen for diet tolerance/advancement and cognitive-linguistic tx targeting safety awareness, sustained attention and basic naming. Regular solid, puree and thin liquid snack provided for pt to set-up and consume. She tolerated snack with no overt s/sx of aspiration, but was noted to take impulsively large bites of cracker. pt unable to give reasoning as to why taking smaller bites would be preferred and so SLP raised awareness regarding this safety precaution. Problem solving abilities have improved since initial evaluation (3/12), but pt required max verbal/question cues to ask for help during snack set-up. During intake pt demonstrated fair sustained attention, but during naming and verbal functional task pt required max verbal, question and gestural cues to attend. She named 6 basic objects given max semantic, phonemic, letter, written, and sentence completion cues. She required max processing time to complete all tasks presented. Recommend upgrade to dys 3, thin liquid diet with full supervision to monitor alertness and safety. Will f/u to target attention, awareness and basic naming in functional tasks.   HPI HPI:  [facility]erie [facility] is a 53 y.o. female with documented history of anxiety, collagen vascular disease, DVT, hypertension, lupus, presenting to the hospital as a transfer from [facility], where she was initially seen by neurology for altered mental status. According to the initial neurology consult dictated by Dr. Orlena Sheldon, the patient has had lupus for 2 years and was brought into [facility] because she was having difficulty walking and had episode of staring and unresponsiveness during  which time she was also nonverbal.  There was no reported head injury.  MRI of the brain was performed which was unremarkable. At [facility], patient was started on IV Solu-Medrol for presumptive lupus cerebritis and has completed 4 days of IV Solu-Medrol and per progress notes from Dr. Orlena Sheldon and Dr. Doy Mince, is doing better than before.  It is unclear what her baseline is at this time. Pt seen by SLP at [facility] on 08/27/18, found to have rigidity but automatic response to tactile cues with PO, no signs of aspiration, started on dys 2/thin liquids. Seen by SLP at [facility] on 08/28/18 with improvement in function and regular/thin was initiated. SLP reconsulted re: swallow d/t pt pocketing pills and not initiating swallow.      SLP Plan  Continue with current plan of care       Recommendations  Diet recommendations: Dysphagia 3 (mechanical soft);Thin liquid Liquids provided via: Cup Medication Administration: Whole meds with puree Supervision: Full supervision/cueing for compensatory strategies Compensations: Slow rate;Small sips/bites Postural Changes and/or Swallow Maneuvers: Seated upright 90 degrees                Oral Care Recommendations: Oral care BID Follow up Recommendations: Inpatient Rehab SLP Visit Diagnosis: Dysphagia, oral phase (R13.11);Cognitive communication deficit (Z00.174) Plan: Continue with current plan of care       [place], Student SLP 09/02/2018, 1:13 PM

## 2018-09-02 NOTE — Consult Note (Addendum)
[place] Nurse wound consult note Reason for Consult: Buttocks with patchy areas of white round partial thickness skin loss, located near upper buttocks.  Each is approx .2X.2X.1cm; there are NOT pressure injuries.  Family states "these are sores related to a lupus flare up." They use barrier cream at home.  Dressing procedure/placement/frequency: Foam dressing to protect from further injury. Discussed plan of care with family at the bedside. Please re-consult if further assistance is needed.  Thank-you,  Julien Girt MSN, [place], [place], [place], [place]

## 2018-09-03 LAB — COMPREHENSIVE METABOLIC PANEL
ALT: 28 U/L (ref 0–44)
AST: 28 U/L (ref 15–41)
Albumin: 2.8 g/dL — ABNORMAL LOW (ref 3.5–5.0)
Alkaline Phosphatase: 37 U/L — ABNORMAL LOW (ref 38–126)
Anion gap: 9 (ref 5–15)
BUN: 11 mg/dL (ref 6–20)
CO2: 25 mmol/L (ref 22–32)
Calcium: 9 mg/dL (ref 8.9–10.3)
Chloride: 101 mmol/L (ref 98–111)
Creatinine, Ser: 0.7 mg/dL (ref 0.44–1.00)
GFR calc Af Amer: >60 mL/min (ref >60)
GFR calc non Af Amer: >60 mL/min (ref >60)
Glucose, Bld: 103 mg/dL — ABNORMAL HIGH (ref 70–99)
Potassium: 4.2 mmol/L (ref 3.5–5.1)
Sodium: 135 mmol/L (ref 135–145)
Total Bilirubin: 0.6 mg/dL (ref 0.3–1.2)
Total Protein: 6.2 g/dL — ABNORMAL LOW (ref 6.5–8.1)

## 2018-09-03 LAB — C3 COMPLEMENT: C3 Complement: 111 mg/dL (ref 82–167)

## 2018-09-03 LAB — CBC
HCT: 35.9 % — ABNORMAL LOW (ref 36.0–46.0)
Hemoglobin: 11.5 g/dL — ABNORMAL LOW (ref 12.0–15.0)
MCH: 31.3 pg (ref 26.0–34.0)
MCHC: 32 g/dL (ref 30.0–36.0)
MCV: 97.6 fL (ref 80.0–100.0)
Platelets: 173 10*3/uL (ref 150–400)
RBC: 3.68 MIL/uL — ABNORMAL LOW (ref 3.87–5.11)
RDW: 12.3 % (ref 11.5–15.5)
WBC: 11.1 10*3/uL — ABNORMAL HIGH (ref 4.0–10.5)
nRBC: 0 % (ref 0.0–0.2)

## 2018-09-03 LAB — C4 COMPLEMENT: Complement C4, Body Fluid: 34 mg/dL (ref 14–44)

## 2018-09-03 LAB — ANTI-DNA ANTIBODY, DOUBLE-STRANDED: ds DNA Ab: <1 [IU]/mL (ref 0–9)

## 2018-09-03 LAB — MAGNESIUM: Magnesium: 2 mg/dL (ref 1.7–2.4)

## 2018-09-03 NOTE — Progress Notes (Signed)
Pt discharge via [facility] to [facility]. Discharge summary was given to [facility] transport.

## 2018-09-03 NOTE — Progress Notes (Addendum)
Spoke with Angela Nevin the transfer RN from [facility], she was calling for an update on patient transfer status.   She can be reached at [phone] if there are any additional questions.  Called report to [facility] [phone]) who will be receiving the patient in [place].  Arbie Cookey, RN transfer coordinator is arranging transportation at this time for the patient.

## 2018-09-03 NOTE — Discharge Summary (Addendum)
Physician Discharge Summary  Valerie Bell:[id number] DOB: 04/09/1966 DOA: 08/27/2018  PCP: [facility]  Admit date: 08/27/2018 Discharge date: 09/03/2018  Time spent: 45 minutes  Recommendations for Follow-up at [facility]:  1. Follow up with neurology 2. Please consult rheumatology 3. 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 She was transferred to [facility] for continuous EEG monitoring.  She improved with IV solumedrol x 5 doses, but is still not back to baseline.  Workup as noted below.  Due to partial response to steroids, neurology with high suspicion for lupus cerebritis and recommended [facility] to [facility] center with rheumatology.  [facility] accepted pt for transfer.  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  "Findings probably represent recent microvascular ischemia of uncertain etiology.  No secondary findings of vasculitis, meningitis, or  encephalitis."    Spot EEG with generalized slowing Overnight EEG with moderate diffuse encephalopathy Per discussion with rheum at [facility] on 3/16, recommending repeat anti ds DNA (pending), C3 (111), C4 (34), UA (with 30 mg/dl protein, 11-20 RBC, and UP/C (0.13). Also recommending LP - will transition back to heparin gtt, last dose of xarelto 3/16 AM  Working diagnosis lupus cerebritis.  She improved with 5 days of solumedrol, but is still encephalopathic.  Per son, [name], she's independent at baseline and has significantly improved after steroids, but still not to her baseline. On discussion with neurology here today, high index of suspicion for lupus cerebritis given partial response to steroids. Recommending discussion with facility with rheum in house for possible transfer.  Discussed with rheum at [facility] as noted above.  Neurology at [facility] has accepted for transfer [name] accepting physician, appreciate assistance) (tomorrow 3/17).  Though in looking through the chart Rushawn Capshaw bit more, it seems like this pt has been cared for by neurology at [facility].  Will attempt to transfer to [facility] instead.  Addendum: Discussed with transfer center at [facility] on 3/16.  Discussed with on call neurologist.  No beds and apparently unable to place pt on waitlist due to lack of emergent indication for transfer.   Discussed possible transfer with son [name] ([phone]) on 3/16 at bedside.  He was not present at time of transfer and did not answer phone (no answering service).  Discussed with Mariann Laster [phone]) Sahara Fujimoto friend who has been present the past few days today (3/17) to notify her of transfer and asked her to let [facility] know as well.   Cutaneous Lupus: continue plaquenil.  Will need outpatient follow up.  She has wounds on her buttocks that according to her family are related to lupus flare.   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  She was treated with  IV steroids.  From follow up visit in 02/2018 at [facility] it looks like differential included inflammatory/autoimmune/vascular disease/malignancy.  She had repeat MRI on 9/12 which showed "near compete resolution of previously seen intramedullary lesion with minimal residual T2/STIR hyperintense signal at the T2-T3 level, likely related to the patient's lupus.  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   continue to monitor, stable  Leukocytosis: mild, improving, follow  Procedures: Overnight EEG Impression:This EEG shows evidence of Gaberiel Youngblood moderate diffuse encephalopathy. No epileptiform discharges or EEG seizures were recorded.  EEG IMPRESSION: This isa technically difficult record due to the predominance of muscle and movement artifact. When able to be evaluated this record is dominated by general background slowing. This finding may be seen with Bernadetta Roell diffuse disturbance that is etiologically nonspecific, but may include Jill Ruppe metabolic encephalopathy, among other possibilities. No epileptiform activity was noted.   Consultations:  neurology  Discharge Exam: Vitals:   09/03/18 0400 09/03/18 0631  BP: 108/75 (!) 135/94  Pulse: 67 63  Resp: 18 18  Temp: 98.2 F (36.8 C) 97.6 F (36.4 C)  SpO2: 100% 100%   Sleepy.  Awakens, but Kendrell Lottman bit confused. No complaints.  General: No acute distress. Cardiovascular: Heart sounds show Clark Cuff regular rate, and rhythm Lungs: Clear to auscultation bilaterally Abdomen:  Soft, nontender, nondistended  Neurological:  Sleepy, just woke up, slow to respond. Moves all extremities 4. Skin: Warm and dry. No rashes or lesions. Psychiatric: Mood and affect are normal. Insight and judgment are impaired.  Discharge Instructions   Discharge Instructions    Diet - low sodium heart healthy   Complete by:  As directed    Discharge instructions   Complete by:  As directed    Follow up with neurology at [facility] and Rheumatology   Increase activity slowly   Complete by:  As directed      Allergies as of 09/03/2018      Reactions   Ace Inhibitors Other (See Comments)   Angioedema of face      Medication List    STOP taking these medications   hydrALAZINE 25 MG tablet Commonly known as:  APRESOLINE   losartan 100 MG tablet Commonly known as:  COZAAR   methylPREDNISolone sodium succinate 1,000 mg in sodium chloride 0.9 % 50 mL   spironolactone 25 MG tablet Commonly known as:  ALDACTONE   XARELTO STARTER PACK PO     TAKE these medications   atorvastatin 80 MG tablet Commonly known as:  LIPITOR Take 80 mg by mouth daily.   [employer] Cortisone Maximum Strength 1 % ointment Generic drug:  hydrocortisone Apply 1 application topically 2 (two) times daily.   heparin 25000-0.45 UT/250ML-% infusion Inject 700 Units/hr into the vein continuous.   hydroxychloroquine 200 MG tablet Commonly known as:  PLAQUENIL  Take 200 mg by mouth daily.      Allergies  Allergen Reactions  . Ace Inhibitors Other (See Comments)    Angioedema of face      The results of significant diagnostics from this hospitalization (including imaging, microbiology, ancillary and laboratory) are listed below for reference.    Significant Diagnostic Studies: Ct Angio Head W Or Wo Contrast  Result Date: 08/24/2018 CLINICAL DATA:  Altered mental status EXAM: CT ANGIOGRAPHY HEAD AND NECK TECHNIQUE: Multidetector CT imaging of the head and neck was performed using the standard protocol  during bolus administration of intravenous contrast. Multiplanar CT image reconstructions and MIPs were obtained to evaluate the vascular anatomy. Carotid stenosis measurements (when applicable) are obtained utilizing NASCET criteria, using the distal internal carotid diameter as the denominator. CONTRAST:  27m OMNIPAQUE IOHEXOL 350 MG/ML SOLN COMPARISON:  Brain MRI and head CT from yesterday FINDINGS: CTA NECK FINDINGS Aortic arch: Negative.  Three vessel branching Right carotid system: Vessels are smooth and widely patent. No atheromatous changes. Tortuosity. Left carotid system: Vessels are smooth and widely patent. No atheromatous changes. Tortuosity. Vertebral arteries: No proximal subclavian stenosis. Both vertebral arteries are smooth and widely patent to the dura. Skeleton: Negative Other neck: Negative Upper chest: Negative Review of the MIP images confirms the above findings CTA HEAD FINDINGS Anterior circulation: No atheromatous changes, beading, aneurysm, or stenosis Posterior circulation: No atheromatous changes, beading, aneurysm, or stenosis Venous sinuses: Patent Anatomic variants: None significant Delayed phase: No abnormal intracranial enhanced Review of the MIP images confirms the above findings IMPRESSION: Negative CTA of the head and neck Electronically Signed   By: JMonte FantasiaM.D.   On: 08/24/2018 11:51   Ct Head Wo Contrast  Result Date: 08/23/2018 CLINICAL DATA:  Altered mental status since 8 Zelda Reames.m. today. EXAM: CT HEAD WITHOUT CONTRAST TECHNIQUE: Contiguous axial images were obtained from the base of the skull through the vertex without intravenous contrast. COMPARISON:  12/20/2017. FINDINGS: Brain: Mildly enlarged ventricles and cortical sulci. Minimal patchy white matter low density in both cerebral hemispheres. No intracranial hemorrhage, mass lesion or CT evidence of acute infarction. Vascular: No hyperdense vessel or unexpected calcification. Skull: Normal. Negative for fracture  or focal lesion. Sinuses/Orbits: Unremarkable. Other: None. IMPRESSION: 1. No acute abnormality. 2. Minimal atrophy and minimal chronic small vessel white matter ischemic changes in both cerebral hemispheres. Electronically Signed   By: SClaudie ReveringM.D.   On: 08/23/2018 18:50   Ct Angio Neck W Or Wo Contrast  Result Date: 08/24/2018 CLINICAL DATA:  Altered mental status EXAM: CT ANGIOGRAPHY HEAD AND NECK TECHNIQUE: Multidetector CT imaging of the head and neck was performed using the standard protocol during bolus administration of intravenous contrast. Multiplanar CT image reconstructions and MIPs were obtained to evaluate the vascular anatomy. Carotid stenosis measurements (when applicable) are obtained utilizing NASCET criteria, using the distal internal carotid diameter as the denominator. CONTRAST:  712mOMNIPAQUE IOHEXOL 350 MG/ML SOLN COMPARISON:  Brain MRI and head CT from yesterday FINDINGS: CTA NECK FINDINGS Aortic arch: Negative.  Three vessel branching Right carotid system: Vessels are smooth and widely patent. No atheromatous changes. Tortuosity. Left carotid system: Vessels are smooth and widely patent. No atheromatous changes. Tortuosity. Vertebral arteries: No proximal subclavian stenosis. Both vertebral arteries are smooth and widely patent to the dura. Skeleton: Negative Other neck: Negative Upper chest: Negative Review of the MIP images confirms the above findings CTA HEAD FINDINGS Anterior circulation: No atheromatous changes, beading, aneurysm, or stenosis Posterior circulation: No atheromatous  changes, beading, aneurysm, or stenosis Venous sinuses: Patent Anatomic variants: None significant Delayed phase: No abnormal intracranial enhanced Review of the MIP images confirms the above findings IMPRESSION: Negative CTA of the head and neck Electronically Signed   By: Monte Fantasia M.D.   On: 08/24/2018 11:51   Mr Brain Wo Contrast  Result Date: 08/24/2018 CLINICAL DATA:  Altered mental  status. History of lupus, hypertension. EXAM: MRI HEAD WITHOUT CONTRAST TECHNIQUE: Multiplanar, multiecho pulse sequences of the brain and surrounding structures were obtained without intravenous contrast. COMPARISON:  CT HEAD August 23, 2018 FINDINGS: Multiple sequences are moderately or severely motion degraded. INTRACRANIAL CONTENTS: No reduced diffusion to suggest acute ischemia or hyperacute demyelination. No lobar hematoma, though blood sensitive sequence is moderately motion degraded limiting sensitivity for microhemorrhage. Moderate global parenchymal brain volume loss. No hydrocephalus. No hydrocephalus. No suspicious parenchymal signal, masses, mass effect. No abnormal extra-axial fluid collections. No extra-axial masses. VASCULAR: Normal major intracranial vascular flow voids present at skull base. SKULL AND UPPER CERVICAL SPINE: No abnormal sellar expansion. No suspicious calvarial bone marrow signal. Craniocervical junction maintained. SINUSES/ORBITS: The mastoid air-cells and included paranasal sinuses are well-aerated.The included ocular globes and orbital contents are non-suspicious. OTHER: None. IMPRESSION: 1. No acute intracranial process on this motion degraded examination. 2. Moderate parenchymal brain volume loss, advanced for age. Electronically Signed   By: Elon Alas M.D.   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 EXAM: MRI HEAD WITHOUT AND WITH CONTRAST TECHNIQUE: Multiplanar, multiecho pulse sequences of the brain and surrounding structures were obtained without and with intravenous contrast. CONTRAST:  5 cc Gadavist COMPARISON:  08/24/2018 CTA of the head. 08/23/2018 CT head and MRI head. FINDINGS: Brain: Motion degradation of several sequences. 2 punctate foci of reduced diffusion are present within the body of corpus callosum and  left posterior temporal cortex (series 5 image 70 and 80). Few stable nonspecific T2 hyperintensities are present in periventricular white matter and predominantly the frontal lobes. Moderate diffuse age advanced volume loss of the brain with relative cerebellar sparing. No extra-axial collection, hemorrhage, hydrocephalus, mass effect, or herniation. After administration of intravenous contrast there is no abnormal enhancement. Vascular: Normal flow voids. Skull and upper cervical spine: Normal marrow signal. Sinuses/Orbits: Mild-to-moderate mucosal thickening the left-sided ethmoid air cells, sphenoid sinus, the maxillary sinus. No significant abnormal signal of the mastoid air cells. Orbits are unremarkable. Other: None. IMPRESSION: Motion degradation of several sequences. 1. Two punctate foci of reduced diffusion are present in the body of corpus callosum and left posterior temporal cortex. Findings probably represent recent microvascular ischemia of uncertain etiology. No secondary findings of vasculitis, meningitis, or encephalitis. 2. Stable moderate age advanced volume loss of the brain. 3. Left-sided paranasal sinus disease. Electronically Signed   By: Kristine Garbe M.D.   On: 08/27/2018 23:35   US Venous Img Lower Unilateral Right  Result Date: 08/19/2018 CLINICAL DATA:  History of right lower extremity DVT, now with worsening right lower extremity pain and edema. Evaluate for acute or chronic DVT EXAM: RIGHT LOWER EXTREMITY VENOUS DOPPLER ULTRASOUND TECHNIQUE: Gray-scale sonography with graded compression, as well as color Doppler and duplex ultrasound were performed to evaluate the lower extremity deep venous systems from the level of the common femoral vein and including the common femoral, femoral, profunda femoral, popliteal and calf veins including the posterior tibial, peroneal and gastrocnemius  veins when visible. The superficial great saphenous vein was also interrogated. Spectral  Doppler was utilized to evaluate flow at rest and with distal augmentation maneuvers in the common femoral, femoral and popliteal veins. COMPARISON:  Right lower extremity venous Doppler ultrasound-06/14/2018 FINDINGS: Contralateral Common Femoral Vein: Respiratory phasicity is normal and symmetric with the symptomatic side. No evidence of thrombus. Normal compressibility. Common Femoral Vein: There is grossly unchanged mixed echogenic nonocclusive wall thickening/chronic DVT within the right common femoral vein (image 10), similar to the 05/2018 examination. Saphenofemoral Junction: There is echogenic occlusive thrombus involving the right saphenofemoral junction (image 13), grossly unchanged compared to the 05/2018 examination. Profunda Femoral Vein: Appears patent where imaged. Femoral Vein: There is echogenic occlusive thrombus involving the proximal (image 19), mid (image 22) and distal (image 26) aspects of the right femoral vein, similar to the 05/2018 examination. Popliteal Vein: There is hypoechoic near occlusive wall thickening/chronic DVT involving the right popliteal vein (images 29 and 31), unchanged to slightly improved compared to the 05/2018 examination. Calf Veins: There is hypoechoic occlusive thrombus involving both paired right posterior tibial as well as the right peroneal veins, grossly unchanged compared to the 05/2018 examination. Superficial Great Saphenous Vein: No evidence of thrombus. Normal compressibility. Venous Reflux:  None. Other Findings:  None. IMPRESSION: Grossly unchanged extensive predominantly occlusive DVT extending from the right common femoral vein through the right tibial veins, similar to the 05/2018 examination. Electronically Signed   By: Sandi Mariscal M.D.   On: 08/19/2018 12:30    Microbiology: No results found for this or any previous visit (from the past 240 hour(s)).   Labs: Basic Metabolic Panel: Recent Labs  Lab 08/28/18 0631 08/31/18 1031 09/01/18 0520  09/02/18 0617 09/03/18 0451  NA 140 136 136 135 135  K 3.6 3.3* 4.0 4.0 4.2  CL 109 102 103 101 101  CO2 '23 26 24 27 25  '$ GLUCOSE 102* 92 88 77 103*  BUN 22* '12 19 20 11  '$ CREATININE 0.58 0.69 0.64 0.74 0.70  CALCIUM 9.3 8.8* 8.6* 8.7* 9.0  MG  --   --  1.9 1.9 2.0   Liver Function Tests: Recent Labs  Lab 09/01/18 0520 09/02/18 0617 09/03/18 0451  AST '19 20 28  '$ ALT '23 23 28  '$ ALKPHOS 32* 37* 37*  BILITOT 0.6 0.6 0.6  PROT 5.5* 5.8* 6.2*  ALBUMIN 2.6* 2.6* 2.8*   No results for input(s): LIPASE, AMYLASE in the last 168 hours. No results for input(s): AMMONIA in the last 168 hours. CBC: Recent Labs  Lab 08/30/18 0531 08/31/18 1031 09/01/18 0520 09/02/18 0617 09/03/18 0451  WBC 11.5* 10.8* 11.5* 10.5 11.1*  HGB 11.1* 11.9* 11.6* 11.0* 11.5*  HCT 33.3* 36.1 35.1* 34.9* 35.9*  MCV 95.4 95.0 97.2 97.5 97.6  PLT 98* 122* 138* 147* 173   Cardiac Enzymes: No results for input(s): CKTOTAL, CKMB, CKMBINDEX, TROPONINI in the last 168 hours. BNP: BNP (last 3 results) No results for input(s): BNP in the last 8760 hours.  ProBNP (last 3 results) No results for input(s): PROBNP in the last 8760 hours.  CBG: No results for input(s): GLUCAP in the last 168 hours.     Signed:  Fayrene Helper MD.  [facility] 09/03/2018, 8:11 AM

## 2018-09-04 DIAGNOSIS — I82511 Chronic embolism and thrombosis of right femoral vein: Secondary | ICD-10-CM | POA: Insufficient documentation

## 2018-09-11 MED ORDER — BISACODYL 10 MG RE SUPP
10.00 | RECTAL | Status: DC
Start: 2018-09-12 — End: 2018-09-11

## 2018-09-11 MED ORDER — POLYETHYLENE GLYCOL 3350 17 G PO PACK
17.00 | PACK | ORAL | Status: DC
Start: 2018-09-12 — End: 2018-09-11

## 2018-09-11 MED ORDER — OXYCODONE HCL 5 MG PO TABS
2.50 | ORAL_TABLET | ORAL | Status: DC
Start: ? — End: 2018-09-11

## 2018-09-11 MED ORDER — HYDROCORTISONE 1 % EX OINT
TOPICAL_OINTMENT | CUTANEOUS | Status: DC
Start: 2018-09-11 — End: 2018-09-11

## 2018-09-11 MED ORDER — HYDROXYCHLOROQUINE SULFATE 200 MG PO TABS
200.00 | ORAL_TABLET | ORAL | Status: DC
Start: 2018-09-12 — End: 2018-09-11

## 2018-09-11 MED ORDER — ATORVASTATIN CALCIUM 40 MG PO TABS
80.00 | ORAL_TABLET | ORAL | Status: DC
Start: 2018-09-12 — End: 2018-09-11

## 2018-09-11 MED ORDER — ENOXAPARIN SODIUM 40 MG/0.4ML ~~LOC~~ SOLN
1.00 | SUBCUTANEOUS | Status: DC
Start: 2018-09-11 — End: 2018-09-11

## 2018-09-11 MED ORDER — SENNOSIDES-DOCUSATE SODIUM 8.6-50 MG PO TABS
2.00 | ORAL_TABLET | ORAL | Status: DC
Start: 2018-09-11 — End: 2018-09-11

## 2018-09-11 MED ORDER — AMANTADINE HCL 100 MG PO CAPS
100.00 | ORAL_CAPSULE | ORAL | Status: DC
Start: 2018-09-11 — End: 2018-09-11

## 2019-02-20 ENCOUNTER — Emergency Department: Payer: Medicare Other

## 2019-02-20 ENCOUNTER — Other Ambulatory Visit: Payer: Self-pay

## 2019-02-20 ENCOUNTER — Emergency Department
Admission: EM | Admit: 2019-02-20 | Discharge: 2019-02-20 | Disposition: A | Discharge status: Other Acute Inpatient Hospital | Payer: Medicare Other | Attending: Emergency Medicine | Admitting: Emergency Medicine

## 2019-02-20 DIAGNOSIS — Z7901 Long term (current) use of anticoagulants: Secondary | ICD-10-CM | POA: Insufficient documentation

## 2019-02-20 DIAGNOSIS — G9340 Encephalopathy, unspecified: Secondary | ICD-10-CM | POA: Diagnosis not present

## 2019-02-20 DIAGNOSIS — Z87891 Personal history of nicotine dependence: Secondary | ICD-10-CM | POA: Diagnosis not present

## 2019-02-20 DIAGNOSIS — Z79899 Other long term (current) drug therapy: Secondary | ICD-10-CM | POA: Diagnosis not present

## 2019-02-20 DIAGNOSIS — R4182 Altered mental status, unspecified: Secondary | ICD-10-CM | POA: Diagnosis present

## 2019-02-20 DIAGNOSIS — R569 Unspecified convulsions: Secondary | ICD-10-CM | POA: Diagnosis not present

## 2019-02-20 DIAGNOSIS — I10 Essential (primary) hypertension: Secondary | ICD-10-CM | POA: Insufficient documentation

## 2019-02-20 DIAGNOSIS — I82409 Acute embolism and thrombosis of unspecified deep veins of unspecified lower extremity: Secondary | ICD-10-CM | POA: Diagnosis not present

## 2019-02-20 DIAGNOSIS — Z20828 Contact with and (suspected) exposure to other viral communicable diseases: Secondary | ICD-10-CM | POA: Insufficient documentation

## 2019-02-20 DIAGNOSIS — R1310 Dysphagia, unspecified: Secondary | ICD-10-CM | POA: Insufficient documentation

## 2019-02-20 LAB — URINALYSIS, COMPLETE (UACMP) WITH MICROSCOPIC
Bacteria, UA: NONE SEEN
Bilirubin Urine: NEGATIVE
Glucose, UA: NEGATIVE mg/dL
Ketones, ur: 5 mg/dL — AB
Nitrite: NEGATIVE
Protein, ur: 100 mg/dL — AB
RBC / HPF: >50 RBC/hpf — ABNORMAL HIGH (ref 0–5)
Specific Gravity, Urine: 1.023 (ref 1.005–1.030)
pH: 6 (ref 5.0–8.0)

## 2019-02-20 LAB — BLOOD GAS, ARTERIAL
Acid-base deficit: 1.3 mmol/L (ref 0.0–2.0)
Bicarbonate: 23.7 mmol/L (ref 20.0–28.0)
FIO2: 0.35
MECHVT: 450 mL
O2 Saturation: 95 %
PEEP: 5 cmH2O
Patient temperature: 37
RATE: 16 resp/min
pCO2 arterial: 40 mmHg (ref 32.0–48.0)
pH, Arterial: 7.38 (ref 7.350–7.450)
pO2, Arterial: 77 mmHg — ABNORMAL LOW (ref 83.0–108.0)

## 2019-02-20 LAB — CBC WITH DIFFERENTIAL/PLATELET
Abs Immature Granulocytes: 0.05 10*3/uL (ref 0.00–0.07)
Basophils Absolute: 0 10*3/uL (ref 0.0–0.1)
Basophils Relative: 0 %
Eosinophils Absolute: 0 10*3/uL (ref 0.0–0.5)
Eosinophils Relative: 0 %
HCT: 40.4 % (ref 36.0–46.0)
Hemoglobin: 13.5 g/dL (ref 12.0–15.0)
Immature Granulocytes: 0 %
Lymphocytes Relative: 4 %
Lymphs Abs: 0.6 10*3/uL — ABNORMAL LOW (ref 0.7–4.0)
MCH: 31.5 pg (ref 26.0–34.0)
MCHC: 33.4 g/dL (ref 30.0–36.0)
MCV: 94.2 fL (ref 80.0–100.0)
Monocytes Absolute: 0.5 10*3/uL (ref 0.1–1.0)
Monocytes Relative: 3 %
Neutro Abs: 13.1 10*3/uL — ABNORMAL HIGH (ref 1.7–7.7)
Neutrophils Relative %: 93 %
Platelets: 188 10*3/uL (ref 150–400)
RBC: 4.29 MIL/uL (ref 3.87–5.11)
RDW: 12.7 % (ref 11.5–15.5)
WBC: 14.2 10*3/uL — ABNORMAL HIGH (ref 4.0–10.5)
nRBC: 0 % (ref 0.0–0.2)

## 2019-02-20 LAB — BASIC METABOLIC PANEL
Anion gap: 12 (ref 5–15)
BUN: 15 mg/dL (ref 6–20)
CO2: 25 mmol/L (ref 22–32)
Calcium: 9.7 mg/dL (ref 8.9–10.3)
Chloride: 100 mmol/L (ref 98–111)
Creatinine, Ser: 0.82 mg/dL (ref 0.44–1.00)
GFR calc Af Amer: >60 mL/min (ref >60)
GFR calc non Af Amer: >60 mL/min (ref >60)
Glucose, Bld: 135 mg/dL — ABNORMAL HIGH (ref 70–99)
Potassium: 3.8 mmol/L (ref 3.5–5.1)
Sodium: 137 mmol/L (ref 135–145)

## 2019-02-20 LAB — URINE DRUG SCREEN, QUALITATIVE (ARMC ONLY)
Amphetamines, Ur Screen: NOT DETECTED
Barbiturates, Ur Screen: NOT DETECTED
Benzodiazepine, Ur Scrn: NOT DETECTED
Cannabinoid 50 Ng, Ur ~~LOC~~: POSITIVE — AB
Cocaine Metabolite,Ur ~~LOC~~: NOT DETECTED
MDMA (Ecstasy)Ur Screen: NOT DETECTED
Methadone Scn, Ur: NOT DETECTED
Opiate, Ur Screen: POSITIVE — AB
Phencyclidine (PCP) Ur S: NOT DETECTED
Tricyclic, Ur Screen: NOT DETECTED

## 2019-02-20 LAB — HEPATIC FUNCTION PANEL
ALT: 16 U/L (ref 0–44)
AST: 23 U/L (ref 15–41)
Albumin: 4.2 g/dL (ref 3.5–5.0)
Alkaline Phosphatase: 87 U/L (ref 38–126)
Bilirubin, Direct: <0.1 mg/dL (ref 0.0–0.2)
Total Bilirubin: 0.8 mg/dL (ref 0.3–1.2)
Total Protein: 9.1 g/dL — ABNORMAL HIGH (ref 6.5–8.1)

## 2019-02-20 LAB — ETHANOL: Alcohol, Ethyl (B): <10 mg/dL (ref <10)

## 2019-02-20 LAB — PROTIME-INR
INR: 1.3 — ABNORMAL HIGH (ref 0.8–1.2)
Prothrombin Time: 15.6 seconds — ABNORMAL HIGH (ref 11.4–15.2)

## 2019-02-20 LAB — TROPONIN I (HIGH SENSITIVITY)
Troponin I (High Sensitivity): 7 ng/L (ref <18)
Troponin I (High Sensitivity): 34 ng/L — ABNORMAL HIGH (ref <18)

## 2019-02-20 LAB — LACTIC ACID, PLASMA
Lactic Acid, Venous: 2.7 mmol/L (ref 0.5–1.9)
Lactic Acid, Venous: 3.9 mmol/L (ref 0.5–1.9)

## 2019-02-20 LAB — SARS CORONAVIRUS 2 BY RT PCR (HOSPITAL ORDER, PERFORMED IN ~~LOC~~ HOSPITAL LAB): SARS Coronavirus 2: NEGATIVE

## 2019-02-20 LAB — AMMONIA: Ammonia: 18 umol/L (ref 9–35)

## 2019-02-20 MED ORDER — DEXAMETHASONE SODIUM PHOSPHATE 10 MG/ML IJ SOLN
10.0000 mg | Freq: Once | INTRAMUSCULAR | Status: AC
  Administered 2019-02-20: 10 mg via INTRAVENOUS

## 2019-02-20 MED ORDER — LORAZEPAM 2 MG/ML IJ SOLN
INTRAMUSCULAR | Status: AC
  Administered 2019-02-20: 09:00:00
  Filled 2019-02-20: qty 1

## 2019-02-20 MED ORDER — PROPOFOL 1000 MG/100ML IV EMUL
5.0000 ug/kg/min | INTRAVENOUS | Status: DC
  Administered 2019-02-20: 5 ug/kg/min via INTRAVENOUS
  Filled 2019-02-20: qty 100

## 2019-02-20 MED ORDER — NICARDIPINE HCL IN NACL 20-0.86 MG/200ML-% IV SOLN
5.0000 mg/h | INTRAVENOUS | Status: DC
  Administered 2019-02-20: 5 mg/h via INTRAVENOUS
  Filled 2019-02-20 (×2): qty 200

## 2019-02-20 MED ORDER — VANCOMYCIN HCL IN DEXTROSE 1-5 GM/200ML-% IV SOLN: 1000.0000 mg | Freq: Once | INTRAVENOUS | Status: DC

## 2019-02-20 MED ORDER — SODIUM CHLORIDE 0.9 % IV SOLN
2.0000 g | Freq: Once | INTRAVENOUS | Status: AC
  Administered 2019-02-20: 2 g via INTRAVENOUS
  Filled 2019-02-20: qty 2

## 2019-02-20 MED ORDER — METRONIDAZOLE IN NACL 5-0.79 MG/ML-% IV SOLN
500.0000 mg | Freq: Once | INTRAVENOUS | Status: AC
  Administered 2019-02-20: 500 mg via INTRAVENOUS
  Filled 2019-02-20: qty 100

## 2019-02-20 MED ORDER — DEXAMETHASONE SODIUM PHOSPHATE 10 MG/ML IJ SOLN
INTRAMUSCULAR | Status: AC
  Filled 2019-02-20: qty 1

## 2019-02-20 MED ORDER — PROPOFOL 1000 MG/100ML IV EMUL
5.0000 ug/kg/min | INTRAVENOUS | Status: DC
  Administered 2019-02-20: 6.434 ug/kg/min via INTRAVENOUS

## 2019-02-20 MED ORDER — ACETAMINOPHEN 650 MG RE SUPP
650.0000 mg | Freq: Once | RECTAL | Status: AC
  Administered 2019-02-20: 650 mg via RECTAL
  Filled 2019-02-20: qty 1

## 2019-02-20 MED ORDER — IOHEXOL 350 MG/ML SOLN
75.0000 mL | Freq: Once | INTRAVENOUS | Status: AC | PRN
  Administered 2019-02-20: 75 mL via INTRAVENOUS

## 2019-02-20 MED ORDER — LACTATED RINGERS IV BOLUS
1000.0000 mL | Freq: Once | INTRAVENOUS | Status: AC
  Administered 2019-02-20: 1000 mL via INTRAVENOUS

## 2019-02-20 MED ORDER — LEVETIRACETAM IN NACL 1500 MG/100ML IV SOLN
1500.0000 mg | Freq: Once | INTRAVENOUS | Status: AC
  Administered 2019-02-20: 1500 mg via INTRAVENOUS
  Filled 2019-02-20: qty 100

## 2019-02-20 MED ORDER — ROCURONIUM BROMIDE 50 MG/5ML IV SOLN
70.0000 mg | Freq: Once | INTRAVENOUS | Status: AC
  Administered 2019-02-20: 70 mg via INTRAVENOUS
  Filled 2019-02-20: qty 7

## 2019-02-20 MED ORDER — ETOMIDATE 2 MG/ML IV SOLN
20.0000 mg | Freq: Once | INTRAVENOUS | Status: AC
  Administered 2019-02-20: 20 mg via INTRAVENOUS

## 2019-02-20 MED ORDER — PROPOFOL 1000 MG/100ML IV EMUL
INTRAVENOUS | Status: AC
  Filled 2019-02-20: qty 100

## 2019-02-20 NOTE — ED Notes (Signed)
Pt unable to sign transport consent due to intubation

## 2019-02-20 NOTE — ED Notes (Addendum)
Pt began to have seizure like activity with eyes rolling into head and then nystagmus with snoring respirations - Dr Charna Archer aware and back at bedside - pt with disconjugate gaze Pt son in family waiting room  Pt desat to 85% - placed on 2L O2 via n/c  No IV access at this time to give the Ativan that was verbally ordered - provider attempting u/s IV

## 2019-02-20 NOTE — ED Provider Notes (Addendum)
[facility] Emergency Department Provider Note   ____________________________________________   First MD Initiated Contact with Patient 02/20/19 [phone]     (approximate)  I have reviewed the triage vital signs and the nursing notes.   HISTORY  Chief Complaint Altered Mental Status    HPI Valerie Bell is a 53 y.o. female with past medical history of lupus, myelitis, hypertension, and DVT on Eliquis who presents to the ED for altered mental status.  Per son, patient seemed to be doing well yesterday and even had a doctor's appointment where she was at her baseline.  When son went to check on her this morning, he noticed that she was minimally responsive.  Son is not aware of any changes to her medication regimen, but does state that she was given medication yesterday for sedation for a bone marrow biopsy.  He states she does not take any pain medication.  She is also been feeling well recently with no fevers, chills, cough, chest pain, shortness of breath, or abdominal pain.  She reportedly had a similar presentation in March of this year, required transfer to [facility] where there was a question of whether patient had autoimmune mediated encephalopathy, but no definitive diagnosis was made.        Past Medical History:  Diagnosis Date  . Anxiety   . Collagen vascular disease ([place])   . DVT (deep venous thrombosis) ([place])   . Hypertension   . Lupus (systemic lupus erythematosus) ([place])   . Spinal cord lesion [facility])     Patient Active Problem List   Diagnosis Date Noted  . Pressure injury of skin 09/02/2018  . Acute encephalopathy 08/27/2018  . Encephalitis 08/27/2018  . Benign essential HTN 08/27/2018  . Leucocytosis 08/27/2018  . Lupus ([place]) 08/27/2018  . DVT (deep vein thrombosis) in pregnancy 08/27/2018  . Lethargy 08/24/2018  . Chest pain 03/30/2017    Past Surgical History:  Procedure Laterality Date  . ABDOMINAL HYSTERECTOMY    . BREAST  BIOPSY Right 17+ years ago   negative Core Bx    Prior to Admission medications   Medication Sig Start Date End Date Taking? Authorizing Provider  amantadine (SYMMETREL) 100 MG capsule Take 100 mg by mouth 2 (two) times daily. 09/13/18 02/01/20 Yes [provider]  atorvastatin (LIPITOR) 80 MG tablet Take 80 mg by mouth daily.   Yes [provider]  hydrocortisone 1 % ointment Apply 1 application topically 2 (two) times daily. 09/13/18  Yes [provider]  mirtazapine (REMERON) 7.5 MG tablet Take 7.5 mg by mouth at bedtime. 01/14/19 01/14/20 Yes [provider]  polyethylene glycol powder (GAVILAX) 17 GM/SCOOP powder Take 17 g by mouth daily. 09/13/18  Yes [provider]  rivaroxaban (XARELTO) 20 MG TABS tablet Take 20 mg by mouth daily with supper. 09/13/18 10/30/19 Yes [provider]  senna-docusate (SENOKOT-S) 8.6-50 MG tablet Take 2 tablets by mouth Nightly. 09/13/18  Yes [provider]  heparin 25000-0.45 UT/250ML-% infusion Inject 700 Units/hr into the vein continuous. 08/25/18   Dustin Flock, MD  hydroxychloroquine (PLAQUENIL) 200 MG tablet Take 200 mg by mouth daily.    [provider]    Allergies Ace inhibitors  Family History  Problem Relation Age of Onset  . Arthritis Mother   . Hypertension Mother   . Breast cancer Neg Hx     Social History Social History   Tobacco Use  . Smoking status: Former Smoker    Packs/day: 0.30  Years: 4.00    Pack years: 1.20    Types: Cigarettes    Quit date: 12/30/2003    Years since quitting: 15.1  . Smokeless tobacco: Never Used  Substance Use Topics  . Alcohol use: No    Alcohol/week: 0.0 standard drinks  . Drug use: Yes    Types: Marijuana    Comment: 2 days ago.     Review of Systems Unable to obtain secondary to altered mental status  ____________________________________________   PHYSICAL EXAM:  VITAL SIGNS: ED Triage Vitals  Enc Vitals Group      BP 02/20/19 0831 (!) 195/123     Pulse Rate 02/20/19 0831 (!) 104     Resp 02/20/19 0831 19     Temp 02/20/19 0831 98 F (36.7 C)     Temp Source 02/20/19 0831 Axillary     SpO2 02/20/19 0831 97 %     Weight 02/20/19 0833 120 lb (54.4 kg)     Height 02/20/19 0833 _0  (1.6 m)     Head Circumference --      Peak Flow --      Pain Score --      Pain Loc --      Pain Edu? --      Excl. in [place]? --     Constitutional: Minimally responsive. Eyes: Conjunctivae are normal.  Eyes disconjugate, pupils equal round and reactive to light bilaterally Head: Atraumatic. Nose: No congestion/rhinnorhea. Mouth/Throat: Mucous membranes are moist. Neck: Normal ROM Cardiovascular: Tachycardic, regular rhythm. Grossly normal heart sounds. Respiratory: Normal respiratory effort.  No retractions. Lungs CTAB. Gastrointestinal: Soft and nontender. No distention. Genitourinary: deferred Musculoskeletal: No lower extremity tenderness nor edema. Neurologic: Opens eyes to voice, only verbally responds with groan, localizes to pain in all 4 extremities. Skin:  Skin is warm, dry and intact. No rash noted.   ____________________________________________   LABS (all labs ordered are listed, but only abnormal results are displayed)  Labs Reviewed  BASIC METABOLIC PANEL - Abnormal; Notable for the following components:      Result Value   Glucose, Bld 135 (*)    All other components within normal limits  HEPATIC FUNCTION PANEL - Abnormal; Notable for the following components:   Total Protein 9.1 (*)    All other components within normal limits  CBC WITH DIFFERENTIAL/PLATELET - Abnormal; Notable for the following components:   WBC 14.2 (*)    Neutro Abs 13.1 (*)    Lymphs Abs 0.6 (*)    All other components within normal limits  URINALYSIS, COMPLETE (UACMP) WITH MICROSCOPIC - Abnormal; Notable for the following components:   Color, Urine YELLOW (*)    APPearance CLOUDY (*)    Hgb urine dipstick  SMALL (*)    Ketones, ur 5 (*)    Protein, ur 100 (*)    Leukocytes,Ua SMALL (*)    RBC / HPF >50 (*)    All other components within normal limits  URINE DRUG SCREEN, QUALITATIVE ([facility] ONLY) - Abnormal; Notable for the following components:   Opiate, Ur Screen POSITIVE (*)    Cannabinoid 50 Ng, Ur [place] POSITIVE (*)    All other components within normal limits  PROTIME-INR - Abnormal; Notable for the following components:   Prothrombin Time 15.6 (*)    INR 1.3 (*)    All other components within normal limits  LACTIC ACID, PLASMA - Abnormal; Notable for the following components:   Lactic Acid, Venous 3.9 (*)    All other  components within normal limits  LACTIC ACID, PLASMA - Abnormal; Notable for the following components:   Lactic Acid, Venous 2.7 (*)    All other components within normal limits  BLOOD GAS, ARTERIAL - Abnormal; Notable for the following components:   pO2, Arterial 77 (*)    All other components within normal limits  TROPONIN I (HIGH SENSITIVITY) - Abnormal; Notable for the following components:   Troponin I (High Sensitivity) 34 (*)    All other components within normal limits  SARS CORONAVIRUS 2 (HOSPITAL ORDER, [facility])  CULTURE, BLOOD (ROUTINE X 2)  CULTURE, BLOOD (ROUTINE X 2)  ETHANOL  AMMONIA  TROPONIN I (HIGH SENSITIVITY)   ____________________________________________  EKG  ED ECG REPORT I, Blake Divine, the attending physician, personally viewed and interpreted this ECG.   Date: 02/20/2019  EKG Time: 8:26  Rate: 96  Rhythm: sinus tachycardia  Axis: Normal  Intervals:none  ST&T Change: Nonspecific T wave changes, QT prolongation    PROCEDURES  Procedure(s) performed (including Critical Care):  Procedure Name: Intubation Date/Time: 02/20/2019 12:57 PM Performed by: Blake Divine, MD Pre-anesthesia Checklist: Patient identified, Patient being monitored, Emergency Drugs available, Timeout performed and Suction  available Oxygen Delivery Method: Ambu bag Preoxygenation: Pre-oxygenation with 100% oxygen Induction Type: Rapid sequence Ventilation: Mask ventilation without difficulty Laryngoscope Size: Mac and 4 Grade View: Grade I Tube size: 7.5 mm Number of attempts: 1 Airway Equipment and Method: Video-laryngoscopy Placement Confirmation: ETT inserted through vocal cords under direct vision,  CO2 detector and Breath sounds checked- equal and bilateral Secured at: 24 cm Tube secured with: ETT holder Difficulty Due To: Difficulty was unanticipated and Difficult Airway- due to anterior larynx     .Critical Care Performed by: Blake Divine, MD Authorized by: Blake Divine, MD   Critical care provider statement:    Critical care time (minutes):  65   Critical care time was exclusive of:  Separately billable procedures and treating other patients and teaching time   Critical care was necessary to treat or prevent imminent or life-threatening deterioration of the following conditions:  CNS failure or compromise   Critical care was time spent personally by me on the following activities:  Discussions with consultants, evaluation of patient's response to treatment, examination of patient, ordering and performing treatments and interventions, ordering and review of laboratory studies, ordering and review of radiographic studies, pulse oximetry, re-evaluation of patient's condition, obtaining history from patient or surrogate and review of old charts     ____________________________________________   INITIAL IMPRESSION / [place] / ED COURSE       53 year old female with history of lupus, DVT on Eliquis, and hypertension presents to the ED with decreased responsiveness noticed by her son this morning.  On initial evaluation patient has GCS of 9 and no focal neurologic deficits as she localizes to pain in all 4 extremities.  She does have abnormal eye movements, concerning for  possible subclinical seizures, however given patient was localizing to pain, this seems less likely at the time.  Patient has no history of seizures, but did have unusual presentation of encephalopathy back in March.  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  Head CT was obtained and showed lesions to bilateral occipital lobes, representing subacute infarct versus edema, patient subsequently given 10 mg Decadron to treat edema versus autoimmune cause.  Patient did  not appear to have an acute change, noted to have persistently deviated eyes to the left, no longer localizing to painful stimuli.  Shortly after this, patient had generalized tonic-clonic seizure lasting approximately 1 minute.  She was given 2 mg Ativan with cessation of seizure activity.  Mental status remained poor with GCS less than 8, and decision was made to intubate.  Some difficulty with intubation due to anterior position of larynx, however this was successful on first attempt.  Chest x-ray shows appropriate positioning of ET tube.  Patient was given Keppra load.  Case discussed with Dr. Silverio Decamp of neurology at [facility], who accepted patient for transfer.  Unfortunately, [facility] with no available ICU beds at this time, and transfer process initiated to [facility].  Patient noted to be febrile, likely secondary to seizures, will give rectal Tylenol.  Blood cultures drawn, no obvious source of infection at this time, UA questionable for infection and chest x-ray negative for acute process.  COVID testing negative.  Will cover with broad-spectrum antibiotics for now given elevated lactate and leukocytosis.  Case discussed with Dr. Hall Busing at [facility], who accepts patient for transfer.      ____________________________________________   FINAL CLINICAL IMPRESSION(S) / ED DIAGNOSES   Final diagnoses:  Altered mental status, unspecified altered mental status type  Seizure [facility])  Acute encephalopathy     ED Discharge Orders    None       Note:  This document was prepared using Dragon voice recognition software and may include unintentional dictation errors.   Blake Divine, MD 02/20/19 1420    Blake Divine, MD 02/20/19 [phone]

## 2019-02-20 NOTE — ED Notes (Signed)
Pt transported to CT ?

## 2019-02-20 NOTE — ED Notes (Addendum)
Informed son over the phone that pt is soon to be transported to [facility]

## 2019-02-20 NOTE — ED Notes (Signed)
Pt returned to room from CT

## 2019-02-20 NOTE — ED Notes (Signed)
Seizure pads placed

## 2019-02-20 NOTE — ED Notes (Signed)
Valerie Bell -son--will be the one visitor. His number is [phone]

## 2019-02-20 NOTE — ED Notes (Signed)
Pt son has been updated x3 at this point by Dr Charna Archer - he knows that he is allowed in room when he is ready

## 2019-02-20 NOTE — ED Notes (Addendum)
Pt had active seizure while provider in room - ativan 2mg  given stat - pt room preped for intubation and RT called

## 2019-02-20 NOTE — ED Notes (Signed)
Per Dr. Charna Archer, decreased Cardene to 2.5mg /hr

## 2019-02-20 NOTE — Consult Note (Signed)
CODE SEPSIS - PHARMACY COMMUNICATION  **Broad Spectrum Antibiotics should be administered within 1 hour of Sepsis diagnosis**  Time Code Sepsis Called/Page Received: 1241  Antibiotics Ordered: cefepime and vancomycin and flagyl  Time of 1st antibiotic administration: 1318  Additional action taken by pharmacy: Messaged RN  If necessary, Name of Provider/Nurse Contacted: Lowella Curb ,PharmD Clinical Pharmacist  02/20/2019  12:41 PM

## 2019-02-20 NOTE — ED Notes (Signed)
Lactated ringers changed to left EJ

## 2019-02-20 NOTE — ED Notes (Signed)
Report given to Darene Lamer, RN at [facility]. Pt's bed assignment is [id number]

## 2019-02-20 NOTE — ED Triage Notes (Signed)
Pt arrived via ems from home for report of altered mental status since this am - she had bone marrow biopsy yesterday r/o cancer - pt is not responsive to stimuli except sternal rub with a moan

## 2019-02-20 NOTE — ED Notes (Signed)
2nd set of blood cultures collected by lab, per Dr. Charna Archer can now start abx

## 2019-02-20 NOTE — ED Notes (Addendum)
Intubated at this time by Dr Charna Archer 7.5 tube 24 at the lip

## 2019-02-20 NOTE — ED Notes (Signed)
RT called for assistance with pt transfer to CT for CTA

## 2019-02-20 NOTE — ED Notes (Signed)
Unsuccessful attempt to collect 2nd set of blood cultures by this RN, lab contacted for assistance

## 2019-02-20 NOTE — Consult Note (Signed)
PHARMACY -  BRIEF ANTIBIOTIC NOTE   Pharmacy has received consult(s) for sepsis from an ED provider.  The patient's profile has been reviewed for ht/wt/allergies/indication/available labs.    One time order(s) placed for cefepime and vancomycin  Further antibiotics/pharmacy consults should be ordered by admitting physician if indicated.                       Thank you, Oswald Hillock 02/20/2019  12:42 PM

## 2019-02-20 NOTE — ED Notes (Signed)
Son updated

## 2019-02-20 NOTE — ED Notes (Signed)
Pt noted to have twitching of right hand - propofol increased

## 2019-02-20 NOTE — ED Notes (Signed)
Pt had two yellow small hoop earrings, one medium size yellow colored earring, and one yellow bracelet with clear stones Removed and placed in baggie and the in belongings bag with clothing

## 2019-02-20 NOTE — ED Notes (Signed)
Report given to Jessica RN

## 2019-02-20 NOTE — ED Notes (Signed)
Goal BP 180/100 per Dr Charna Archer

## 2019-02-20 NOTE — ED Notes (Signed)
Placement of ET and OG xrayed at this time

## 2019-02-20 NOTE — ED Notes (Signed)
Pt continues to have movement to right hand/arm - propofol adjusted accordingly  Dr Charna Archer verbalized that if CTA WNL then BP would be addressed

## 2019-02-20 NOTE — ED Notes (Signed)
Pt desat to 84% - O2 increased to 4L and sat increased to 90% - pt is unresponsive to all stimuli and has disconjugate gaze - provider at bedside

## 2019-02-20 NOTE — Progress Notes (Signed)
   02/20/19 1500  Clinical Encounter Type  Visited With Family  Visit Type Follow-up;ED  Spiritual Encounters  Spiritual Needs Emotional  Stress Factors  Family Stress Factors Health changes;Major life changes   Chaplain received a referral from officer to support the family of this patient as she was prepared to be transferred via helicopter to [facility]. This chaplain waited with the family as the patient was brought through the emergency traffic exit. The patient's son and roommate were understandably tearful and appreciative of the care provided.

## 2019-02-20 NOTE — ED Notes (Addendum)
RT notified to draw ABG Dr Charna Archer is aware of elevated blood pressures and elevated lactic acid

## 2019-02-20 NOTE — Sepsis Progress Note (Signed)
Notified bedside nurse of need to draw repeat lactic acid. 

## 2019-02-20 NOTE — ED Notes (Signed)
Pt's last set of vitals given to [employer] Design[employer], television/film set)

## 2019-02-20 NOTE — Progress Notes (Signed)
   02/20/19 1300  Clinical Encounter Type  Visited With Family  Visit Type Initial;ED  Referral From Nurse  Consult/Referral To Chaplain  Spiritual Encounters  Spiritual Needs Prayer;Emotional  Stress Factors  Family Stress Factors Major life changes  Chaplain received page for patient son. Chaplain arrived and son was at patient bedside. Chaplain made pastoral presence known and son shared how his mother was doing fine when he seen her Monday. However, he did share that she was having some problems with her legs since March. He also stated she went to have a bone marrow on yesterday and now today she is in the hospital.  He express she has never had seizers before. The son got really emotions and he walked out after Chaplain ask how was he feeling. Chaplain gave him space and he return 10 minutes later. Chaplain gave encouraging words of comfort.

## 2019-02-20 NOTE — Sepsis Progress Note (Signed)
Notified bedside nurse of need to administer antibiotics.  

## 2019-02-20 NOTE — ED Notes (Signed)
Blood cultures drawn by Colletta Maryland RN

## 2019-02-21 LAB — BLOOD CULTURE ID PANEL (REFLEXED)
Acinetobacter baumannii: NOT DETECTED
Acinetobacter baumannii: NOT DETECTED
Candida albicans: NOT DETECTED
Candida albicans: NOT DETECTED
Candida glabrata: NOT DETECTED
Candida glabrata: NOT DETECTED
Candida krusei: NOT DETECTED
Candida krusei: NOT DETECTED
Candida parapsilosis: NOT DETECTED
Candida parapsilosis: NOT DETECTED
Candida tropicalis: NOT DETECTED
Candida tropicalis: NOT DETECTED
Enterobacter cloacae complex: NOT DETECTED
Enterobacter cloacae complex: NOT DETECTED
Enterobacteriaceae species: NOT DETECTED
Enterobacteriaceae species: NOT DETECTED
Enterococcus species: NOT DETECTED
Enterococcus species: NOT DETECTED
Escherichia coli: NOT DETECTED
Escherichia coli: NOT DETECTED
Haemophilus influenzae: NOT DETECTED
Haemophilus influenzae: NOT DETECTED
Klebsiella oxytoca: NOT DETECTED
Klebsiella oxytoca: NOT DETECTED
Klebsiella pneumoniae: NOT DETECTED
Klebsiella pneumoniae: NOT DETECTED
Listeria monocytogenes: NOT DETECTED
Listeria monocytogenes: NOT DETECTED
Methicillin resistance: NOT DETECTED
Neisseria meningitidis: NOT DETECTED
Neisseria meningitidis: NOT DETECTED
Proteus species: NOT DETECTED
Proteus species: NOT DETECTED
Pseudomonas aeruginosa: NOT DETECTED
Pseudomonas aeruginosa: NOT DETECTED
Serratia marcescens: NOT DETECTED
Serratia marcescens: NOT DETECTED
Staphylococcus aureus (BCID): NOT DETECTED
Staphylococcus aureus (BCID): NOT DETECTED
Staphylococcus species: DETECTED — AB
Staphylococcus species: NOT DETECTED
Streptococcus agalactiae: NOT DETECTED
Streptococcus agalactiae: NOT DETECTED
Streptococcus pneumoniae: NOT DETECTED
Streptococcus pneumoniae: NOT DETECTED
Streptococcus pyogenes: NOT DETECTED
Streptococcus pyogenes: NOT DETECTED
Streptococcus species: NOT DETECTED
Streptococcus species: DETECTED — AB

## 2019-02-21 MED ORDER — GENERIC EXTERNAL MEDICATION
1500.00 | Status: DC
Start: 2019-02-21 — End: 2019-02-21

## 2019-02-21 MED ORDER — SODIUM CHLORIDE 0.9 % IV SOLN
INTRAVENOUS | Status: DC
Start: ? — End: 2019-02-21

## 2019-02-21 MED ORDER — CHLORHEXIDINE GLUCONATE 0.12 % MT SOLN
15.00 | OROMUCOSAL | Status: DC
Start: 2019-02-21 — End: 2019-02-21

## 2019-02-21 MED ORDER — RIVAROXABAN 20 MG PO TABS
20.00 | ORAL_TABLET | ORAL | Status: DC
Start: 2019-02-21 — End: 2019-02-21

## 2019-02-21 MED ORDER — FENTANYL CITRATE (PF) 50 MCG/ML IJ SOLN
25.00 | INTRAMUSCULAR | Status: DC
Start: ? — End: 2019-02-21

## 2019-02-21 MED ORDER — LABETALOL HCL 5 MG/ML IV SOLN
10.00 | INTRAVENOUS | Status: DC
Start: ? — End: 2019-02-21

## 2019-02-21 MED ORDER — AMANTADINE HCL 100 MG PO CAPS
100.00 | ORAL_CAPSULE | ORAL | Status: DC
Start: 2019-02-21 — End: 2019-02-21

## 2019-02-21 MED ORDER — ATORVASTATIN CALCIUM 10 MG PO TABS
20.00 | ORAL_TABLET | ORAL | Status: DC
Start: 2019-02-21 — End: 2019-02-21

## 2019-02-21 MED ORDER — GENERIC EXTERNAL MEDICATION
20.00 | Status: DC
Start: 2019-02-22 — End: 2019-02-21

## 2019-02-21 MED ORDER — FAMOTIDINE 20 MG PO TABS
20.00 | ORAL_TABLET | ORAL | Status: DC
Start: 2019-02-21 — End: 2019-02-21

## 2019-02-21 MED ORDER — INFLUENZA VAC SPLIT QUAD 0.5 ML IM SUSY
0.50 | PREFILLED_SYRINGE | INTRAMUSCULAR | Status: DC
Start: ? — End: 2019-02-21

## 2019-02-21 MED ORDER — CHLORHEXIDINE GLUCONATE 0.12 % MT SOLN
15.00 | OROMUCOSAL | Status: DC
Start: ? — End: 2019-02-21

## 2019-02-21 MED ORDER — GENERIC EXTERNAL MEDICATION
20.00 | Status: DC
Start: ? — End: 2019-02-21

## 2019-02-21 MED ORDER — ACETAMINOPHEN 325 MG PO TABS
650.00 | ORAL_TABLET | ORAL | Status: DC
Start: ? — End: 2019-02-21

## 2019-02-21 MED ORDER — GENERIC EXTERNAL MEDICATION
0.00 | Status: DC
Start: ? — End: 2019-02-21

## 2019-02-21 MED ORDER — MUPIROCIN 2 % EX OINT
TOPICAL_OINTMENT | CUTANEOUS | Status: DC
Start: 2019-02-21 — End: 2019-02-21

## 2019-02-21 MED ORDER — HYDRALAZINE HCL 20 MG/ML IJ SOLN
10.00 | INTRAMUSCULAR | Status: DC
Start: ? — End: 2019-02-21

## 2019-02-21 NOTE — ED Notes (Signed)
Called [facility]  and informed jess--charge rn that blood culture needs reviewed.  She will check in care everywhere.

## 2019-02-23 LAB — CULTURE, BLOOD (ROUTINE X 2)
Special Requests: ADEQUATE
Special Requests: ADEQUATE

## 2019-02-24 DIAGNOSIS — I6783 Posterior reversible encephalopathy syndrome: Secondary | ICD-10-CM | POA: Insufficient documentation

## 2019-05-26 ENCOUNTER — Emergency Department: Payer: Medicare Other

## 2019-05-26 ENCOUNTER — Other Ambulatory Visit: Payer: Self-pay

## 2019-05-26 ENCOUNTER — Emergency Department
Admission: EM | Admit: 2019-05-26 | Discharge: 2019-05-26 | Disposition: A | Discharge status: Home / Self Care | Payer: Medicare Other | Attending: Emergency Medicine | Admitting: Emergency Medicine

## 2019-05-26 ENCOUNTER — Encounter: Payer: Self-pay | Admitting: Medical Oncology

## 2019-05-26 DIAGNOSIS — Z7901 Long term (current) use of anticoagulants: Secondary | ICD-10-CM | POA: Diagnosis not present

## 2019-05-26 DIAGNOSIS — Z79899 Other long term (current) drug therapy: Secondary | ICD-10-CM | POA: Insufficient documentation

## 2019-05-26 DIAGNOSIS — I10 Essential (primary) hypertension: Secondary | ICD-10-CM | POA: Insufficient documentation

## 2019-05-26 DIAGNOSIS — F1210 Cannabis abuse, uncomplicated: Secondary | ICD-10-CM | POA: Diagnosis not present

## 2019-05-26 DIAGNOSIS — Z87891 Personal history of nicotine dependence: Secondary | ICD-10-CM | POA: Diagnosis not present

## 2019-05-26 DIAGNOSIS — K9423 Gastrostomy malfunction: Secondary | ICD-10-CM | POA: Diagnosis present

## 2019-05-26 MED ORDER — DIATRIZOATE MEGLUMINE & SODIUM 66-10 % PO SOLN
30.0000 mL | Freq: Once | ORAL | Status: AC
  Administered 2019-05-26: 30 mL

## 2019-05-26 NOTE — ED Provider Notes (Addendum)
[facility] Emergency Department Provider Note ____________________________________________  Time seen: 1606  I have reviewed the triage vital signs and the nursing notes.  HISTORY  Chief Complaint  g-tube issues  HPI Valerie Bell is a 53 y.o. female presents to the ED accompanied by her roommate, for evaluation of a dysfunctioning G-tube.  Patient had the PEG tube placed in September, secondary to  dysphagia, ikely due to encephalopathy.  She reports that as of about 2 days ago, the tube had been functioning normally with tube feedings to gravity.  Since that time however, she has had to use a syringe to force liquids down the G-tube.  She denies any leakage, pain, redness, fever, chills, or sweats.  She presents now for evaluation of a G-tube dysfunction. Her history is consistent with an autoimmune disorder, lupus, HTN, chronic DVT, and seizures.   Past Medical History:  Diagnosis Date  . Anxiety   . Collagen vascular disease ([place])   . DVT (deep venous thrombosis) ([place])   . Hypertension   . Lupus (systemic lupus erythematosus) ([place])   . Spinal cord lesion [facility])     Patient Active Problem List   Diagnosis Date Noted  . Pressure injury of skin 09/02/2018  . Acute encephalopathy 08/27/2018  . Encephalitis 08/27/2018  . Benign essential HTN 08/27/2018  . Leucocytosis 08/27/2018  . Lupus ([place]) 08/27/2018  . DVT (deep vein thrombosis) in pregnancy 08/27/2018  . Lethargy 08/24/2018  . Chest pain 03/30/2017    Past Surgical History:  Procedure Laterality Date  . ABDOMINAL HYSTERECTOMY    . BREAST BIOPSY Right 17+ years ago   negative Core Bx    Prior to Admission medications   Medication Sig Start Date End Date Taking? Authorizing Provider  amantadine (SYMMETREL) 100 MG capsule Take 100 mg by mouth 2 (two) times daily. 09/13/18 02/01/20  [provider]  atorvastatin (LIPITOR) 80 MG tablet Take 80 mg by mouth daily.    [provider]   heparin 25000-0.45 UT/250ML-% infusion Inject 700 Units/hr into the vein continuous. 08/25/18   Dustin Flock, MD  hydrocortisone 1 % ointment Apply 1 application topically 2 (two) times daily. 09/13/18   [provider]  hydroxychloroquine (PLAQUENIL) 200 MG tablet Take 200 mg by mouth daily.    [provider]  mirtazapine (REMERON) 7.5 MG tablet Take 7.5 mg by mouth at bedtime. 01/14/19 01/14/20  [provider]  polyethylene glycol powder (GAVILAX) 17 GM/SCOOP powder Take 17 g by mouth daily. 09/13/18   [provider]  rivaroxaban (XARELTO) 20 MG TABS tablet Take 20 mg by mouth daily with supper. 09/13/18 10/30/19  [provider]  senna-docusate (SENOKOT-S) 8.6-50 MG tablet Take 2 tablets by mouth Nightly. 09/13/18   [provider]    Allergies Ace inhibitors  Family History  Problem Relation Age of Onset  . Arthritis Mother   . Hypertension Mother   . Breast cancer Neg Hx     Social History Social History   Tobacco Use  . Smoking status: Former Smoker    Packs/day: 0.30    Years: 4.00    Pack years: 1.20    Types: Cigarettes    Quit date: 12/30/2003    Years since quitting: 15.4  . Smokeless tobacco: Never Used  Substance Use Topics  . Alcohol use: No    Alcohol/week: 0.0 standard drinks  . Drug use: Yes    Types: Marijuana    Comment: 2 days ago.  Review of Systems  Constitutional: Negative for fever. Cardiovascular: Negative for chest pain. Respiratory: Negative for shortness of breath. Gastrointestinal: Negative for abdominal pain, vomiting and diarrhea. G-tube dysfunction as above.  Genitourinary: Negative for dysuria. Musculoskeletal: Negative for back pain. Skin: Negative for rash. Neurological: Negative for headaches, focal weakness or numbness. ____________________________________________  PHYSICAL EXAM:  VITAL SIGNS: ED Triage Vitals [05/26/19 1514]  Enc Vitals Group     BP 109/88     Pulse  Rate 79     Resp 16     Temp 98.9 F (37.2 C)     Temp Source Oral     SpO2 100 %     Weight 120 lb (54.4 kg)     Height 5\' 1"  (1.549 m)     Head Circumference      Peak Flow      Pain Score 0     Pain Loc      Pain Edu?      Excl. in [place]?     Constitutional: Alert and oriented. Well appearing and in no distress. Head: Normocephalic and atraumatic. Eyes: Conjunctivae are normal. Normal extraocular movements Cardiovascular: Normal rate, regular rhythm. Normal distal pulses. Respiratory: Normal respiratory effort. No wheezes/rales/rhonchi. Gastrointestinal: Soft and nontender. No distention/ PEG tube in place to the LUQ. No surrounding erythema or leakage noted.  Musculoskeletal: Nontender with normal range of motion in all extremities.  Neurologic:  No gross focal neurologic deficits are appreciated. Skin:  Skin is warm, dry and intact. No rash noted. ____________________________________________   RADIOLOGY  DG ABD - Tube Placement IMPRESSION: Injection of contrast through the gastrostomy tube opacifies the stomach. ___________________________________________  PROCEDURES  PEG tube irrigation - nursing Attempt to replace PEG tube (24Fr) aborted due to patient with flange-style (single lumen) tube.  Procedures ____________________________________________  INITIAL IMPRESSION / ASSESSMENT AND PLAN / ED COURSE  Patient with ED evaluation of slow-draining PEG tube. The tube appears to function only with pressure. Since the PEG is functioning, however, we wil not attempt to remove and replace. Patient was advised to follow-up with [facility] primary care provider or GI for evaluation and replacement if needed.  ----------------------------------------- 8:04 PM / 8:25 PM on 05/26/2019 ----------------------------------------- Attempted to contact GI on-call x 2 Will refer patient for outpatient follow-up on PEG tube.     Patient and roommate verbalize understanding of exam, XR,  and deferred replacement of PEG tube. She will call tomorrow to coordinate follow-up care.   MITHRA BRANDEN was evaluated in Emergency Department on 05/26/2019 for the symptoms described in the history of present illness. She was evaluated in the context of the global COVID-19 pandemic, which necessitated consideration that the patient might be at risk for infection with the SARS-CoV-2 virus that causes COVID-19. Institutional protocols and algorithms that pertain to the evaluation of patients at risk for COVID-19 are in a state of rapid change based on information released by regulatory bodies including the CDC and federal and state organizations. These policies and algorithms were followed during the patient's care in the ED. ____________________________________________  FINAL CLINICAL IMPRESSION(S) / ED DIAGNOSES  Final diagnoses:  PEG tube malfunction [facility])      Carmie End, Dannielle Karvonen, PA-C 05/26/19 [address] Dannielle Karvonen, PA-C 05/26/19 2026    Melvenia Needles, PA-C 05/26/19 2027    Melvenia Needles, [place] 05/26/19 2034    Vanessa Russell, MD 05/27/19 1309

## 2019-05-26 NOTE — ED Notes (Signed)
This RN attempted to irrigate pt's peg tube with coke with PA at bedside to attempt to get PEG tube to flow to gravity, PEG tube flushes easily, pt denies any pain. Unable to get PEG tube to flow to gravity. This RN found in chart PEG tube placement is 62F jejunostomy to LUQ, confirmed with PA at this time, documented in chart.

## 2019-05-26 NOTE — ED Triage Notes (Signed)
Pt here with family who reports pt has been expericening some difficulty with her g-tube. Typically fluids usually flow by gravity but the past 2 days has been requiring a syringe to place food into. Denies other issues- not dislodged.

## 2019-05-26 NOTE — ED Notes (Signed)
NAD noted at time of D/C. Pt taken to lobby via wheelchair. Pt D/C into the care of her family member. Denies any comments/concerns regarding D/C instructions.

## 2019-05-26 NOTE — Discharge Instructions (Addendum)
We did not replace your PEG tube today. Since it is functioning, we suggest you follow-up with your provider or GI specialist for further management. Return to the ED if needed.

## 2019-05-26 NOTE — ED Notes (Signed)
This RN and PA at bedside, pt states problems with G-tube x 2 days, states normally is able to do tube feeds to gravity, however x 2 days has not been able to do tube feeds to gravity, has had to flush tubes feeds in. Pt denies other problems, denies dislodgement, denies abdominal pain at this time.

## 2019-06-11 ENCOUNTER — Emergency Department: Payer: Medicare Other

## 2019-06-11 ENCOUNTER — Emergency Department
Admission: EM | Admit: 2019-06-11 | Discharge: 2019-06-11 | Disposition: A | Discharge status: Home / Self Care | Payer: Medicare Other | Attending: Emergency Medicine | Admitting: Emergency Medicine

## 2019-06-11 ENCOUNTER — Other Ambulatory Visit: Payer: Self-pay

## 2019-06-11 ENCOUNTER — Encounter: Payer: Self-pay | Admitting: Emergency Medicine

## 2019-06-11 DIAGNOSIS — Y929 Unspecified place or not applicable: Secondary | ICD-10-CM | POA: Diagnosis not present

## 2019-06-11 DIAGNOSIS — Y999 Unspecified external cause status: Secondary | ICD-10-CM | POA: Diagnosis not present

## 2019-06-11 DIAGNOSIS — S93402A Sprain of unspecified ligament of left ankle, initial encounter: Secondary | ICD-10-CM | POA: Insufficient documentation

## 2019-06-11 DIAGNOSIS — X500XXA Overexertion from strenuous movement or load, initial encounter: Secondary | ICD-10-CM | POA: Diagnosis not present

## 2019-06-11 DIAGNOSIS — M3210 Systemic lupus erythematosus, organ or system involvement unspecified: Secondary | ICD-10-CM | POA: Diagnosis not present

## 2019-06-11 DIAGNOSIS — Y939 Activity, unspecified: Secondary | ICD-10-CM | POA: Diagnosis not present

## 2019-06-11 DIAGNOSIS — I10 Essential (primary) hypertension: Secondary | ICD-10-CM | POA: Insufficient documentation

## 2019-06-11 DIAGNOSIS — S99812A Other specified injuries of left ankle, initial encounter: Secondary | ICD-10-CM | POA: Diagnosis present

## 2019-06-11 DIAGNOSIS — Z87891 Personal history of nicotine dependence: Secondary | ICD-10-CM | POA: Insufficient documentation

## 2019-06-11 DIAGNOSIS — Z79899 Other long term (current) drug therapy: Secondary | ICD-10-CM | POA: Diagnosis not present

## 2019-06-11 NOTE — ED Notes (Signed)
Care giver states she noticed swelling and bruising to left foot and ankle 2 days ago   Per caregiver she does not walk

## 2019-06-11 NOTE — ED Triage Notes (Signed)
Presents with pain and swelling to left ankle.  Unsure of injury  Unable to bear full wt

## 2019-06-11 NOTE — Discharge Instructions (Addendum)
Follow-up with your primary care provider if any continued problems.  Ice and elevation to help with swelling.  Wear the Jones wrap for protection.  If any worsening of your symptoms or urgent concerns return to the emergency department over the holiday weekend.

## 2019-06-11 NOTE — ED Provider Notes (Signed)
[facility] Emergency Department Provider Note   ____________________________________________   First MD Initiated Contact with Patient 06/11/19 1118     (approximate)  I have reviewed the triage vital signs and the nursing notes.   HISTORY  Chief Complaint Ankle Pain Caregiver   HPI Valerie Bell is a 53 y.o. female presents to the ED with complaint of left ankle pain.  Patient denies any known injury to her ankle but states she is unable to bear weight on it today.  Caregiver came later and states that patient does not bear weight and caregiver helps her to transfer.  She is unaware of any actual injury but states it is possible that her ankle was twisted with transferring.  Patient has history of lupus, spinal cord lesion and collagen vascular disease.      Past Medical History:  Diagnosis Date  . Anxiety   . Collagen vascular disease ([place])   . DVT (deep venous thrombosis) ([place])   . Hypertension   . Lupus (systemic lupus erythematosus) ([place])   . Spinal cord lesion [facility])     Patient Active Problem List   Diagnosis Date Noted  . Pressure injury of skin 09/02/2018  . Acute encephalopathy 08/27/2018  . Encephalitis 08/27/2018  . Benign essential HTN 08/27/2018  . Leucocytosis 08/27/2018  . Lupus ([place]) 08/27/2018  . DVT (deep vein thrombosis) in pregnancy 08/27/2018  . Lethargy 08/24/2018  . Chest pain 03/30/2017    Past Surgical History:  Procedure Laterality Date  . ABDOMINAL HYSTERECTOMY    . BREAST BIOPSY Right 17+ years ago   negative Core Bx    Prior to Admission medications   Medication Sig Start Date End Date Taking? Authorizing Provider  amantadine (SYMMETREL) 100 MG capsule Take 100 mg by mouth 2 (two) times daily. 09/13/18 02/01/20  [provider]  atorvastatin (LIPITOR) 80 MG tablet Take 80 mg by mouth daily.    [provider]  heparin 25000-0.45 UT/250ML-% infusion Inject 700 Units/hr into the vein  continuous. 08/25/18   Dustin Flock, MD  hydrocortisone 1 % ointment Apply 1 application topically 2 (two) times daily. 09/13/18   [provider]  hydroxychloroquine (PLAQUENIL) 200 MG tablet Take 200 mg by mouth daily.    [provider]  mirtazapine (REMERON) 7.5 MG tablet Take 7.5 mg by mouth at bedtime. 01/14/19 01/14/20  [provider]  polyethylene glycol powder (GAVILAX) 17 GM/SCOOP powder Take 17 g by mouth daily. 09/13/18   [provider]  rivaroxaban (XARELTO) 20 MG TABS tablet Take 20 mg by mouth daily with supper. 09/13/18 10/30/19  [provider]  senna-docusate (SENOKOT-S) 8.6-50 MG tablet Take 2 tablets by mouth Nightly. 09/13/18   [provider]    Allergies Ace inhibitors  Family History  Problem Relation Age of Onset  . Arthritis Mother   . Hypertension Mother   . Breast cancer Neg Hx     Social History Social History   Tobacco Use  . Smoking status: Former Smoker    Packs/day: 0.30    Years: 4.00    Pack years: 1.20    Types: Cigarettes    Quit date: 12/30/2003    Years since quitting: 15.4  . Smokeless tobacco: Never Used  Substance Use Topics  . Alcohol use: No    Alcohol/week: 0.0 standard drinks  . Drug use: Yes    Types: Marijuana    Comment: 2 days ago.     Review of Systems Constitutional:  No fever/chills Cardiovascular: Denies chest pain. Respiratory: Denies shortness of breath. Musculoskeletal: Questionable injury to left foot and ankle. Skin: Negative for rash. Neurological: Negative for headaches, focal weakness or numbness. ___________________________________________   PHYSICAL EXAM:  VITAL SIGNS: ED Triage Vitals  Enc Vitals Group     BP      Pulse      Resp      Temp      Temp src      SpO2      Weight      Height      Head Circumference      Peak Flow      Pain Score      Pain Loc      Pain Edu?      Excl. in [place]?    Constitutional: Alert and oriented. Well  appearing and in no acute distress. Eyes: Conjunctivae are normal.  Head: Atraumatic. Neck: No stridor.   Cardiovascular: Normal rate, regular rhythm. Grossly normal heart sounds.  Good peripheral circulation. Respiratory: Normal respiratory effort.  No retractions. Lungs CTAB. Musculoskeletal: On examination of the left ankle and foot there is soft tissue edema present without ecchymosis or abrasions noted.  Skin is warm and dry.  Pulses present.  Motor sensory function is hard to determine as patient is a very poor historian.  Capillary refill is less than 3 seconds. Neurologic:  Normal speech and language. No gross focal neurologic deficits are appreciated. No gait instability. Skin:  Skin is warm, dry and intact.  Psychiatric: Mood and affect are normal. Speech and behavior are normal.  ____________________________________________   LABS (all labs ordered are listed, but only abnormal results are displayed)  Labs Reviewed - No data to display  RADIOLOGY   Official radiology report(s): DG Ankle Complete Left  Result Date: 06/11/2019 CLINICAL DATA:  Pain and swelling EXAM: LEFT ANKLE COMPLETE - 3+ VIEW COMPARISON:  None. FINDINGS: There is soft tissue swelling at the ankle, medial greater than lateral. There is no acute fracture. Joint space is preserved. No intrinsic osseous lesion. IMPRESSION: Soft tissue swelling.  No acute osseous abnormality Electronically Signed   By: Macy Mis M.D.   On: 06/11/2019 11:50   DG Foot Complete Left  Result Date: 06/11/2019 CLINICAL DATA:  Swelling.  No known injury. EXAM: LEFT FOOT - COMPLETE 3+ VIEW COMPARISON:  No prior. FINDINGS: No acute bony or joint abnormality. No evidence of fracture dislocation. Soft tissue swelling. No radiopaque foreign body. IMPRESSION: 1.  Soft tissue swelling.  No radiopaque foreign body. 2.  No acute bony abnormality. Electronically Signed   By: Marcello Moores  Register   On: 06/11/2019 12:53     ____________________________________________   PROCEDURES  Procedure(s) performed (including Critical Care):  Procedures   ____________________________________________   INITIAL IMPRESSION / ASSESSMENT AND PLAN / ED COURSE  As part of my medical decision making, I reviewed the following data within the electronic MEDICAL RECORD NUMBER Notes from prior ED visits and [place] Controlled Substance Database  53 year old female presents to the ED by caregiver with complaint of left ankle and foot edema.  Patient does not bear weight and caregiver turns her sideways to transfer her when needed.  Is unclear if possibly her foot twisted during a transfer.  There is been no history of patient falling.  Physical exam does show soft tissue edema on the dorsal aspect of the left foot and lateral malleolus.  No gross deformity is noted.  Capillary refills less than 3 seconds  and skin is warm and dry.  No fractures were noted on x-ray.  Patient and caregiver were made aware.  A Jones wrap was applied to the left foot and ankle.  Patient will follow up with her PCP if any continued problems.  Ice and elevation was encouraged. ____________________________________________   FINAL CLINICAL IMPRESSION(S) / ED DIAGNOSES  Final diagnoses:  Sprain of left ankle, unspecified ligament, initial encounter     ED Discharge Orders    None       Note:  This document was prepared using Dragon voice recognition software and may include unintentional dictation errors.    Johnn Hai, PA-C 06/11/19 1319    Lavonia Drafts, MD 06/11/19 1323

## 2019-06-20 DIAGNOSIS — Z931 Gastrostomy status: Secondary | ICD-10-CM | POA: Insufficient documentation

## 2019-06-20 DIAGNOSIS — Z993 Dependence on wheelchair: Secondary | ICD-10-CM | POA: Insufficient documentation

## 2019-06-20 DIAGNOSIS — I82402 Acute embolism and thrombosis of unspecified deep veins of left lower extremity: Secondary | ICD-10-CM | POA: Insufficient documentation

## 2019-06-20 DIAGNOSIS — L985 Mucinosis of the skin: Secondary | ICD-10-CM | POA: Insufficient documentation

## 2019-06-20 DIAGNOSIS — K9423 Gastrostomy malfunction: Secondary | ICD-10-CM | POA: Insufficient documentation

## 2019-07-08 DIAGNOSIS — E785 Hyperlipidemia, unspecified: Secondary | ICD-10-CM | POA: Insufficient documentation

## 2019-09-12 ENCOUNTER — Emergency Department
Admission: EM | Admit: 2019-09-12 | Discharge: 2019-09-12 | Disposition: A | Discharge status: Home / Self Care | Payer: Medicare Other | Attending: Emergency Medicine | Admitting: Emergency Medicine

## 2019-09-12 ENCOUNTER — Emergency Department: Payer: Medicare Other

## 2019-09-12 ENCOUNTER — Encounter: Payer: Self-pay | Admitting: Emergency Medicine

## 2019-09-12 ENCOUNTER — Other Ambulatory Visit: Payer: Self-pay

## 2019-09-12 DIAGNOSIS — M3210 Systemic lupus erythematosus, organ or system involvement unspecified: Secondary | ICD-10-CM | POA: Insufficient documentation

## 2019-09-12 DIAGNOSIS — R2242 Localized swelling, mass and lump, left lower limb: Secondary | ICD-10-CM | POA: Insufficient documentation

## 2019-09-12 DIAGNOSIS — M79605 Pain in left leg: Secondary | ICD-10-CM | POA: Diagnosis present

## 2019-09-12 DIAGNOSIS — I82402 Acute embolism and thrombosis of unspecified deep veins of left lower extremity: Secondary | ICD-10-CM | POA: Diagnosis not present

## 2019-09-12 DIAGNOSIS — I82511 Chronic embolism and thrombosis of right femoral vein: Secondary | ICD-10-CM | POA: Diagnosis not present

## 2019-09-12 DIAGNOSIS — I82503 Chronic embolism and thrombosis of unspecified deep veins of lower extremity, bilateral: Secondary | ICD-10-CM

## 2019-09-12 DIAGNOSIS — I10 Essential (primary) hypertension: Secondary | ICD-10-CM | POA: Insufficient documentation

## 2019-09-12 MED ORDER — OXYCODONE-ACETAMINOPHEN 5-325 MG PO TABS
2.0000 | ORAL_TABLET | Freq: Once | ORAL | Status: AC
  Administered 2019-09-12: 2
  Filled 2019-09-12: qty 2

## 2019-09-12 MED ORDER — OXYCODONE-ACETAMINOPHEN 5-325 MG PO TABS: 1.0000 | ORAL_TABLET | Freq: Three times a day (TID) | ORAL | 0 refills | Status: DC | PRN

## 2019-09-12 MED ORDER — RIVAROXABAN 20 MG PO TABS: 20.0000 mg | ORAL_TABLET | Freq: Every day | ORAL | 2 refills | Status: DC

## 2019-09-12 NOTE — ED Provider Notes (Addendum)
[facility] Emergency Department Provider Note       Time seen: ----------------------------------------- 4:51 PM on 09/12/2019 -----------------------------------------   I have reviewed the triage vital signs and the nursing notes.  HISTORY   Chief Complaint Leg Pain    HPI Valerie Bell is a 54 y.o. female with a history of anxiety, collagen vascular disease, DVT, hypertension, lupus, spinal cord lesion who presents to the ED for left leg pain and swelling.  Patient was sent by her primary care doctor for further evaluation.  Past Medical History:  Diagnosis Date  . Anxiety   . Collagen vascular disease ([place])   . DVT (deep venous thrombosis) ([place])   . Hypertension   . Lupus (systemic lupus erythematosus) ([place])   . Spinal cord lesion [facility])     Patient Active Problem List   Diagnosis Date Noted  . Pressure injury of skin 09/02/2018  . Acute encephalopathy 08/27/2018  . Encephalitis 08/27/2018  . Benign essential HTN 08/27/2018  . Leucocytosis 08/27/2018  . Lupus ([place]) 08/27/2018  . DVT (deep vein thrombosis) in pregnancy 08/27/2018  . Lethargy 08/24/2018  . Chest pain 03/30/2017    Past Surgical History:  Procedure Laterality Date  . ABDOMINAL HYSTERECTOMY    . BREAST BIOPSY Right 17+ years ago   negative Core Bx    Allergies Ace inhibitors  Social History Social History   Tobacco Use  . Smoking status: Former Smoker    Packs/day: 0.30    Years: 4.00    Pack years: 1.20    Types: Cigarettes    Quit date: 12/30/2003    Years since quitting: 15.7  . Smokeless tobacco: Never Used  Substance Use Topics  . Alcohol use: No    Alcohol/week: 0.0 standard drinks  . Drug use: Yes    Types: Marijuana    Comment: 2 days ago.     Review of Systems Constitutional: Negative for fever. Cardiovascular: Negative for chest pain. Respiratory: Negative for shortness of breath. Gastrointestinal: Negative for abdominal pain, vomiting and  diarrhea. Musculoskeletal: Positive for leg pain Skin: Negative for rash. Neurological: Negative for headaches, focal weakness or numbness.  All systems negative/normal/unremarkable except as stated in the HPI  ____________________________________________   PHYSICAL EXAM:  VITAL SIGNS: ED Triage Vitals  Enc Vitals Group     BP 09/12/19 1402 (!) 140/93     Pulse Rate 09/12/19 1402 84     Resp 09/12/19 1402 14     Temp 09/12/19 1402 97.6 F (36.4 C)     Temp Source 09/12/19 1402 Oral     SpO2 09/12/19 1402 98 %     Weight 09/12/19 1400 121 lb 4.1 oz (55 kg)     Height 09/12/19 1400 5\' 3"  (1.6 m)     Head Circumference --      Peak Flow --      Pain Score --      Pain Loc --      Pain Edu? --      Excl. in [place]? --    Constitutional: Alert and oriented. Well appearing and in no distress. Eyes: Conjunctivae are normal. Normal extraocular movements. Cardiovascular: Normal rate, regular rhythm. No murmurs, rubs, or gallops. Respiratory: Normal respiratory effort without tachypnea nor retractions. Breath sounds are clear and equal bilaterally. No wheezes/rales/rhonchi. Gastrointestinal: Soft and nontender. Normal bowel sounds Musculoskeletal: Nonfocal tenderness, there is some swelling around the left ankle.  No other specific tenderness on examination.  Good dorsalis pedis pulse in  the left foot Neurologic:  Normal speech and language. No gross focal neurologic deficits are appreciated.  Skin:  Skin is warm, dry and intact. No rash noted. Psychiatric: Mood and affect are normal. Speech and behavior are normal.   ____________________________________________  ED COURSE:  As part of my medical decision making, I reviewed the following data within the [place] History obtained from family if available, nursing notes, old chart and ekg, as well as notes from prior ED visits. Patient presented for left leg pain, we will assess with imaging as indicated at this  time.   Procedures  [name] was evaluated in Emergency Department on 09/12/2019 for the symptoms described in the history of present illness. She was evaluated in the context of the global COVID-19 pandemic, which necessitated consideration that the patient might be at risk for infection with the SARS-CoV-2 virus that causes COVID-19. Institutional protocols and algorithms that pertain to the evaluation of patients at risk for COVID-19 are in a state of rapid change based on information released by regulatory bodies including the CDC and federal and state organizations. These policies and algorithms were followed during the patient's care in the ED.  ____________________________________________   RADIOLOGY Images were viewed by me US venous IMPRESSION: 1. Age-indeterminate nonocclusive thrombus throughout the entirety of the left lower extremity venous system. 2. Chronic nonocclusive thrombus within the visualized right common femoral vein.   ____________________________________________   DIFFERENTIAL DIAGNOSIS   DVT, chronic pain, lupus, fracture unlikely  FINAL ASSESSMENT AND PLAN  Chronic DVT   Plan: The patient had presented for left leg pain which does not appear to be acute.  Patient's imaging reveals age-indeterminate thrombus on the left and chronic thrombus on the right.  Patient does not appear to have acute pain likely indicating that this is chronic DVT.  She is supposed to be on Xarelto and if not will represcribe this medication.  I will refer her to vascular surgery for outpatient follow-up.   Laurence Aly, MD    Note: This note was generated in part or whole with voice recognition software. Voice recognition is usually quite accurate but there are transcription errors that can and very often do occur. I apologize for any typographical errors that were not detected and corrected.     Earleen Newport, MD 09/12/19 1701    Earleen Newport,  MD 09/12/19 [phone]

## 2019-09-12 NOTE — ED Triage Notes (Signed)
Left leg pain and swelling.  Sent to ED by PCP.  Patient is poor historian.  Very difficult to get information from patient regarding current ED visit.  AAOx3.  Skin warm and dry. NAD

## 2019-10-27 ENCOUNTER — Encounter: Payer: Self-pay | Admitting: Emergency Medicine

## 2019-10-27 ENCOUNTER — Emergency Department
Admission: EM | Admit: 2019-10-27 | Discharge: 2019-10-27 | Disposition: A | Discharge status: Home / Self Care | Payer: Medicare Other | Attending: Emergency Medicine | Admitting: Emergency Medicine

## 2019-10-27 ENCOUNTER — Other Ambulatory Visit: Payer: Self-pay

## 2019-10-27 DIAGNOSIS — Z79899 Other long term (current) drug therapy: Secondary | ICD-10-CM | POA: Insufficient documentation

## 2019-10-27 DIAGNOSIS — M359 Systemic involvement of connective tissue, unspecified: Secondary | ICD-10-CM | POA: Insufficient documentation

## 2019-10-27 DIAGNOSIS — Z86718 Personal history of other venous thrombosis and embolism: Secondary | ICD-10-CM | POA: Insufficient documentation

## 2019-10-27 DIAGNOSIS — Z87891 Personal history of nicotine dependence: Secondary | ICD-10-CM | POA: Diagnosis not present

## 2019-10-27 DIAGNOSIS — M3210 Systemic lupus erythematosus, organ or system involvement unspecified: Secondary | ICD-10-CM | POA: Diagnosis not present

## 2019-10-27 DIAGNOSIS — R6889 Other general symptoms and signs: Secondary | ICD-10-CM

## 2019-10-27 DIAGNOSIS — K9423 Gastrostomy malfunction: Secondary | ICD-10-CM | POA: Insufficient documentation

## 2019-10-27 HISTORY — DX: Cerebral infarction, unspecified: I63.9

## 2019-10-27 NOTE — ED Triage Notes (Signed)
See triage note.  Patient is not able to answer whether her tube was not working or why it needs to be removed.  She is alert and sitting in chair.

## 2019-10-27 NOTE — ED Triage Notes (Signed)
Pt in via EMS from home. EMS reports per pt family, her G-tube has been coming out over the last few days and the family wants to have it removed.   175/105, HR 82, 100%RA

## 2019-10-27 NOTE — ED Provider Notes (Signed)
[facility] Emergency Department Provider Note   ____________________________________________    I have reviewed the triage vital signs and the nursing notes.   HISTORY  Chief Complaint Gastric tube problem  Patient unable to provide history, history from person she lives with, Valerie Bell HPI Valerie Bell is a 54 y.o. female with history of collagen vascular disease, lupus, spinal cord lesion, stroke who has a gastric tube.  Contacted Valerie Bell who notes that they have had difficulty flushing G-tube and are asking for it to be removed as she does not seem to need it anyway.  Review of medical records demonstrates prior ED visits for similar scenario.  Unable to determine where G-tube was placed  Past Medical History:  Diagnosis Date  . Anxiety   . Collagen vascular disease ([place])   . DVT (deep venous thrombosis) ([place])   . Hypertension   . Lupus (systemic lupus erythematosus) ([place])   . Spinal cord lesion ([place])   . Stroke [facility])     Patient Active Problem List   Diagnosis Date Noted  . Pressure injury of skin 09/02/2018  . Acute encephalopathy 08/27/2018  . Encephalitis 08/27/2018  . Benign essential HTN 08/27/2018  . Leucocytosis 08/27/2018  . Lupus ([place]) 08/27/2018  . DVT (deep vein thrombosis) in pregnancy 08/27/2018  . Lethargy 08/24/2018  . Chest pain 03/30/2017    Past Surgical History:  Procedure Laterality Date  . ABDOMINAL HYSTERECTOMY    . BREAST BIOPSY Right 17+ years ago   negative Core Bx    Prior to Admission medications   Medication Sig Start Date End Date Taking? Authorizing Provider  amantadine (SYMMETREL) 100 MG capsule Take 100 mg by mouth 2 (two) times daily. 09/13/18 02/01/20  [provider]  atorvastatin (LIPITOR) 80 MG tablet Take 80 mg by mouth daily.    [provider]  heparin 25000-0.45 UT/250ML-% infusion Inject 700 Units/hr into the vein continuous. 08/25/18   Dustin Flock, MD  hydrocortisone  1 % ointment Apply 1 application topically 2 (two) times daily. 09/13/18   [provider]  hydroxychloroquine (PLAQUENIL) 200 MG tablet Take 200 mg by mouth daily.    [provider]  mirtazapine (REMERON) 7.5 MG tablet Take 7.5 mg by mouth at bedtime. 01/14/19 01/14/20  [provider]  oxyCODONE-acetaminophen (PERCOCET) 5-325 MG tablet Take 1 tablet by mouth every 8 (eight) hours as needed. 09/12/19   Earleen Newport, MD  polyethylene glycol powder (GAVILAX) 17 GM/SCOOP powder Take 17 g by mouth daily. 09/13/18   [provider]  rivaroxaban (XARELTO) 20 MG TABS tablet Take 1 tablet (20 mg total) by mouth daily with supper. 09/12/19 10/28/20  Earleen Newport, MD  senna-docusate (SENOKOT-S) 8.6-50 MG tablet Take 2 tablets by mouth Nightly. 09/13/18   [provider]     Allergies Ace inhibitors  Family History  Problem Relation Age of Onset  . Arthritis Mother   . Hypertension Mother   . Breast cancer Neg Hx     Social History Social History   Tobacco Use  . Smoking status: Former Smoker    Packs/day: 0.30    Years: 4.00    Pack years: 1.20    Types: Cigarettes    Quit date: 12/30/2003    Years since quitting: 15.8  . Smokeless tobacco: Never Used  Substance Use Topics  . Alcohol use: No    Alcohol/week: 0.0 standard drinks  . Drug use: Yes    Types: Marijuana  Comment: 2 days ago.     Review of Systems severely limited as patient unable to give a history  Constitutional: No reports of fever  Gastrointestinal: No reports of vomiting Skin: No rash   ____________________________________________   PHYSICAL EXAM:  VITAL SIGNS: ED Triage Vitals  Enc Vitals Group     BP 10/27/19 0901 (!) 175/107     Pulse Rate 10/27/19 0901 80     Resp 10/27/19 0901 16     Temp 10/27/19 0901 98.6 F (37 C)     Temp Source 10/27/19 0901 Oral     SpO2 10/27/19 0901 100 %     Weight 10/27/19 0902 55 kg (121 lb 4.1 oz)     Height  --      Head Circumference --      Peak Flow --      Pain Score 10/27/19 1219 0     Pain Loc --      Pain Edu? --      Excl. in [place]? --     Constitutional: Alert   Mouth/Throat: Mucous membranes are moist.    Cardiovascular: Normal rate, regular rhythm.   Good peripheral circulation. Respiratory: Normal respiratory effort.  No retractions. Gastrointestinal: Soft and nontender. No distention.  G-tube in place, no surrounding erythema or skin breakdown, no surrounding tenderness or distention.  Gastric juices noted in G tube.  Flushes quite easily  Musculoskeletal:   Warm and well perfused  Skin:  Skin is warm, dry and intact. No rash noted.   ____________________________________________   LABS (all labs ordered are listed, but only abnormal results are displayed)  Labs Reviewed - No data to display ____________________________________________  EKG  None ____________________________________________  RADIOLOGY  None ____________________________________________   PROCEDURES  Procedure(s) performed: No  Procedures   Critical Care performed: No ____________________________________________   INITIAL IMPRESSION / ASSESSMENT AND PLAN / ED COURSE  Pertinent labs & imaging results that were available during my care of the patient were reviewed by me and considered in my medical decision making (see chart for details).  Patient's G-tube appears to be working as expected, flushes easily.  Discussed this with Valerie Bell who agrees with leaving G-tube in place, I also discussed with her that it would be a poor decision to remove it in case she needs in the future and that an emergency department would likely not be the place to have it removed.  Patient is appropriate for discharge    ____________________________________________   FINAL CLINICAL IMPRESSION(S) / ED DIAGNOSES  Final diagnoses:  Complaint associated with gastric tube [facility])        Note:  This  document was prepared using Dragon voice recognition software and may include unintentional dictation errors.   Lavonia Drafts, MD 10/27/19 1229

## 2019-10-27 NOTE — ED Notes (Signed)
Pt assisted into blue paper pants as she arrived with no pants on, and assisted to have her clothes placed back on. Pt tolerated well. Pt remains in wheelchair awaiting ride at this time.

## 2019-10-27 NOTE — ED Notes (Signed)
NAD noted at time of D/C. Pt taken to lobby via wheelchair by this RN. D/C into the care of [name], caregiver. EDP Kinner aware of patient's BP, states okay for D/C.

## 2019-10-27 NOTE — ED Triage Notes (Signed)
Called Valerie Bell and son--she lives with [employer].  They said the tube has not been working, and the patient eats anyway.  Say it was only for medication, but they have been giving her meds by mouth in ice cream.  They would like the tube removed.  They said the patient does not have home health at this time, and her dr is in [place], but they do not know which doctor.  They have not informed the patient's pcp about the tube not working.

## 2019-10-27 NOTE — ED Notes (Signed)
Pt denies any pain at this time. Pt's G tube flushed easily with gravity by this RN with water. EDP in agreement at this time. EDP spoke with patient's caregiver, states will come pick patient up.

## 2019-12-04 ENCOUNTER — Emergency Department
Admission: EM | Admit: 2019-12-04 | Discharge: 2019-12-04 | Disposition: A | Discharge status: Home / Self Care | Payer: Medicare Other | Attending: Emergency Medicine | Admitting: Emergency Medicine

## 2019-12-04 ENCOUNTER — Other Ambulatory Visit: Payer: Self-pay

## 2019-12-04 DIAGNOSIS — Z931 Gastrostomy status: Secondary | ICD-10-CM | POA: Diagnosis not present

## 2019-12-04 DIAGNOSIS — Z7901 Long term (current) use of anticoagulants: Secondary | ICD-10-CM | POA: Diagnosis not present

## 2019-12-04 DIAGNOSIS — Z79899 Other long term (current) drug therapy: Secondary | ICD-10-CM | POA: Diagnosis not present

## 2019-12-04 DIAGNOSIS — Z87891 Personal history of nicotine dependence: Secondary | ICD-10-CM | POA: Diagnosis not present

## 2019-12-04 DIAGNOSIS — T85528A Displacement of other gastrointestinal prosthetic devices, implants and grafts, initial encounter: Secondary | ICD-10-CM | POA: Diagnosis not present

## 2019-12-04 DIAGNOSIS — I10 Essential (primary) hypertension: Secondary | ICD-10-CM | POA: Diagnosis not present

## 2019-12-04 DIAGNOSIS — Z8673 Personal history of transient ischemic attack (TIA), and cerebral infarction without residual deficits: Secondary | ICD-10-CM | POA: Diagnosis not present

## 2019-12-04 DIAGNOSIS — F1210 Cannabis abuse, uncomplicated: Secondary | ICD-10-CM | POA: Insufficient documentation

## 2019-12-04 NOTE — ED Notes (Signed)
See triage note. Pt states her g-tube fell out earlier today. No hx of this happening before. Denies any pain.

## 2019-12-04 NOTE — ED Provider Notes (Signed)
[facility] Emergency Department Provider Note  ____________________________________________  Time seen: Approximately 7:37 PM  I have reviewed the triage vital signs and the nursing notes.   HISTORY  Chief Complaint g-tube replacement    HPI Valerie Bell is a 54 y.o. female who presented to the emergency department complaining of displaced G-tube.  Patient states that her G-tube was displaced directly prior to arrival.  She brought in with her.  Patient denies any pain, bleeding to the site.  No other complaints at this time.  She is here for replacement only.  Patient initially stated that she did not use her G-tube anymore, was placed when she had a stroke.  However as we progressed, patient stated that she used it for both nutrition and medications every day.  This was further clarified by patient's emergency contact and was found that the patient did not in fact use the G-tube anymore.         Past Medical History:  Diagnosis Date  . Anxiety   . Collagen vascular disease ([place])   . DVT (deep venous thrombosis) ([place])   . Hypertension   . Lupus (systemic lupus erythematosus) ([place])   . Spinal cord lesion ([place])   . Stroke [facility])     Patient Active Problem List   Diagnosis Date Noted  . Pressure injury of skin 09/02/2018  . Acute encephalopathy 08/27/2018  . Encephalitis 08/27/2018  . Benign essential HTN 08/27/2018  . Leucocytosis 08/27/2018  . Lupus ([place]) 08/27/2018  . DVT (deep vein thrombosis) in pregnancy 08/27/2018  . Lethargy 08/24/2018  . Chest pain 03/30/2017    Past Surgical History:  Procedure Laterality Date  . ABDOMINAL HYSTERECTOMY    . BREAST BIOPSY Right 17+ years ago   negative Core Bx    Prior to Admission medications   Medication Sig Start Date End Date Taking? Authorizing Provider  amantadine (SYMMETREL) 100 MG capsule Take 100 mg by mouth 2 (two) times daily. 09/13/18 02/01/20  [provider]  atorvastatin  (LIPITOR) 80 MG tablet Take 80 mg by mouth daily.    [provider]  heparin 25000-0.45 UT/250ML-% infusion Inject 700 Units/hr into the vein continuous. 08/25/18   Dustin Flock, MD  hydrocortisone 1 % ointment Apply 1 application topically 2 (two) times daily. 09/13/18   [provider]  hydroxychloroquine (PLAQUENIL) 200 MG tablet Take 200 mg by mouth daily.    [provider]  mirtazapine (REMERON) 7.5 MG tablet Take 7.5 mg by mouth at bedtime. 01/14/19 01/14/20  [provider]  oxyCODONE-acetaminophen (PERCOCET) 5-325 MG tablet Take 1 tablet by mouth every 8 (eight) hours as needed. 09/12/19   Earleen Newport, MD  polyethylene glycol powder (GAVILAX) 17 GM/SCOOP powder Take 17 g by mouth daily. 09/13/18   [provider]  rivaroxaban (XARELTO) 20 MG TABS tablet Take 1 tablet (20 mg total) by mouth daily with supper. 09/12/19 10/28/20  Earleen Newport, MD  senna-docusate (SENOKOT-S) 8.6-50 MG tablet Take 2 tablets by mouth Nightly. 09/13/18   [provider]    Allergies Ace inhibitors  Family History  Problem Relation Age of Onset  . Arthritis Mother   . Hypertension Mother   . Breast cancer Neg Hx     Social History Social History   Tobacco Use  . Smoking status: Former Smoker    Packs/day: 0.30    Years: 4.00    Pack years: 1.20    Types: Cigarettes    Quit date:  12/30/2003    Years since quitting: 15.9  . Smokeless tobacco: Never Used  Substance Use Topics  . Alcohol use: No    Alcohol/week: 0.0 standard drinks  . Drug use: Yes    Types: Marijuana    Comment: 2 days ago.      Review of Systems  Constitutional: No fever/chills Eyes: No visual changes. No discharge ENT: No upper respiratory complaints. Cardiovascular: no chest pain. Respiratory: no cough. No SOB. Gastrointestinal: G-tube displacement.  No abdominal pain.  No nausea, no vomiting.  No diarrhea.  No constipation. Musculoskeletal: Negative for  musculoskeletal pain. Skin: Negative for rash, abrasions, lacerations, ecchymosis. Neurological: Negative for headaches, focal weakness or numbness. 10-point ROS otherwise negative.  ____________________________________________   PHYSICAL EXAM:  VITAL SIGNS: ED Triage Vitals  Enc Vitals Group     BP 12/04/19 1814 (!) 168/94     Pulse Rate 12/04/19 1814 99     Resp 12/04/19 1814 16     Temp 12/04/19 1814 99.7 F (37.6 C)     Temp Source 12/04/19 1814 Oral     SpO2 12/04/19 1814 100 %     Weight 12/04/19 1824 121 lb 4.1 oz (55 kg)     Height 12/04/19 1824 5\' 3"  (1.6 m)     Head Circumference --      Peak Flow --      Pain Score 12/04/19 1824 0     Pain Loc --      Pain Edu? --      Excl. in [place]? --      Constitutional: Alert and oriented. Well appearing and in no acute distress. Eyes: Conjunctivae are normal. PERRL. EOMI. Head: Atraumatic. ENT:      Ears:       Nose: No congestion/rhinnorhea.      Mouth/Throat: Mucous membranes are moist.  Neck: No stridor.    Cardiovascular: Normal rate, regular rhythm. Normal S1 and S2.  Good peripheral circulation. Respiratory: Normal respiratory effort without tachypnea or retractions. Lungs CTAB. Good air entry to the bases with no decreased or absent breath sounds. Gastrointestinal: Visualization of the external abdominal wall reveals stoma consistent with G-tube.  G-tube is obviously displaced.  No bleeding.  No foreign body.  Bowel sounds 4 quadrants. Soft and nontender to palpation. No guarding or rigidity. No palpable masses. No distention. No CVA tenderness. Musculoskeletal: Full range of motion to all extremities. No gross deformities appreciated. Neurologic:  Normal speech and language. No gross focal neurologic deficits are appreciated.  Skin:  Skin is warm, dry and intact. No rash noted. Psychiatric: Mood and affect are normal. Speech and behavior are normal. Patient exhibits appropriate insight and  judgement.   ____________________________________________   LABS (all labs ordered are listed, but only abnormal results are displayed)  Labs Reviewed - No data to display ____________________________________________  EKG   ____________________________________________  RADIOLOGY   No results found.  ____________________________________________    PROCEDURES  Procedure(s) performed:    FEEDING TUBE REPLACEMENT  Date/Time: 12/04/2019 8:26 PM Performed by: Darletta Moll, PA-C Authorized by: Darletta Moll, PA-C  Preparation: Patient was prepped and draped in the usual sterile fashion. Indications: tube dislodged Local anesthesia used: no  Anesthesia: Local anesthesia used: no  Sedation: Patient sedated: no  Tube type: gastrostomy Patient position: supine Procedure type: replacement Tube size: 16 Fr Endoscope used: no Tube placement difficulty: extreme Patient tolerance: patient tolerated the procedure well with no immediate complications Comments: Site is thoroughly cleansed using Betadine.  Prior to attempts, G-tube bulb is inflated with normal saline, then deflated.  Patient has external opening, however attempting replacement, there was no appreciable defect/stoma in which to insert G-tube.  I reattempted using a smaller [place] tube with same results.  There is no appreciable stoma in which to reintroduce the G-tube.       Medications - No data to display   ____________________________________________   INITIAL IMPRESSION / ASSESSMENT AND PLAN / ED COURSE  Pertinent labs & imaging results that were available during my care of the patient were reviewed by me and considered in my medical decision making (see chart for details).  Review of the [place] CSRS was performed in accordance of the [place] prior to dispensing any controlled drugs.           Patient's diagnosis is consistent with G-tube displacement.  Patient presented to  emergency department complaining that her G-tube had dislodged.  There is some confusion on how long this occurred.  Initially patient told me this occurred right before arrival, and it was earlier this morning.  Patient initially told me that she did not use the G-tube for feeding or medications but then she told me that she did.  At this point as there was so much confusion I reached out to the patient's emergency contact.  Emergency contact, Phineas Real advises me that the patient does not in fact use her G-tube.  The emergency contact states that she knows that the tube had been displaced at least as of this morning, possibly earlier.  On my attempts at reinsertion, the external opening of the skin was still patent however there does not appear to be an appreciable opening for placement of the G-tube as far as stoma is concerned.  I used 2 different tubes both the same size as the one displaced, and a smaller size to see if I could place a replacement G-tube.  Even with significant manipulation/exploration, there does not appear to be intact stoma.  I feel that the G-tube likely has been out for greater than 12 to 16 hours based on what the patient tells me, but possibly longer.  At this time as patient does not use the G-tube for feeding or medications, has been seen multiple times are concerned that it had not been working, I feel that the best option is to leave it out.  If patient starts developing difficulty swallowing she can always talk to surgery or IR about replacement.  However there is no indication for urgent replacement as patient eats and drinks appropriately and can take her own medications without the G-tube..  I have instructed the patient on wound care.  I had cleansed the area thoroughly prior to attempts.  Edges of stoma are approximated using Steri-Strips.  Follow-up with primary care.  Patient is given ED precautions to return to the ED for any worsening or new  symptoms.     ____________________________________________  FINAL CLINICAL IMPRESSION(S) / ED DIAGNOSES  Final diagnoses:  Gastrojejunostomy tube dislodgement      NEW MEDICATIONS STARTED DURING THIS VISIT:  ED Discharge Orders    None          This chart was dictated using voice recognition software/Dragon. Despite best efforts to proofread, errors can occur which can change the meaning. Any change was purely unintentional.    Darletta Moll, PA-C 12/04/19 2359    Nance Pear, MD 12/05/19 [phone]

## 2019-12-04 NOTE — ED Triage Notes (Signed)
Pt needs g-tube replaced. Pt reports hers fell out today. Pt has g-tube with her

## 2020-03-18 IMAGING — DX DG FOOT COMPLETE 3+V*L*
3 series · 3 of 3 positions shown · non-contrast
Comparison: No prior.

CLINICAL DATA: Swelling.  No known injury.

EXAM:
LEFT FOOT - COMPLETE 3+ VIEW

[foot ap]
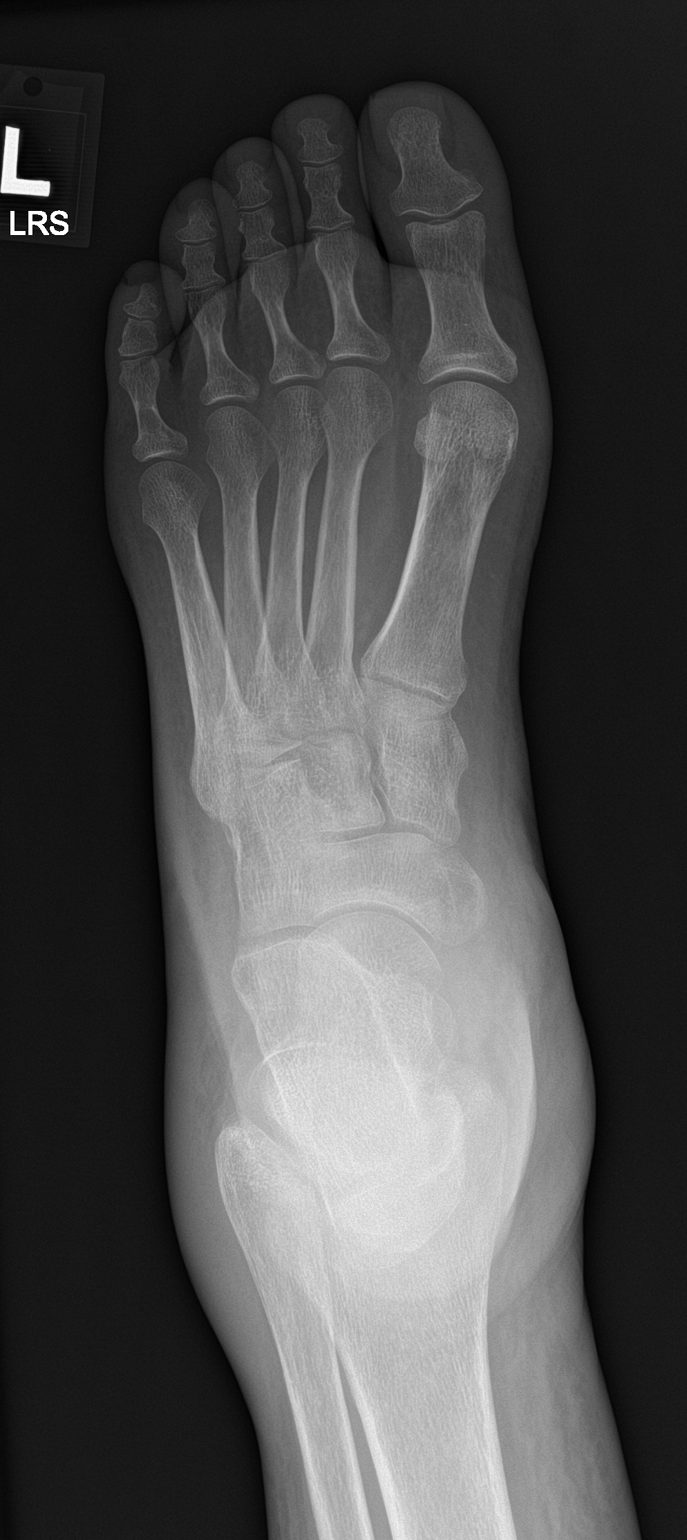

[foot obl]
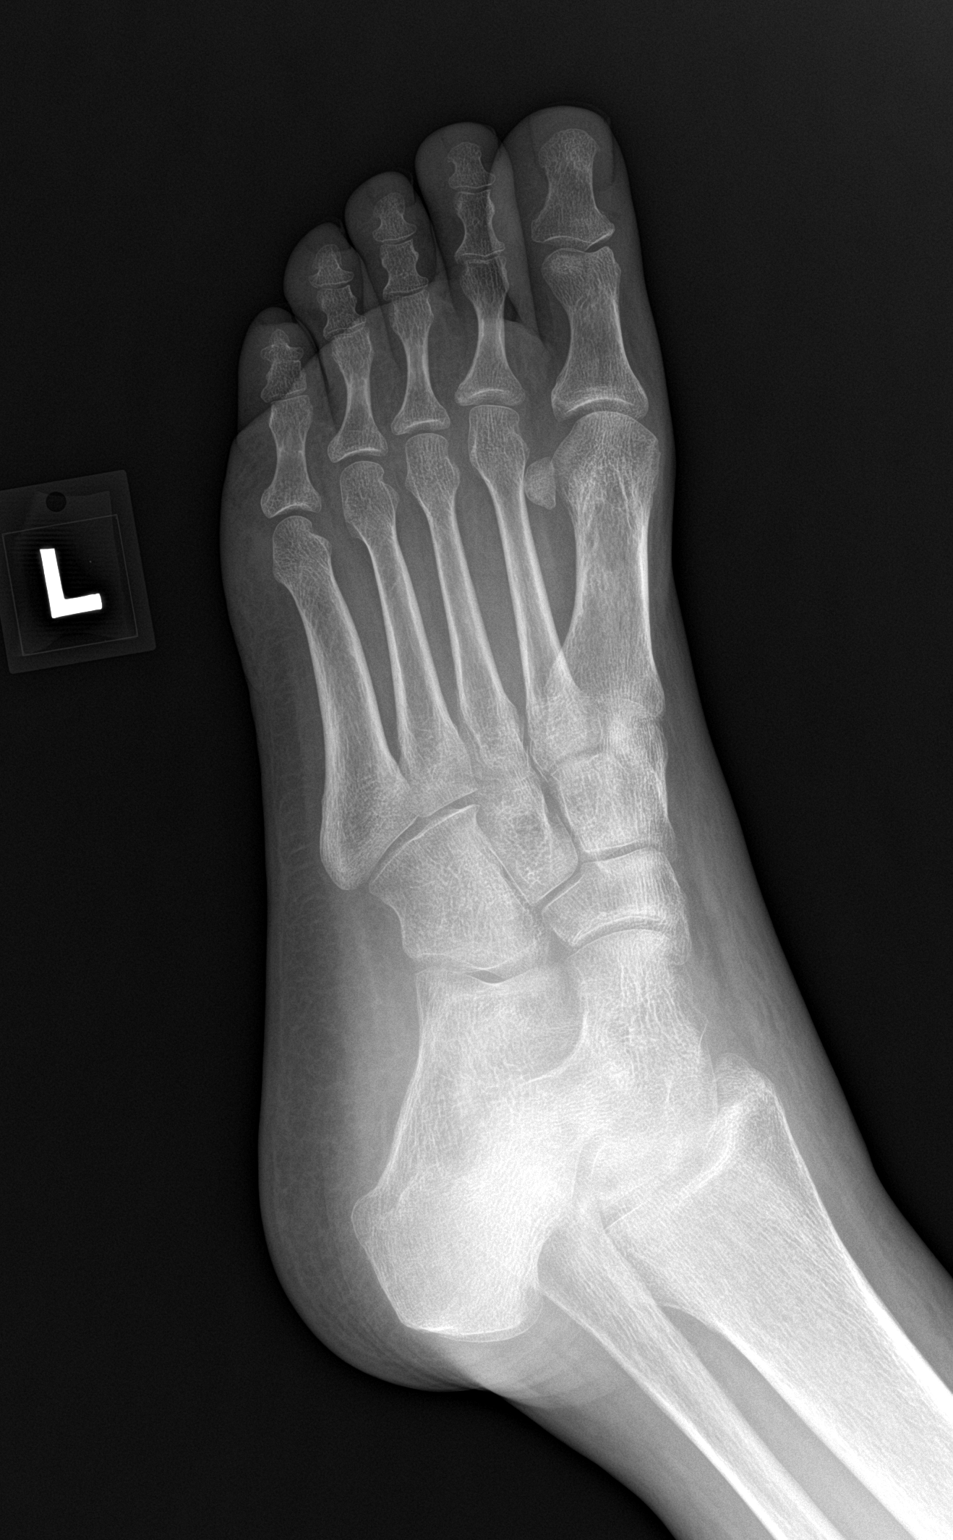

[foot lat]
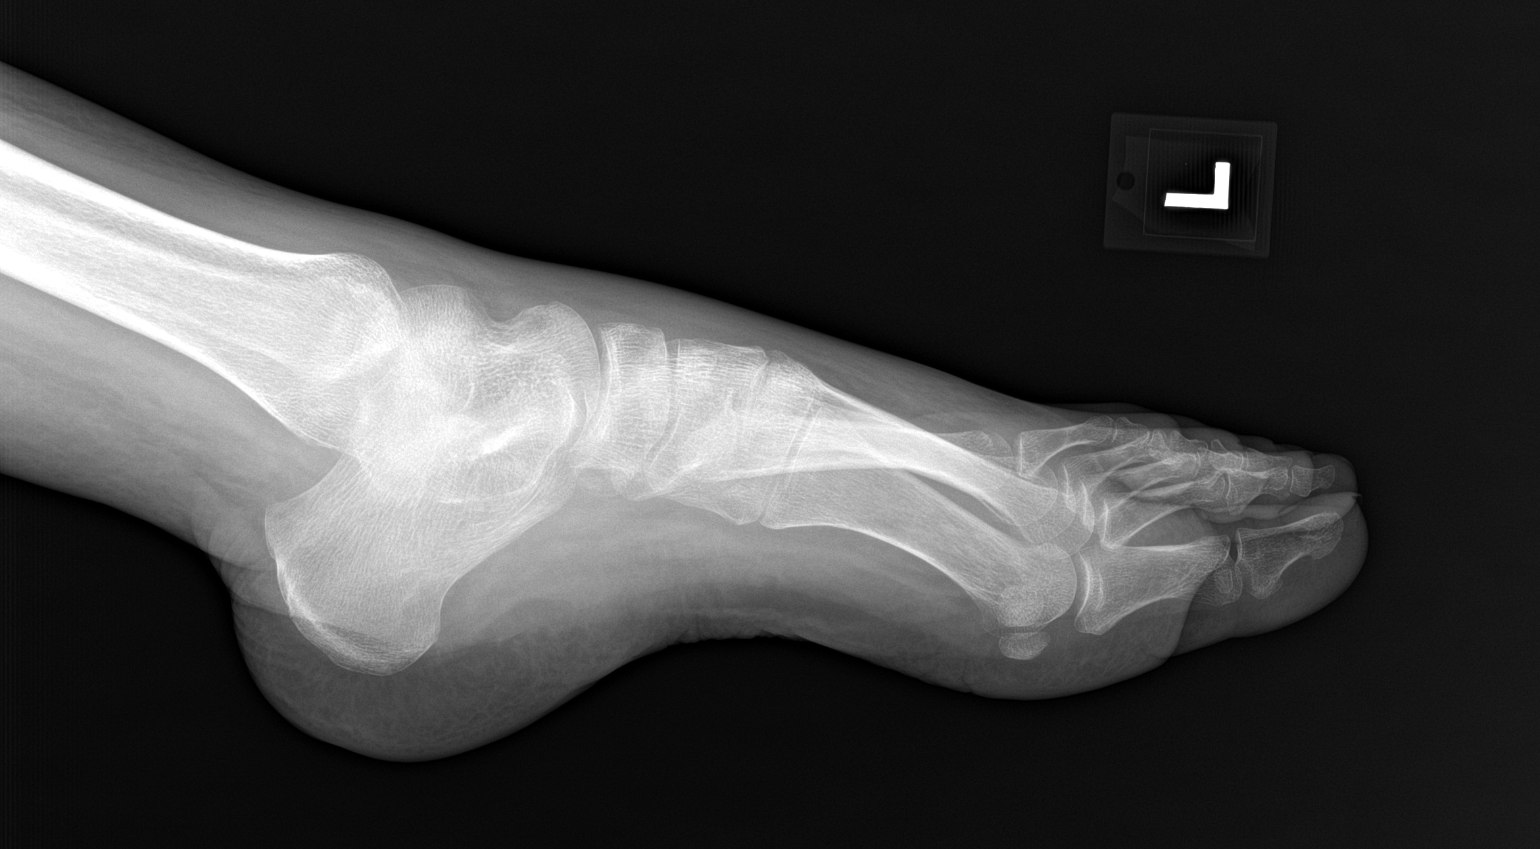

[3 of 3 positions shown; findings below may reference images not displayed]

FINDINGS: No acute bony or joint abnormality. No evidence of fracture
dislocation. Soft tissue swelling. No radiopaque foreign body.
IMPRESSION: 1.  Soft tissue swelling.  No radiopaque foreign body.

2.  No acute bony abnormality.

## 2020-03-18 IMAGING — DX DG ANKLE COMPLETE 3+V*L*
3 series · 3 of 3 positions shown · non-contrast
Comparison: None.

CLINICAL DATA: Pain and swelling

EXAM:
LEFT ANKLE COMPLETE - 3+ VIEW

[ankle ap]
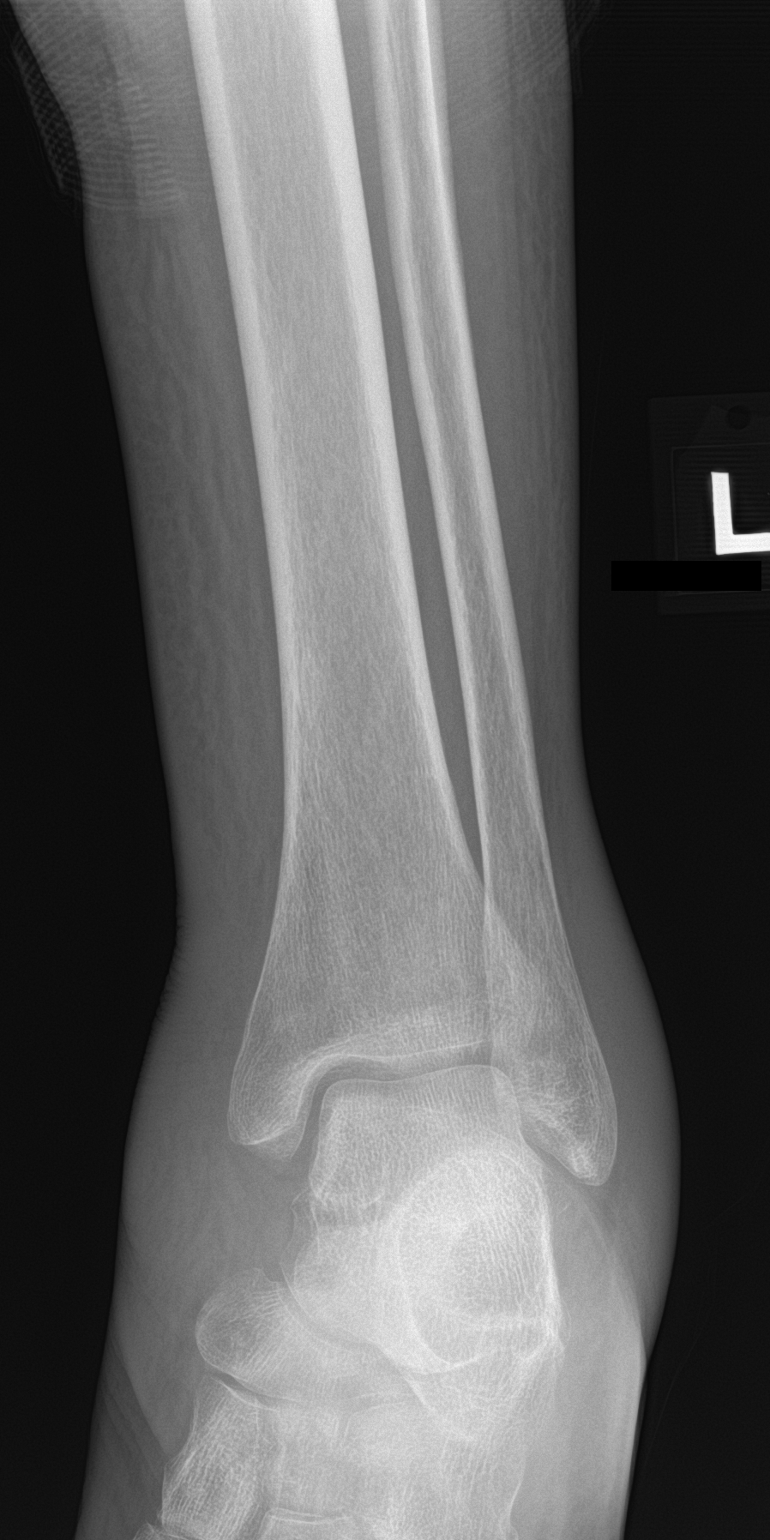

[ankle obl]
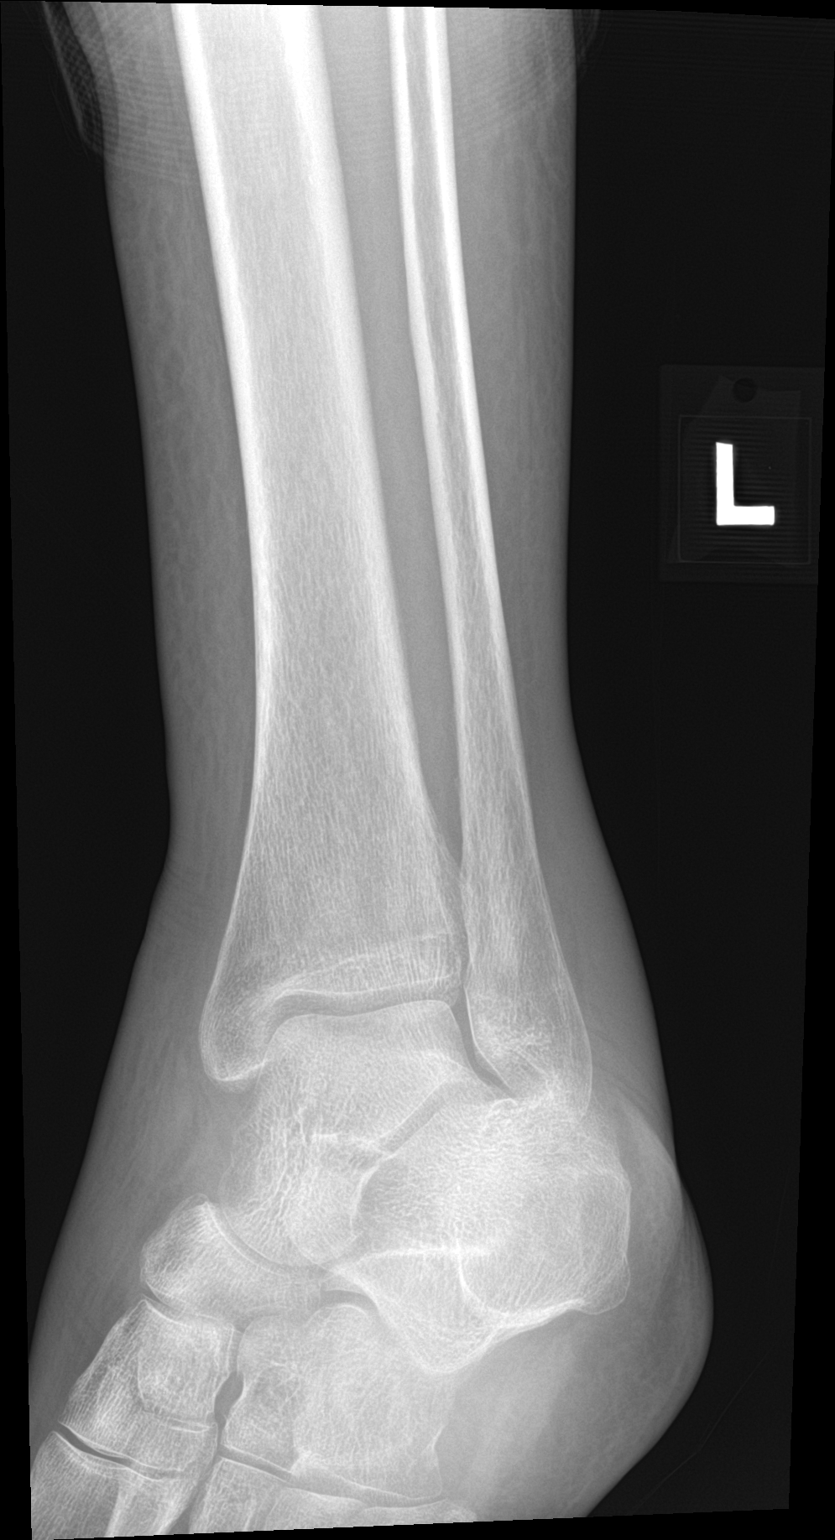

[ankle lat]
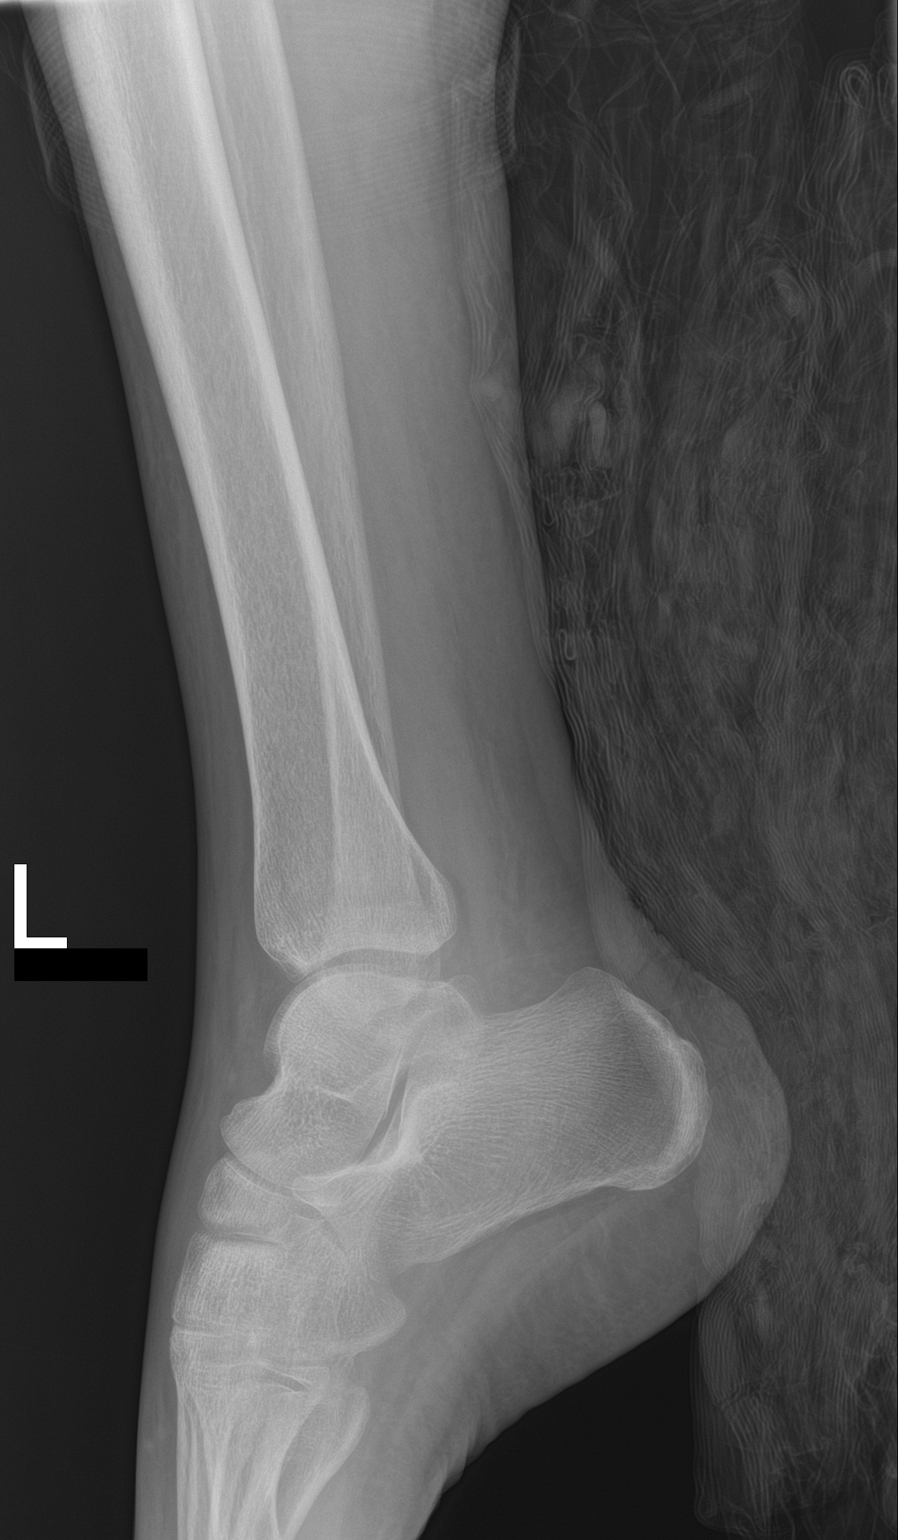

[3 of 3 positions shown; findings below may reference images not displayed]

FINDINGS: There is soft tissue swelling at the ankle, medial greater than
lateral. There is no acute fracture. Joint space is preserved. No
intrinsic osseous lesion.
IMPRESSION: Soft tissue swelling.  No acute osseous abnormality

## 2020-09-01 ENCOUNTER — Emergency Department: Payer: Medicare Other

## 2020-09-01 ENCOUNTER — Emergency Department
Admission: EM | Admit: 2020-09-01 | Discharge: 2020-09-01 | Disposition: A | Discharge status: Home / Self Care | Payer: Medicare Other | Attending: Student in an Organized Health Care Education/Training Program | Admitting: Student in an Organized Health Care Education/Training Program

## 2020-09-01 ENCOUNTER — Other Ambulatory Visit: Payer: Self-pay

## 2020-09-01 DIAGNOSIS — Z7901 Long term (current) use of anticoagulants: Secondary | ICD-10-CM | POA: Diagnosis not present

## 2020-09-01 DIAGNOSIS — R55 Syncope and collapse: Secondary | ICD-10-CM | POA: Diagnosis present

## 2020-09-01 DIAGNOSIS — Z79899 Other long term (current) drug therapy: Secondary | ICD-10-CM | POA: Diagnosis not present

## 2020-09-01 DIAGNOSIS — Z8673 Personal history of transient ischemic attack (TIA), and cerebral infarction without residual deficits: Secondary | ICD-10-CM | POA: Insufficient documentation

## 2020-09-01 DIAGNOSIS — E860 Dehydration: Secondary | ICD-10-CM

## 2020-09-01 DIAGNOSIS — Z87891 Personal history of nicotine dependence: Secondary | ICD-10-CM | POA: Insufficient documentation

## 2020-09-01 DIAGNOSIS — I10 Essential (primary) hypertension: Secondary | ICD-10-CM | POA: Insufficient documentation

## 2020-09-01 LAB — BASIC METABOLIC PANEL
Anion gap: 8 (ref 5–15)
BUN: 12 mg/dL (ref 6–20)
CO2: 24 mmol/L (ref 22–32)
Calcium: 9.3 mg/dL (ref 8.9–10.3)
Chloride: 105 mmol/L (ref 98–111)
Creatinine, Ser: 0.87 mg/dL (ref 0.44–1.00)
GFR, Estimated: >60 mL/min (ref >60)
Glucose, Bld: 87 mg/dL (ref 70–99)
Potassium: 3.7 mmol/L (ref 3.5–5.1)
Sodium: 137 mmol/L (ref 135–145)

## 2020-09-01 LAB — URINALYSIS, COMPLETE (UACMP) WITH MICROSCOPIC
Bacteria, UA: NONE SEEN
Bilirubin Urine: NEGATIVE
Glucose, UA: NEGATIVE mg/dL
Ketones, ur: NEGATIVE mg/dL
Leukocytes,Ua: NEGATIVE
Nitrite: NEGATIVE
Protein, ur: NEGATIVE mg/dL
Specific Gravity, Urine: 1.015 (ref 1.005–1.030)
pH: 7.5 (ref 5.0–8.0)

## 2020-09-01 LAB — CBC
HCT: 35.7 % — ABNORMAL LOW (ref 36.0–46.0)
Hemoglobin: 11.9 g/dL — ABNORMAL LOW (ref 12.0–15.0)
MCH: 31.8 pg (ref 26.0–34.0)
MCHC: 33.3 g/dL (ref 30.0–36.0)
MCV: 95.5 fL (ref 80.0–100.0)
Platelets: 174 10*3/uL (ref 150–400)
RBC: 3.74 MIL/uL — ABNORMAL LOW (ref 3.87–5.11)
RDW: 12.4 % (ref 11.5–15.5)
WBC: 13.2 10*3/uL — ABNORMAL HIGH (ref 4.0–10.5)
nRBC: 0 % (ref 0.0–0.2)

## 2020-09-01 LAB — TROPONIN I (HIGH SENSITIVITY)
Troponin I (High Sensitivity): 2 ng/L (ref <18)
Troponin I (High Sensitivity): 3 ng/L (ref <18)

## 2020-09-01 NOTE — ED Triage Notes (Signed)
Weakness, near syncope while getting up from bed X 1. Questionable seizure? With hx of seizure X 1 in past. Hx of stroke, uses wheelchair at baseline. Aphasia at baseline.  Urinary incontinence noted, unlike pt baseline.   114CBG. 174/106BP, 68HR. 20G to right wrist, saline lock.

## 2020-09-01 NOTE — ED Notes (Signed)
Second light green top collected and sent to lab.

## 2020-09-01 NOTE — ED Provider Notes (Signed)
Assumed care from Dr. Quentin Cornwall. Briefly, 55 yo F here with syncopal episode. Unclear etiology but favor orthostasis or vagal per Dr. Quentin Cornwall. Labs reassuring, trop neg x 2, EKG nonischemic. However, pt has h/o CVA and had a syncopal episode with that CVA, so plan is to obtain MR. If negative, D/C home.  MR reviewed and shows old infarct w/o acute changes. VSS. Pt is at her mental baseline, feels well. No ectopy on tele. Will d/c with outpt follow-up.   Valerie Bruce, MD 09/01/20 2107

## 2020-09-01 NOTE — ED Notes (Signed)
Pt to MRI

## 2020-09-01 NOTE — ED Notes (Signed)
Pt to CT

## 2020-09-01 NOTE — ED Notes (Signed)
Family at bedside. 

## 2020-09-01 NOTE — ED Notes (Addendum)
pericare and changed on arrival from incontinence, not pt baseline.pt has not taken any of her AM meds this morning. Reports only hx of seizure X 1 and that was at time of stroke, does not take any seizure medications.

## 2020-09-01 NOTE — ED Provider Notes (Signed)
[facility] Emergency Department Provider Note    Event Date/Time   First MD Initiated Contact with Patient 09/01/20 1126     (approximate)  I have reviewed the triage vital signs and the nursing notes.   HISTORY  Chief Complaint Weakness    HPI Valerie Bell is a 55 y.o. female with the below listed past medical history presents to the ER for evaluation of near syncopal episode with no reported seizure-like activity.  Has a history of stroke as well as single seizure-like activity not on AED.  Reportedly went back to baseline.  She denies any pain.  Did not bite her tongue.  Denies any loss of bladder control.  Denies any chest pain or shortness of breath.    Past Medical History:  Diagnosis Date  . Anxiety   . Collagen vascular disease ([place])   . DVT (deep venous thrombosis) ([place])   . Hypertension   . Lupus (systemic lupus erythematosus) ([place])   . Spinal cord lesion ([place])   . Stroke [facility])    Family History  Problem Relation Age of Onset  . Arthritis Mother   . Hypertension Mother   . Breast cancer Neg Hx    Past Surgical History:  Procedure Laterality Date  . ABDOMINAL HYSTERECTOMY    . BREAST BIOPSY Right 17+ years ago   negative Core Bx   Patient Active Problem List   Diagnosis Date Noted  . Pressure injury of skin 09/02/2018  . Acute encephalopathy 08/27/2018  . Encephalitis 08/27/2018  . Benign essential HTN 08/27/2018  . Leucocytosis 08/27/2018  . Lupus ([place]) 08/27/2018  . DVT (deep vein thrombosis) in pregnancy 08/27/2018  . Lethargy 08/24/2018  . Chest pain 03/30/2017      Prior to Admission medications   Medication Sig Start Date End Date Taking? Authorizing Provider  atorvastatin (LIPITOR) 80 MG tablet Take 80 mg by mouth daily.    [provider]  heparin 25000-0.45 UT/250ML-% infusion Inject 700 Units/hr into the vein continuous. 08/25/18   Dustin Flock, MD  hydrocortisone 1 % ointment Apply 1 application  topically 2 (two) times daily. 09/13/18   [provider]  hydroxychloroquine (PLAQUENIL) 200 MG tablet Take 200 mg by mouth daily.    [provider]  mirtazapine (REMERON) 7.5 MG tablet Take 7.5 mg by mouth at bedtime. 01/14/19 01/14/20  [provider]  oxyCODONE-acetaminophen (PERCOCET) 5-325 MG tablet Take 1 tablet by mouth every 8 (eight) hours as needed. 09/12/19   Earleen Newport, MD  polyethylene glycol powder (GAVILAX) 17 GM/SCOOP powder Take 17 g by mouth daily. 09/13/18   [provider]  rivaroxaban (XARELTO) 20 MG TABS tablet Take 1 tablet (20 mg total) by mouth daily with supper. 09/12/19 10/28/20  Earleen Newport, MD  senna-docusate (SENOKOT-S) 8.6-50 MG tablet Take 2 tablets by mouth Nightly. 09/13/18   [provider]    Allergies Ace inhibitors    Social History Social History   Tobacco Use  . Smoking status: Former Smoker    Packs/day: 0.30    Years: 4.00    Pack years: 1.20    Types: Cigarettes    Quit date: 12/30/2003    Years since quitting: 16.6  . Smokeless tobacco: Never Used  Substance Use Topics  . Alcohol use: No    Alcohol/week: 0.0 standard drinks  . Drug use: Yes    Types: Marijuana    Comment: 2 days ago.     Review of Systems  Patient denies headaches, rhinorrhea, blurry vision, numbness, shortness of breath, chest pain, edema, cough, abdominal pain, nausea, vomiting, diarrhea, dysuria, fevers, rashes or hallucinations unless otherwise stated above in HPI. ____________________________________________   PHYSICAL EXAM:  VITAL SIGNS: Vitals:   09/01/20 1347 09/01/20 1430  BP: (!) 150/99 (!) 149/96  Pulse: 79 69  Resp: 16 11  Temp:    SpO2: 100% 99%    Constitutional: Alert and oriented.  Eyes: Conjunctivae are normal.  Head: Atraumatic. Nose: No congestion/rhinnorhea. Mouth/Throat: Mucous membranes are moist.   Neck: No stridor. Painless ROM.  Cardiovascular: Normal rate, regular  rhythm. Grossly normal heart sounds.  Good peripheral circulation. Respiratory: Normal respiratory effort.  No retractions. Lungs CTAB. Gastrointestinal: Soft and nontender. No distention. No abdominal bruits. No CVA tenderness. Genitourinary:  Musculoskeletal: No lower extremity tenderness nor edema.  No joint effusions. Neurologic:  Normal speech and language. No gross focal neurologic deficits are appreciated. No facial droop Skin:  Skin is warm, dry and intact. No rash noted. Psychiatric: Mood and affect are normal. Speech and behavior are normal.  ____________________________________________   LABS (all labs ordered are listed, but only abnormal results are displayed)  Results for orders placed or performed during the hospital encounter of 09/01/20 (from the past 24 hour(s))  Basic metabolic panel     Status: None   Collection Time: 09/01/20 11:05 AM  Result Value Ref Range   Sodium 137 135 - 145 mmol/L   Potassium 3.7 3.5 - 5.1 mmol/L   Chloride 105 98 - 111 mmol/L   CO2 24 22 - 32 mmol/L   Glucose, Bld 87 70 - 99 mg/dL   BUN 12 6 - 20 mg/dL   Creatinine, Ser 0.87 0.44 - 1.00 mg/dL   Calcium 9.3 8.9 - 10.3 mg/dL   GFR, Estimated >60 >60 mL/min   Anion gap 8 5 - 15  CBC     Status: Abnormal   Collection Time: 09/01/20 11:05 AM  Result Value Ref Range   WBC 13.2 (H) 4.0 - 10.5 K/uL   RBC 3.74 (L) 3.87 - 5.11 MIL/uL   Hemoglobin 11.9 (L) 12.0 - 15.0 g/dL   HCT 35.7 (L) 36.0 - 46.0 %   MCV 95.5 80.0 - 100.0 fL   MCH 31.8 26.0 - 34.0 pg   MCHC 33.3 30.0 - 36.0 g/dL   RDW 12.4 11.5 - 15.5 %   Platelets 174 150 - 400 K/uL   nRBC 0.0 0.0 - 0.2 %  Troponin I (High Sensitivity)     Status: None   Collection Time: 09/01/20 11:05 AM  Result Value Ref Range   Troponin I (High Sensitivity) 2 <18 ng/L  Urinalysis, Complete w Microscopic Urine, Catheterized     Status: Abnormal   Collection Time: 09/01/20  1:15 PM  Result Value Ref Range   Color, Urine YELLOW YELLOW    APPearance CLEAR CLEAR   Specific Gravity, Urine 1.015 1.005 - 1.030   pH 7.5 5.0 - 8.0   Glucose, UA NEGATIVE NEGATIVE mg/dL   Hgb urine dipstick SMALL (A) NEGATIVE   Bilirubin Urine NEGATIVE NEGATIVE   Ketones, ur NEGATIVE NEGATIVE mg/dL   Protein, ur NEGATIVE NEGATIVE mg/dL   Nitrite NEGATIVE NEGATIVE   Leukocytes,Ua NEGATIVE NEGATIVE   Squamous Epithelial / LPF 0-5 0 - 5   WBC, UA 0-5 0 - 5 WBC/hpf   RBC / HPF 0-5 0 - 5 RBC/hpf   Bacteria, UA NONE SEEN NONE SEEN   ____________________________________________  EKG My review  and personal interpretation at Time: 10:58   Indication: near syncope  Rate: 75  Rhythm: sinus Axis: normal Other: normal intervals, no stemi ____________________________________________  RADIOLOGY  I personally reviewed all radiographic images ordered to evaluate for the above acute complaints and reviewed radiology reports and findings.  These findings were personally discussed with the patient.  Please see medical record for radiology report.  ____________________________________________   PROCEDURES  Procedure(s) performed:  Procedures    Critical Care performed: no ____________________________________________   INITIAL IMPRESSION / ASSESSMENT AND PLAN / ED COURSE  Pertinent labs & imaging results that were available during my care of the patient were reviewed by me and considered in my medical decision making (see chart for details).   DDX: Dysrhythmia, ACS, CVA, TIA, seizure, electrolyte abnormality, dehydration, orthostasis, anemia  Valerie Bell is a 55 y.o. who presents to the ED with presentation as described above.  Patient arrives back at her baseline.  She denies any complaints right now but reports similar presentation related to her previous stroke in the past.  Her EKG is nonischemic nonspecific changes.  Patient will be placed on monitor and troponins ordered to further evaluate for ischemia.  CT head shows no acute abnormality.   Blood work otherwise reassuring.  Not convinced this is seizure activity.  Based on her history will order MRI to further evaluate.  Patient will be signed out to oncoming physician.  Anticipate she will be appropriate for discharge home if MRI and blood work remains negative.     The patient was evaluated in Emergency Department today for the symptoms described in the history of present illness. He/she was evaluated in the context of the global COVID-19 pandemic, which necessitated consideration that the patient might be at risk for infection with the SARS-CoV-2 virus that causes COVID-19. Institutional protocols and algorithms that pertain to the evaluation of patients at risk for COVID-19 are in a state of rapid change based on information released by regulatory bodies including the CDC and federal and state organizations. These policies and algorithms were followed during the patient's care in the ED.  As part of my medical decision making, I reviewed the following data within the [place] notes reviewed and incorporated, Labs reviewed, notes from prior ED visits and [place] Controlled Substance Database   ____________________________________________   FINAL CLINICAL IMPRESSION(S) / ED DIAGNOSES  Final diagnoses:  Near syncope      NEW MEDICATIONS STARTED DURING THIS VISIT:  New Prescriptions   No medications on file     Note:  This document was prepared using Dragon voice recognition software and may include unintentional dictation errors.   Merlyn Lot, MD 09/01/20 1531

## 2020-09-01 NOTE — ED Notes (Signed)
Urinary incontinence noted again, cleaned and brief changed. In and out cath performed.

## 2020-09-01 NOTE — ED Notes (Signed)
ED Provider at bedside. 

## 2020-09-10 ENCOUNTER — Other Ambulatory Visit: Payer: Self-pay

## 2020-09-10 ENCOUNTER — Emergency Department: Payer: Medicare Other

## 2020-09-10 ENCOUNTER — Emergency Department
Admission: EM | Admit: 2020-09-10 | Discharge: 2020-09-11 | Disposition: A | Discharge status: Home / Self Care | Payer: Medicare Other | Attending: Emergency Medicine | Admitting: Emergency Medicine

## 2020-09-10 DIAGNOSIS — Z86718 Personal history of other venous thrombosis and embolism: Secondary | ICD-10-CM | POA: Diagnosis not present

## 2020-09-10 DIAGNOSIS — Z87891 Personal history of nicotine dependence: Secondary | ICD-10-CM | POA: Diagnosis not present

## 2020-09-10 DIAGNOSIS — L03011 Cellulitis of right finger: Secondary | ICD-10-CM | POA: Diagnosis not present

## 2020-09-10 DIAGNOSIS — Z7901 Long term (current) use of anticoagulants: Secondary | ICD-10-CM | POA: Insufficient documentation

## 2020-09-10 DIAGNOSIS — Z79899 Other long term (current) drug therapy: Secondary | ICD-10-CM | POA: Insufficient documentation

## 2020-09-10 DIAGNOSIS — I10 Essential (primary) hypertension: Secondary | ICD-10-CM | POA: Diagnosis not present

## 2020-09-10 DIAGNOSIS — M79644 Pain in right finger(s): Secondary | ICD-10-CM | POA: Diagnosis present

## 2020-09-10 DIAGNOSIS — Z8673 Personal history of transient ischemic attack (TIA), and cerebral infarction without residual deficits: Secondary | ICD-10-CM | POA: Insufficient documentation

## 2020-09-10 MED ORDER — CLINDAMYCIN HCL 150 MG PO CAPS
300.0000 mg | ORAL_CAPSULE | Freq: Once | ORAL | Status: AC
  Administered 2020-09-11: 300 mg via ORAL
  Filled 2020-09-10: qty 2

## 2020-09-10 MED ORDER — IBUPROFEN 600 MG PO TABS
600.0000 mg | ORAL_TABLET | Freq: Once | ORAL | Status: AC
  Administered 2020-09-11: 600 mg via ORAL
  Filled 2020-09-10: qty 1

## 2020-09-10 NOTE — ED Triage Notes (Signed)
R hand pain without fall or injury. Reports onset this morning and has progressively worsened throughout day. No swelling or deformity noted. CSM intact.

## 2020-09-11 DIAGNOSIS — L03011 Cellulitis of right finger: Secondary | ICD-10-CM | POA: Diagnosis not present

## 2020-09-11 MED ORDER — CLINDAMYCIN HCL 300 MG PO CAPS: 300.0000 mg | ORAL_CAPSULE | Freq: Three times a day (TID) | ORAL | 0 refills | Status: AC

## 2020-09-11 NOTE — Discharge Instructions (Addendum)
Take clindamycin 300 mg 3 times a day for 10 days for the infection in your finger.  You may take 600 mg of ibuprofen every 6 hours for pain.  Make sure to take it with food to prevent an upset stomach.  Follow-up with your primary care doctor on Monday or Tuesday for reevaluation.  Return to the emergency room if the redness and the swelling are expanding down the finger into the palm of the hand, if you have worsening pain, or if you develop a fever.  As these may be signs of a worse infection that will require IV antibiotics.

## 2020-09-11 NOTE — ED Provider Notes (Signed)
[facility] Emergency Department Provider Note  ____________________________________________  Time seen: Approximately 12:19 AM  I have reviewed the triage vital signs and the nursing notes.   HISTORY  Chief Complaint Hand Pain   HPI Valerie Bell is a 55 y.o. female with history of collagen vascular disease, lupus, stroke, hypertension, DVT on Xarelto who presents for evaluation of right finger pain.  Pain started today.  Patient noticed swelling and redness of the right index finger.  No trauma.  Has not taken anything at home for the pain.  No fever or chills.  No pain with flexion or extension of the finger.   Past Medical History:  Diagnosis Date  . Anxiety   . Collagen vascular disease ([place])   . DVT (deep venous thrombosis) ([place])   . Hypertension   . Lupus (systemic lupus erythematosus) ([place])   . Spinal cord lesion ([place])   . Stroke [facility])     Patient Active Problem List   Diagnosis Date Noted  . Pressure injury of skin 09/02/2018  . Acute encephalopathy 08/27/2018  . Encephalitis 08/27/2018  . Benign essential HTN 08/27/2018  . Leucocytosis 08/27/2018  . Lupus ([place]) 08/27/2018  . DVT (deep vein thrombosis) in pregnancy 08/27/2018  . Lethargy 08/24/2018  . Chest pain 03/30/2017    Past Surgical History:  Procedure Laterality Date  . ABDOMINAL HYSTERECTOMY    . BREAST BIOPSY Right 17+ years ago   negative Core Bx    Prior to Admission medications   Medication Sig Start Date End Date Taking? Authorizing Provider  clindamycin (CLEOCIN) 300 MG capsule Take 1 capsule (300 mg total) by mouth 3 (three) times daily for 10 days. 09/11/20 09/21/20 Yes Wenda Vanschaick, [place], MD  atorvastatin (LIPITOR) 80 MG tablet Take 80 mg by mouth daily.    [provider]  heparin 25000-0.45 UT/250ML-% infusion Inject 700 Units/hr into the vein continuous. 08/25/18   Dustin Flock, MD  hydrocortisone 1 % ointment Apply 1 application topically 2 (two)  times daily. 09/13/18   [provider]  hydroxychloroquine (PLAQUENIL) 200 MG tablet Take 200 mg by mouth daily.    [provider]  mirtazapine (REMERON) 7.5 MG tablet Take 7.5 mg by mouth at bedtime. 01/14/19 01/14/20  [provider]  oxyCODONE-acetaminophen (PERCOCET) 5-325 MG tablet Take 1 tablet by mouth every 8 (eight) hours as needed. 09/12/19   Earleen Newport, MD  polyethylene glycol powder (GAVILAX) 17 GM/SCOOP powder Take 17 g by mouth daily. 09/13/18   [provider]  rivaroxaban (XARELTO) 20 MG TABS tablet Take 1 tablet (20 mg total) by mouth daily with supper. 09/12/19 10/28/20  Earleen Newport, MD  senna-docusate (SENOKOT-S) 8.6-50 MG tablet Take 2 tablets by mouth Nightly. 09/13/18   [provider]    Allergies Ace inhibitors  Family History  Problem Relation Age of Onset  . Arthritis Mother   . Hypertension Mother   . Breast cancer Neg Hx     Social History Social History   Tobacco Use  . Smoking status: Former Smoker    Packs/day: 0.30    Years: 4.00    Pack years: 1.20    Types: Cigarettes    Quit date: 12/30/2003    Years since quitting: 16.7  . Smokeless tobacco: Never Used  Substance Use Topics  . Alcohol use: No    Alcohol/week: 0.0 standard drinks  . Drug use: Yes    Types: Marijuana    Comment: 2 days ago.  Review of Systems  Constitutional: Negative for fever. Eyes: Negative for visual changes. ENT: Negative for sore throat. Neck: No neck pain  Cardiovascular: Negative for chest pain. Respiratory: Negative for shortness of breath. Gastrointestinal: Negative for abdominal pain, vomiting or diarrhea. Genitourinary: Negative for dysuria. Musculoskeletal: Negative for back pain. + R index finger pain Skin: Negative for rash. Neurological: Negative for headaches, weakness or numbness. Psych: No SI or HI  ____________________________________________   PHYSICAL EXAM:  VITAL SIGNS: ED  Triage Vitals  Enc Vitals Group     BP 09/10/20 2330 (!) 183/117     Pulse Rate 09/10/20 2330 95     Resp 09/10/20 2330 18     Temp 09/10/20 2330 98.7 F (37.1 C)     Temp Source 09/10/20 2330 Oral     SpO2 09/10/20 2330 98 %     Weight 09/10/20 2331 130 lb (59 kg)     Height 09/10/20 2331 5\' 3"  (1.6 m)     Head Circumference --      Peak Flow --      Pain Score 09/10/20 2331 10     Pain Loc --      Pain Edu? --      Excl. in [place]? --     Constitutional: Alert and oriented. Well appearing and in no apparent distress. HEENT:      Head: Normocephalic and atraumatic.         Eyes: Conjunctivae are normal. Sclera is non-icteric.       Mouth/Throat: Mucous membranes are moist.       Neck: Supple with no signs of meningismus. Cardiovascular: Regular rate and rhythm.  Respiratory: Normal respiratory effort. Lungs are clear to auscultation bilaterally.  Musculoskeletal:  R finger is slightly swollen on the distal pad with erythema and warmth, no tracking over the flexor tendons of the hand. No crepitus or bullae. No open wounds.  Neurologic: Normal speech and language. Face is symmetric. Moving all extremities. No gross focal neurologic deficits are appreciated. Skin: Skin is warm, dry and intact. No rash noted. Psychiatric: Mood and affect are normal. Speech and behavior are normal.      ____________________________________________   LABS (all labs ordered are listed, but only abnormal results are displayed)  Labs Reviewed - No data to display ____________________________________________  EKG  none  ____________________________________________  RADIOLOGY  I have personally reviewed the images performed during this visit and I agree with the Radiologist's read.   Interpretation by Radiologist:  DG Hand 2 View Right  Result Date: 09/11/2020 CLINICAL DATA:  Hand pain, no known injury, initial encounter EXAM: RIGHT HAND - 2 VIEW COMPARISON:  None. FINDINGS: There is no  evidence of fracture or dislocation. There is no evidence of arthropathy or other focal bone abnormality. Soft tissues are unremarkable. IMPRESSION: No acute abnormality noted. Electronically Signed   By: Inez Catalina M.D.   On: 09/11/2020 00:12     ____________________________________________   PROCEDURES  Procedure(s) performed: None Procedures Critical Care performed:  None ____________________________________________   INITIAL IMPRESSION / ASSESSMENT AND PLAN / ED COURSE   55 y.o. female with history of collagen vascular disease, lupus, stroke, hypertension, DVT on Xarelto who presents for evaluation of right finger pain.  Right index finger is slightly swollen with erythema and warmth of the distal pad of the finger with no open wounds, no tracking of the erythema towards the palm or over the flexor tendon.  There is no tenderness to palpation over the flexor  tendons of the hand.  No systemic signs of infection.  Presentation is concerning for localized cellulitis of the right index finger.  Will start patient on clindamycin and close follow-up with PCP.  Discussed my standard return precautions for any worsening pain or swelling, or expansion of the area of erythema and warmth, or presence of any systemic signs.  History is gathered from patient and her roommate who is patient's health care provider.  Recommendations discussed with both of them      _____________________________________________ Please note:  Patient was evaluated in Emergency Department today for the symptoms described in the history of present illness. Patient was evaluated in the context of the global COVID-19 pandemic, which necessitated consideration that the patient might be at risk for infection with the SARS-CoV-2 virus that causes COVID-19. Institutional protocols and algorithms that pertain to the evaluation of patients at risk for COVID-19 are in a state of rapid change based on information released by  regulatory bodies including the CDC and federal and state organizations. These policies and algorithms were followed during the patient's care in the ED.  Some ED evaluations and interventions may be delayed as a result of limited staffing during the pandemic.   [place] Controlled Substance Database was reviewed by me. ____________________________________________   FINAL CLINICAL IMPRESSION(S) / ED DIAGNOSES   Final diagnoses:  Cellulitis of finger of right hand      NEW MEDICATIONS STARTED DURING THIS VISIT:  ED Discharge Orders         Ordered    clindamycin (CLEOCIN) 300 MG capsule  3 times daily        09/11/20 0023           Note:  This document was prepared using Dragon voice recognition software and may include unintentional dictation errors.    Alfred Levins, [place], MD 09/11/20 Laureen Abrahams

## 2020-09-12 ENCOUNTER — Emergency Department: Payer: Medicare Other

## 2020-09-12 ENCOUNTER — Other Ambulatory Visit: Payer: Self-pay

## 2020-09-12 ENCOUNTER — Emergency Department
Admission: EM | Admit: 2020-09-12 | Discharge: 2020-09-12 | Disposition: A | Discharge status: Home / Self Care | Payer: Medicare Other | Attending: Emergency Medicine | Admitting: Emergency Medicine

## 2020-09-12 ENCOUNTER — Encounter: Payer: Self-pay | Admitting: Emergency Medicine

## 2020-09-12 DIAGNOSIS — R059 Cough, unspecified: Secondary | ICD-10-CM | POA: Insufficient documentation

## 2020-09-12 DIAGNOSIS — Z20822 Contact with and (suspected) exposure to covid-19: Secondary | ICD-10-CM | POA: Diagnosis not present

## 2020-09-12 DIAGNOSIS — Z87891 Personal history of nicotine dependence: Secondary | ICD-10-CM | POA: Diagnosis not present

## 2020-09-12 DIAGNOSIS — Z8673 Personal history of transient ischemic attack (TIA), and cerebral infarction without residual deficits: Secondary | ICD-10-CM | POA: Diagnosis not present

## 2020-09-12 DIAGNOSIS — R0602 Shortness of breath: Secondary | ICD-10-CM | POA: Diagnosis present

## 2020-09-12 DIAGNOSIS — Z79899 Other long term (current) drug therapy: Secondary | ICD-10-CM | POA: Insufficient documentation

## 2020-09-12 DIAGNOSIS — Z86718 Personal history of other venous thrombosis and embolism: Secondary | ICD-10-CM | POA: Insufficient documentation

## 2020-09-12 DIAGNOSIS — I10 Essential (primary) hypertension: Secondary | ICD-10-CM | POA: Diagnosis not present

## 2020-09-12 LAB — COMPREHENSIVE METABOLIC PANEL
ALT: 16 U/L (ref 0–44)
AST: 23 U/L (ref 15–41)
Albumin: 3.5 g/dL (ref 3.5–5.0)
Alkaline Phosphatase: 51 U/L (ref 38–126)
Anion gap: 7 (ref 5–15)
BUN: 12 mg/dL (ref 6–20)
CO2: 26 mmol/L (ref 22–32)
Calcium: 9.1 mg/dL (ref 8.9–10.3)
Chloride: 107 mmol/L (ref 98–111)
Creatinine, Ser: 0.85 mg/dL (ref 0.44–1.00)
GFR, Estimated: >60 mL/min (ref >60)
Glucose, Bld: 85 mg/dL (ref 70–99)
Potassium: 3.5 mmol/L (ref 3.5–5.1)
Sodium: 140 mmol/L (ref 135–145)
Total Bilirubin: 0.5 mg/dL (ref 0.3–1.2)
Total Protein: 7.9 g/dL (ref 6.5–8.1)

## 2020-09-12 LAB — CBC WITH DIFFERENTIAL/PLATELET
Abs Immature Granulocytes: 0.03 10*3/uL (ref 0.00–0.07)
Basophils Absolute: 0 10*3/uL (ref 0.0–0.1)
Basophils Relative: 0 %
Eosinophils Absolute: 0.7 10*3/uL — ABNORMAL HIGH (ref 0.0–0.5)
Eosinophils Relative: 8 %
HCT: 35.9 % — ABNORMAL LOW (ref 36.0–46.0)
Hemoglobin: 11.8 g/dL — ABNORMAL LOW (ref 12.0–15.0)
Immature Granulocytes: 0 %
Lymphocytes Relative: 19 %
Lymphs Abs: 1.7 10*3/uL (ref 0.7–4.0)
MCH: 31.7 pg (ref 26.0–34.0)
MCHC: 32.9 g/dL (ref 30.0–36.0)
MCV: 96.5 fL (ref 80.0–100.0)
Monocytes Absolute: 0.8 10*3/uL (ref 0.1–1.0)
Monocytes Relative: 9 %
Neutro Abs: 5.7 10*3/uL (ref 1.7–7.7)
Neutrophils Relative %: 64 %
Platelets: 144 10*3/uL — ABNORMAL LOW (ref 150–400)
RBC: 3.72 MIL/uL — ABNORMAL LOW (ref 3.87–5.11)
RDW: 12.9 % (ref 11.5–15.5)
WBC: 8.9 10*3/uL (ref 4.0–10.5)
nRBC: 0 % (ref 0.0–0.2)

## 2020-09-12 LAB — BRAIN NATRIURETIC PEPTIDE: B Natriuretic Peptide: 36.2 pg/mL (ref 0.0–100.0)

## 2020-09-12 LAB — TROPONIN I (HIGH SENSITIVITY): Troponin I (High Sensitivity): 3 ng/L (ref <18)

## 2020-09-12 LAB — POC SARS CORONAVIRUS 2 AG -  ED: SARS Coronavirus 2 Ag: NEGATIVE

## 2020-09-12 MED ORDER — IOHEXOL 350 MG/ML SOLN
75.0000 mL | Freq: Once | INTRAVENOUS | Status: AC | PRN
  Administered 2020-09-12: 75 mL via INTRAVENOUS

## 2020-09-12 NOTE — ED Provider Notes (Signed)
[facility] Emergency Department Provider Note   ____________________________________________   Event Date/Time   First MD Initiated Contact with Patient 09/12/20 [phone]     (approximate)  I have reviewed the triage vital signs and the nursing notes.   HISTORY  Chief Complaint Shortness of Breath    HPI Valerie Bell is a 55 y.o. female presents for evaluation of feeling slightly short of breath today.  She reports she has had some shortness of breath about the last 24 hours with mild.  She is also just started on antibiotic for her right index finger which seems to be unchanged at this point from her last ED visit.  She does report as well that she did have the shortness of breath prior to coming to the ER last night, I did not start since starting clindamycin.  She denies leg swelling.  No chest pain.  No nausea or vomiting.  No fevers.  Slight cough.  Denies any other signs or symptoms at this point   Past Medical History:  Diagnosis Date  . Anxiety   . Collagen vascular disease ([place])   . DVT (deep venous thrombosis) ([place])   . Hypertension   . Lupus (systemic lupus erythematosus) ([place])   . Spinal cord lesion ([place])   . Stroke [facility])     Patient Active Problem List   Diagnosis Date Noted  . Pressure injury of skin 09/02/2018  . Acute encephalopathy 08/27/2018  . Encephalitis 08/27/2018  . Benign essential HTN 08/27/2018  . Leucocytosis 08/27/2018  . Lupus ([place]) 08/27/2018  . DVT (deep vein thrombosis) in pregnancy 08/27/2018  . Lethargy 08/24/2018  . Chest pain 03/30/2017    Past Surgical History:  Procedure Laterality Date  . ABDOMINAL HYSTERECTOMY    . BREAST BIOPSY Right 17+ years ago   negative Core Bx    Prior to Admission medications   Medication Sig Start Date End Date Taking? Authorizing Provider  atorvastatin (LIPITOR) 80 MG tablet Take 80 mg by mouth daily.    [provider]  clindamycin (CLEOCIN) 300 MG capsule  Take 1 capsule (300 mg total) by mouth 3 (three) times daily for 10 days. 09/11/20 09/21/20  Rudene Re, MD  heparin 25000-0.45 UT/250ML-% infusion Inject 700 Units/hr into the vein continuous. 08/25/18   Dustin Flock, MD  hydrocortisone 1 % ointment Apply 1 application topically 2 (two) times daily. 09/13/18   [provider]  hydroxychloroquine (PLAQUENIL) 200 MG tablet Take 200 mg by mouth daily.    [provider]  mirtazapine (REMERON) 7.5 MG tablet Take 7.5 mg by mouth at bedtime. 01/14/19 01/14/20  [provider]  oxyCODONE-acetaminophen (PERCOCET) 5-325 MG tablet Take 1 tablet by mouth every 8 (eight) hours as needed. 09/12/19   Earleen Newport, MD  polyethylene glycol powder (GAVILAX) 17 GM/SCOOP powder Take 17 g by mouth daily. 09/13/18   [provider]  rivaroxaban (XARELTO) 20 MG TABS tablet Take 1 tablet (20 mg total) by mouth daily with supper. 09/12/19 10/28/20  Earleen Newport, MD  senna-docusate (SENOKOT-S) 8.6-50 MG tablet Take 2 tablets by mouth Nightly. 09/13/18   [provider]    Allergies Ace inhibitors  Family History  Problem Relation Age of Onset  . Arthritis Mother   . Hypertension Mother   . Breast cancer Neg Hx     Social History Social History   Tobacco Use  . Smoking status: Former Smoker    Packs/day: 0.30    Years:  4.00    Pack years: 1.20    Types: Cigarettes    Quit date: 12/30/2003    Years since quitting: 16.7  . Smokeless tobacco: Never Used  Substance Use Topics  . Alcohol use: No    Alcohol/week: 0.0 standard drinks  . Drug use: Yes    Types: Marijuana    Comment: 2 days ago.     Review of Systems Constitutional: No fever/chills Eyes: No visual changes. ENT: No sore throat.  Some slight nasal congestion. Cardiovascular: Denies chest pain.  Compliant with her Xarelto and current medications. Respiratory: Denies shortness of breath. Gastrointestinal: No abdominal pain.    Genitourinary: Negative for dysuria. Musculoskeletal: Negative for back pain.  She utilizes a walker mostly and occasionally wheelchair.  Family helps her.  Reports her mobility has not changed from baseline.  Right index finger slightly swollen now starting on clindamycin for same Skin: Negative for rash. Neurological: Negative for headaches, areas of focal weakness or numbness.  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  Denies any new issues.    ____________________________________________   PHYSICAL EXAM:  VITAL SIGNS: ED Triage Vitals  Enc Vitals Group     BP 09/12/20 0905 (!) 169/116     Pulse Rate 09/12/20 0905 79     Resp 09/12/20 0905 (!) 24     Temp 09/12/20 0905 98 F (36.7 C)     Temp Source 09/12/20 0905 Oral     SpO2 09/12/20 0905 99 %     Weight 09/12/20 0906 130 lb 1.1 oz (59 kg)     Height 09/12/20 0906 5\' 3"  (1.6 m)     Head Circumference --      Peak Flow --      Pain Score 09/12/20 0906 0     Pain Loc --      Pain Edu? --      Excl. in [address]? --     Constitutional: Alert and oriented. Well appearing and in no acute distress.  She is very calm.  Fully alert.  In no distress. Eyes: Conjunctivae are normal.  I do not appreciate any rhinorrhea at this time. Head: Atraumatic. Nose: No congestion/rhinnorhea. Mouth/Throat: Mucous membranes are moist. Neck: No stridor.  Cardiovascular: Normal rate, regular rhythm. Grossly normal heart sounds.  Good peripheral circulation. Respiratory: Normal respiratory effort.  No retractions. Lungs CTAB. Gastrointestinal: Soft and nontender. No distention. Musculoskeletal: No lower extremity tenderness nor edema. Neurologic:  Normal speech and language. No gross focal neurologic deficits are appreciated.  Skin:  Skin is warm, dry and intact. No rash noted. Psychiatric: Mood and affect are normal. Speech and behavior are normal.  ____________________________________________   LABS (all labs ordered  are listed, but only abnormal results are displayed)  Labs Reviewed  CBC WITH DIFFERENTIAL/PLATELET - Abnormal; Notable for the following components:      Result Value   RBC 3.72 (*)    Hemoglobin 11.8 (*)    HCT 35.9 (*)    Platelets 144 (*)    Eosinophils Absolute 0.7 (*)    All other components within normal limits  CULTURE, BLOOD (SINGLE)  COMPREHENSIVE METABOLIC PANEL  BRAIN NATRIURETIC PEPTIDE  POC SARS CORONAVIRUS 2 AG -  ED  TROPONIN I (HIGH SENSITIVITY)   ____________________________________________  EKG  ED ECG REPORT I, Delman Kitten, the attending physician, personally viewed and interpreted this ECG.  Date: 09/12/2020 EKG Time: 910 Rate: 80 Rhythm: normal sinus rhythm QRS Axis: normal Intervals: normal ST/T Wave  abnormalities: normal Narrative Interpretation: no evidence of acute ischemia  ____________________________________________  RADIOLOGY  DG Chest 2 View  Result Date: 09/12/2020 CLINICAL DATA:  55 year old female with acute shortness of breath. Congestion. EXAM: CHEST - 2 VIEW COMPARISON:  09/01/2020 and 02/20/2019 radiographs FINDINGS: This is a mildly low volume study. The cardiomediastinal silhouette is unremarkable. Equivocal mild increased streaky/hazy opacities at the LEFT lung base noted. There is no evidence of focal airspace disease, pulmonary edema, suspicious pulmonary nodule/mass, pleural effusion, or pneumothorax. No acute bony abnormalities are identified. IMPRESSION: Equivocal mild LEFT basilar opacity/atelectasis. Pneumonia is a consideration. Electronically Signed   By: Margarette Canada M.D.   On: 09/12/2020 09:45   CT Angio Chest PE W and/or Wo Contrast  Result Date: 09/12/2020 CLINICAL DATA:  Shortness of breath beginning this morning, history lupus, stroke, hypertension, DVT EXAM: CT ANGIOGRAPHY CHEST WITH CONTRAST TECHNIQUE: Multidetector CT imaging of the chest was performed using the standard protocol during bolus administration of  intravenous contrast. Multiplanar CT image reconstructions and MIPs were obtained to evaluate the vascular anatomy. CONTRAST:  1mL OMNIPAQUE IOHEXOL 350 MG/ML SOLN IV COMPARISON:  03/30/2017 FINDINGS: Cardiovascular: Mild atherosclerotic calcifications aorta and coronary arteries. Aorta normal caliber without aneurysm or dissection. Cardiac chambers unremarkable. No pericardial effusion. Pulmonary arteries adequately opacified and patent. No evidence of pulmonary embolism. Main pulmonary artery is prominent at 3.5 cm diameter. Mediastinum/Nodes: Esophagus unremarkable. Base of cervical region normal appearance. LEFT axillary lymph nodes up to 11 mm diameter. No thoracic adenopathy. Lungs/Pleura: Dependent atelectasis in posterior lungs. Lungs otherwise clear. No acute infiltrate, pleural effusion, or pneumothorax. Upper Abdomen: Visualized upper abdomen unremarkable Musculoskeletal: No acute osseous findings. Review of the MIP images confirms the above findings. IMPRESSION: No evidence of pulmonary embolism. Mild dependent atelectasis in posterior lungs. Prominent main pulmonary artery 3.5 cm diameter, could reflect pulmonary arterial hypertension. No additional acute intrathoracic abnormalities. Aortic Atherosclerosis (ICD10-I70.0). Electronically Signed   By: Lavonia Dana M.D.   On: 09/12/2020 12:08    Imaging reviewed negative for acute PE.  Mild atelectasis.  In review of the patient's history labs afebrile status, I feel that this does not appear to indicate any sort of acute infection or pneumonia in the lung.  No evidence of PE.  Potential for pulmonary hypertension, recommended the patient follow-up closely with her primary physician on this finding.  Patient agreeable ____________________________________________   PROCEDURES  Procedure(s) performed: None  Procedures  Critical Care performed: No  ____________________________________________   INITIAL IMPRESSION / ASSESSMENT AND PLAN / ED  COURSE  Pertinent labs & imaging results that were available during my care of the patient were reviewed by me and considered in my medical decision making (see chart for details).   Patient is for evaluation of some mild shortness of breath that she has been experiencing for the last day.  Was seen at the ER earlier but did not mention the symptoms, now come back for further evaluation.  She does not appear acutely dyspneic very reassuring exam normal oxygen saturation.  On anticoagulation lowering risk for PE.  On chest x-ray does have questionable atelectasis versus infiltrate, but lacks leukocytosis or fever.  Slight dry cough but her symptoms did not seem convincingly of that of obvious respiratory infection.  Covid rapid test negative  EKG reassuring without evidence of ischemia.  Normal troponin, normal BNP.  Very reassuring work-up including CT scan.  Clinical Course as of 09/12/20 1507  Sun Sep 12, 2020  1309 Discussed and updated patient on plan  of care, her CT scan is reviewed I do not see evidence of pneumonia.  Possibly some pulmonary hypertension.  She is resting quite comfortably with normal oxygen saturation and respirations at this time.  I discussed with her, we will check a single troponin as well as a BNP and if these are normal anticipate discharge.  Denies chest pain. [MQ]  5597 Patient's elevated blood pressure is noted, but review of her previous clinic note from hematology would indicate that this is chronic in nature.  Does not appear to have an acute elevation.  Recommend patient follow-up closely with primary care physician continue treatment as recommended earlier today for right index finger infection.  Careful return precautions and treatment plan advised.  Patient understand agreeable with plan.  Resting comfortably without distress. [MQ]    Clinical Course User Index [MQ] Delman Kitten, MD    ----------------------------------------- 3:07 PM on  09/12/2020 -----------------------------------------  Patient resting comfortably.  Reviewed her labs CT scan and recommendation for primary follow-up.  Also discussed her blood pressure being high, reports she is on antihypertensive at home but has not taken it today because she has been at the hospital.  Discussed with her, she would preference to take her medication when she gets home as opposed to waiting further.  I think this is reasonable.  A friend and roommate is with her, she will be helping her get back home as well  Patient comfortable with careful return precautions and follow-up care.  She is awake alert hemodynamically stable but does have notable hypertension but again plan to take her antihypertensive at home which I think is reasonable.  Return precautions and treatment recommendations and follow-up discussed with the patient who is agreeable with the plan.  ____________________________________________   FINAL CLINICAL IMPRESSION(S) / ED DIAGNOSES  Final diagnoses:  Shortness of breath  Hypertension, unspecified type        Note:  This document was prepared using Dragon voice recognition software and may include unintentional dictation errors       Delman Kitten, MD 09/12/20 [phone]

## 2020-09-12 NOTE — ED Triage Notes (Signed)
Pt to ED via POV with c/o SOB that started today. Pt noted to be nose breathing at this time, some nasal congestion noted. Pt denies pain at this time. RA sats 99%.   Pt denies cough at this time. Pt answers questions with head shakes/nods/limited answers at this time.

## 2020-09-12 NOTE — ED Notes (Signed)
Pt incontinent of urine. Pt cleaned and provided with new brief, gown, and linens.

## 2020-09-17 LAB — CULTURE, BLOOD (SINGLE): Culture: NO GROWTH

## 2020-09-29 ENCOUNTER — Emergency Department
Admission: EM | Admit: 2020-09-29 | Discharge: 2020-09-29 | Disposition: A | Discharge status: Home / Self Care | Payer: Medicare Other | Attending: Emergency Medicine | Admitting: Emergency Medicine

## 2020-09-29 ENCOUNTER — Emergency Department: Payer: Medicare Other

## 2020-09-29 DIAGNOSIS — Z86718 Personal history of other venous thrombosis and embolism: Secondary | ICD-10-CM | POA: Diagnosis not present

## 2020-09-29 DIAGNOSIS — Z87891 Personal history of nicotine dependence: Secondary | ICD-10-CM | POA: Insufficient documentation

## 2020-09-29 DIAGNOSIS — I10 Essential (primary) hypertension: Secondary | ICD-10-CM | POA: Insufficient documentation

## 2020-09-29 DIAGNOSIS — Z8673 Personal history of transient ischemic attack (TIA), and cerebral infarction without residual deficits: Secondary | ICD-10-CM | POA: Diagnosis not present

## 2020-09-29 DIAGNOSIS — R4182 Altered mental status, unspecified: Secondary | ICD-10-CM | POA: Diagnosis present

## 2020-09-29 DIAGNOSIS — R404 Transient alteration of awareness: Secondary | ICD-10-CM

## 2020-09-29 DIAGNOSIS — Z79899 Other long term (current) drug therapy: Secondary | ICD-10-CM | POA: Insufficient documentation

## 2020-09-29 LAB — COMPREHENSIVE METABOLIC PANEL
ALT: 12 U/L (ref 0–44)
AST: 26 U/L (ref 15–41)
Albumin: 4 g/dL (ref 3.5–5.0)
Alkaline Phosphatase: 57 U/L (ref 38–126)
Anion gap: 10 (ref 5–15)
BUN: 16 mg/dL (ref 6–20)
CO2: 25 mmol/L (ref 22–32)
Calcium: 9.7 mg/dL (ref 8.9–10.3)
Chloride: 103 mmol/L (ref 98–111)
Creatinine, Ser: 1.07 mg/dL — ABNORMAL HIGH (ref 0.44–1.00)
GFR, Estimated: >60 mL/min (ref >60)
Glucose, Bld: 84 mg/dL (ref 70–99)
Potassium: 3.6 mmol/L (ref 3.5–5.1)
Sodium: 138 mmol/L (ref 135–145)
Total Bilirubin: 1.1 mg/dL (ref 0.3–1.2)
Total Protein: 8.8 g/dL — ABNORMAL HIGH (ref 6.5–8.1)

## 2020-09-29 LAB — CBC WITH DIFFERENTIAL/PLATELET
Abs Immature Granulocytes: 0.03 10*3/uL (ref 0.00–0.07)
Basophils Absolute: 0 10*3/uL (ref 0.0–0.1)
Basophils Relative: 0 %
Eosinophils Absolute: 0.1 10*3/uL (ref 0.0–0.5)
Eosinophils Relative: 1 %
HCT: 40.5 % (ref 36.0–46.0)
Hemoglobin: 13.2 g/dL (ref 12.0–15.0)
Immature Granulocytes: 0 %
Lymphocytes Relative: 6 %
Lymphs Abs: 0.7 10*3/uL (ref 0.7–4.0)
MCH: 31.1 pg (ref 26.0–34.0)
MCHC: 32.6 g/dL (ref 30.0–36.0)
MCV: 95.5 fL (ref 80.0–100.0)
Monocytes Absolute: 0.2 10*3/uL (ref 0.1–1.0)
Monocytes Relative: 2 %
Neutro Abs: 9.9 10*3/uL — ABNORMAL HIGH (ref 1.7–7.7)
Neutrophils Relative %: 91 %
Platelets: 134 10*3/uL — ABNORMAL LOW (ref 150–400)
RBC: 4.24 MIL/uL (ref 3.87–5.11)
RDW: 13.2 % (ref 11.5–15.5)
WBC: 10.9 10*3/uL — ABNORMAL HIGH (ref 4.0–10.5)
nRBC: 0 % (ref 0.0–0.2)

## 2020-09-29 LAB — URINE DRUG SCREEN, QUALITATIVE (ARMC ONLY)
Amphetamines, Ur Screen: NOT DETECTED
Barbiturates, Ur Screen: NOT DETECTED
Benzodiazepine, Ur Scrn: NOT DETECTED
Cannabinoid 50 Ng, Ur ~~LOC~~: POSITIVE — AB
Cocaine Metabolite,Ur ~~LOC~~: POSITIVE — AB
MDMA (Ecstasy)Ur Screen: NOT DETECTED
Methadone Scn, Ur: NOT DETECTED
Opiate, Ur Screen: NOT DETECTED
Phencyclidine (PCP) Ur S: NOT DETECTED
Tricyclic, Ur Screen: NOT DETECTED

## 2020-09-29 LAB — URINALYSIS, COMPLETE (UACMP) WITH MICROSCOPIC
Bacteria, UA: NONE SEEN
Bilirubin Urine: NEGATIVE
Glucose, UA: NEGATIVE mg/dL
Ketones, ur: NEGATIVE mg/dL
Leukocytes,Ua: NEGATIVE
Nitrite: NEGATIVE
Protein, ur: NEGATIVE mg/dL
Specific Gravity, Urine: 1.01 (ref 1.005–1.030)
pH: 9 — ABNORMAL HIGH (ref 5.0–8.0)

## 2020-09-29 LAB — TROPONIN I (HIGH SENSITIVITY): Troponin I (High Sensitivity): 5 ng/L (ref <18)

## 2020-09-29 LAB — LACTIC ACID, PLASMA
Lactic Acid, Venous: 2.2 mmol/L (ref 0.5–1.9)
Lactic Acid, Venous: 1.8 mmol/L (ref 0.5–1.9)

## 2020-09-29 MED ORDER — SODIUM CHLORIDE 0.9 % IV BOLUS
1000.0000 mL | Freq: Once | INTRAVENOUS | Status: AC
  Administered 2020-09-29: 1000 mL via INTRAVENOUS

## 2020-09-29 NOTE — ED Triage Notes (Signed)
Pt BIB EMS for altered mental status. Pt relative/roomate called because pt was acting "weird" this morning and is less talkative and awake than usual.  EMS noted foul odor coming from pt when transferring pt to stretcher.  Pt is drowsy in triage and only nodding or shaking her head for answers.

## 2020-09-29 NOTE — ED Notes (Signed)
Pt has been provided with discharge instructions. Pt denies any questions or concerns at this time. Pt verbalizes understanding for follow up care and d/c.  VSS.  Pt left department with all belongings.  

## 2020-09-29 NOTE — Discharge Instructions (Addendum)
Your exam, labs, and CT were normal at the time of discharge.  Your symptoms may be due to to the effects of illicit drugs.  You should consider stopping all illicit drug use at this time.  Follow with your primary provider return to the ED if needed.

## 2020-09-29 NOTE — ED Provider Notes (Signed)
[facility] Emergency Department Provider Note  ____________________________________________   Event Date/Time   First MD Initiated Contact with Patient 09/29/20 1235     (approximate)  I have reviewed the triage vital signs and the nursing notes.   HISTORY  Chief Complaint Altered Mental Status  HPI is limited as she is a poor historian, unaware of reason and slow speech.  But into the patient's room a who called EMS the patient was acting "weird".  Patient presents for evaluation of altered mental status.  HPI Valerie Bell is a 55 y.o. female presents to the ED via EMS from home.  Apparently the patient's roommate called out reporting that patient was acting "weird."  Patient has a history of hemorrhagic stroke and has some mild gait instability and dysarthria.  Patient's roommate is at bedside to provide further history.  She notes that the patient went outside as they were going to head to the store shortly.  When she went outside after a short amount of time, she noted that the patient appeared dazed after she called her name.  She denies any witnessed fall or the patient showing any signs of weakness above baseline.  The patient at this time denies any pain or other concerns.  She will be evaluated based on her presentation of altered mental status.      Past Medical History:  Diagnosis Date  . Anxiety   . Collagen vascular disease ([place])   . DVT (deep venous thrombosis) ([place])   . Hypertension   . Lupus (systemic lupus erythematosus) ([place])   . Spinal cord lesion ([place])   . Stroke [facility])     Patient Active Problem List   Diagnosis Date Noted  . Pressure injury of skin 09/02/2018  . Acute encephalopathy 08/27/2018  . Encephalitis 08/27/2018  . Benign essential HTN 08/27/2018  . Leucocytosis 08/27/2018  . Lupus ([place]) 08/27/2018  . DVT (deep vein thrombosis) in pregnancy 08/27/2018  . Lethargy 08/24/2018  . Chest pain 03/30/2017    Past  Surgical History:  Procedure Laterality Date  . ABDOMINAL HYSTERECTOMY    . BREAST BIOPSY Right 17+ years ago   negative Core Bx    Prior to Admission medications   Medication Sig Start Date End Date Taking? Authorizing Provider  atorvastatin (LIPITOR) 80 MG tablet Take 80 mg by mouth daily.    [provider]  heparin 25000-0.45 UT/250ML-% infusion Inject 700 Units/hr into the vein continuous. 08/25/18   Dustin Flock, MD  hydrocortisone 1 % ointment Apply 1 application topically 2 (two) times daily. 09/13/18   [provider]  hydroxychloroquine (PLAQUENIL) 200 MG tablet Take 200 mg by mouth daily.    [provider]  mirtazapine (REMERON) 7.5 MG tablet Take 7.5 mg by mouth at bedtime. 01/14/19 01/14/20  [provider]  oxyCODONE-acetaminophen (PERCOCET) 5-325 MG tablet Take 1 tablet by mouth every 8 (eight) hours as needed. 09/12/19   Earleen Newport, MD  polyethylene glycol powder (GAVILAX) 17 GM/SCOOP powder Take 17 g by mouth daily. 09/13/18   [provider]  rivaroxaban (XARELTO) 20 MG TABS tablet Take 1 tablet (20 mg total) by mouth daily with supper. 09/12/19 10/28/20  Earleen Newport, MD  senna-docusate (SENOKOT-S) 8.6-50 MG tablet Take 2 tablets by mouth Nightly. 09/13/18   [provider]    Allergies Ace inhibitors  Family History  Problem Relation Age of Onset  . Arthritis Mother   . Hypertension Mother   . Breast  cancer Neg Hx     Social History Social History   Tobacco Use  . Smoking status: Former Smoker    Packs/day: 0.30    Years: 4.00    Pack years: 1.20    Types: Cigarettes    Quit date: 12/30/2003    Years since quitting: 16.7  . Smokeless tobacco: Never Used  Substance Use Topics  . Alcohol use: No    Alcohol/week: 0.0 standard drinks  . Drug use: Yes    Types: Marijuana    Comment: 2 days ago.     Review of Systems Constitutional: No fever/chills Eyes: No visual changes. ENT: No sore  throat. Cardiovascular: Denies chest pain. Respiratory: Denies shortness of breath. Gastrointestinal: No abdominal pain.  No nausea, no vomiting.  No diarrhea.  No constipation. Genitourinary: Negative for dysuria. Musculoskeletal: Negative for back pain. Skin: Negative for rash. Neurological: Negative for headaches, focal weakness or numbness. ____________________________________________   PHYSICAL EXAM:  VITAL SIGNS: ED Triage Vitals  Enc Vitals Group     BP --      Pulse --      Resp --      Temp --      Temp src --      SpO2 09/29/20 1237 99 %     Weight 09/29/20 1238 145 lb (65.8 kg)     Height 09/29/20 1238 5\' 4"  (1.626 m)     Head Circumference --      Peak Flow --      Pain Score 09/29/20 1238 0     Pain Loc --      Pain Edu? --      Excl. in [place]? --    Constitutional: Alert and oriented. Well appearing and in no acute distress. Eyes: Conjunctivae are normal. PERRL. EOMI. Head: Atraumatic. Nose: No congestion/rhinnorhea. Mouth/Throat: Mucous membranes are moist.  Oropharynx non-erythematous. Neck: No stridor.   Cardiovascular: Normal rate, regular rhythm. Grossly normal heart sounds.  Good peripheral circulation. Respiratory: Normal respiratory effort.  No retractions. Lungs CTAB. Gastrointestinal: Soft and nontender. No distention. No abdominal bruits. No CVA tenderness. Musculoskeletal: No lower extremity tenderness nor edema.  No joint effusions.  Active range of motion of the upper and lower extremities on exam. Neurologic:  Dysarthric speech and language at baseline.  No gross focal neurologic deficits are appreciated.  Skin:  Skin is warm, dry and intact. No rash noted. Psychiatric: Mood and affect are normal. Speech and behavior are normal.  ____________________________________________   LABS (all labs ordered are listed, but only abnormal results are displayed)  Labs Reviewed  URINALYSIS, COMPLETE (UACMP) WITH MICROSCOPIC - Abnormal; Notable for the  following components:      Result Value   Color, Urine STRAW (*)    APPearance CLEAR (*)    pH 9.0 (*)    Hgb urine dipstick SMALL (*)    All other components within normal limits  LACTIC ACID, PLASMA - Abnormal; Notable for the following components:   Lactic Acid, Venous 2.2 (*)    All other components within normal limits  COMPREHENSIVE METABOLIC PANEL - Abnormal; Notable for the following components:   Creatinine, Ser 1.07 (*)    Total Protein 8.8 (*)    All other components within normal limits  CBC WITH DIFFERENTIAL/PLATELET - Abnormal; Notable for the following components:   WBC 10.9 (*)    Platelets 134 (*)    Neutro Abs 9.9 (*)    All other components within normal limits  URINE DRUG  SCREEN, QUALITATIVE ([facility] ONLY) - Abnormal; Notable for the following components:   Cocaine Metabolite,Ur [place] POSITIVE (*)    Cannabinoid 50 Ng, Ur [place] POSITIVE (*)    All other components within normal limits  LACTIC ACID, PLASMA  CBC WITH DIFFERENTIAL/PLATELET  TROPONIN I (HIGH SENSITIVITY)  ____________________________________________  EKG  ____________________________________________  RADIOLOGY I, Melvenia Needles, personally viewed and evaluated these images (plain radiographs) as part of my medical decision making, as well as reviewing the written report by the radiologist.  ED MD interpretation:  Agree with report  Official radiology report(s): No results found.  CT Head w/o CM  IMPRESSION: No acute intracranial abnormality seen.  ____________________________________________   PROCEDURES  Procedure(s) performed (including Critical Care):  Procedures   ____________________________________________   INITIAL IMPRESSION / ASSESSMENT AND PLAN / ED COURSE  As part of my medical decision making, I reviewed the following data within the electronic MEDICAL RECORD NUMBER History obtained from family, Labs reviewed mild elevated lactic acid, + UDS, Notes from prior ED visits  and [place] Controlled Substance Database   Differential diagnosis includes, but is not limited to, alcohol, illicit or prescription medications, or other toxic ingestion; intracranial pathology such as stroke or intracerebral hemorrhage; fever or infectious causes including sepsis; hypoxemia and/or hypercarbia; uremia; trauma; endocrine related disorders such as diabetes, hypoglycemia, and thyroid-related diseases; hypertensive encephalopathy; etc.  Patient ED evaluation of reported altered mental status just prior to arrival patient arrives by EMS and is a poor historian initially.  History is provided by the patient's cousin who is her roommate, who made the called EMS.  Patient was found on exam to be stable and was more conversant at the time of her evaluation.  She became more and more alert and active throughout her course in the ED.  Her CT scan was negative and reassuring at time of evaluation.  Lactic acid was mildly elevated the patient was given a fluid bolus.  She was found subsequently to have a positive urine drug screen.  Otherwise no abnormal findings were appreciated.  Patient is discharged at this time in stable condition and is at baseline according to her roommate.  Return precautions have been discussed. ____________________________________________   FINAL CLINICAL IMPRESSION(S) / ED DIAGNOSES  Final diagnoses:  Transient alteration of awareness     ED Discharge Orders    None      *Please note:  Valerie Bell was evaluated in Emergency Department on 09/30/2020 for the symptoms described in the history of present illness. She was evaluated in the context of the global COVID-19 pandemic, which necessitated consideration that the patient might be at risk for infection with the SARS-CoV-2 virus that causes COVID-19. Institutional protocols and algorithms that pertain to the evaluation of patients at risk for COVID-19 are in a state of rapid change based on information released by  regulatory bodies including the CDC and federal and state organizations. These policies and algorithms were followed during the patient's care in the ED.  Some ED evaluations and interventions may be delayed as a result of limited staffing during and the pandemic.*   Note:  This document was prepared using Dragon voice recognition software and may include unintentional dictation errors.    Melvenia Needles, PA-C 09/30/20 2348    Vanessa [place], MD 10/07/20 [phone]

## 2020-11-01 ENCOUNTER — Emergency Department
Admission: EM | Admit: 2020-11-01 | Discharge: 2020-11-01 | Disposition: A | Discharge status: Home / Self Care | Payer: Medicare Other | Attending: Emergency Medicine | Admitting: Emergency Medicine

## 2020-11-01 ENCOUNTER — Other Ambulatory Visit: Payer: Self-pay

## 2020-11-01 ENCOUNTER — Emergency Department: Payer: Medicare Other

## 2020-11-01 ENCOUNTER — Encounter: Payer: Self-pay | Admitting: Emergency Medicine

## 2020-11-01 DIAGNOSIS — R600 Localized edema: Secondary | ICD-10-CM | POA: Diagnosis not present

## 2020-11-01 DIAGNOSIS — Z86718 Personal history of other venous thrombosis and embolism: Secondary | ICD-10-CM | POA: Insufficient documentation

## 2020-11-01 DIAGNOSIS — I10 Essential (primary) hypertension: Secondary | ICD-10-CM | POA: Diagnosis not present

## 2020-11-01 DIAGNOSIS — Z87891 Personal history of nicotine dependence: Secondary | ICD-10-CM | POA: Diagnosis not present

## 2020-11-01 DIAGNOSIS — Z7901 Long term (current) use of anticoagulants: Secondary | ICD-10-CM | POA: Insufficient documentation

## 2020-11-01 DIAGNOSIS — R2241 Localized swelling, mass and lump, right lower limb: Secondary | ICD-10-CM | POA: Diagnosis present

## 2020-11-01 MED ORDER — HYDROMORPHONE HCL 1 MG/ML IJ SOLN
0.5000 mg | Freq: Once | INTRAMUSCULAR | Status: AC
  Administered 2020-11-01: 0.5 mg via INTRAMUSCULAR
  Filled 2020-11-01: qty 1

## 2020-11-01 NOTE — ED Notes (Signed)
See triage note  Presents with swelling and pain to right lower ext since yesterday  Denies any injury

## 2020-11-01 NOTE — ED Provider Notes (Signed)
[facility] Emergency Department Provider Note   ____________________________________________   Event Date/Time   First MD Initiated Contact with Patient 11/01/20 1004     (approximate)  I have reviewed the triage vital signs and the nursing notes.   HISTORY  Chief Complaint Leg Swelling    HPI Valerie Bell is a 55 y.o. female patient presents for right leg swelling.  Patient has history of bilateral DVTs.  Patient currently on blood thinners.  Patient denies dyspnea or chest pain.  Patient states there is mild right calf pain.         Past Medical History:  Diagnosis Date  . Anxiety   . Collagen vascular disease ([place])   . DVT (deep venous thrombosis) ([place])   . Hypertension   . Lupus (systemic lupus erythematosus) ([place])   . Spinal cord lesion ([place])   . Stroke [facility])     Patient Active Problem List   Diagnosis Date Noted  . Pressure injury of skin 09/02/2018  . Acute encephalopathy 08/27/2018  . Encephalitis 08/27/2018  . Benign essential HTN 08/27/2018  . Leucocytosis 08/27/2018  . Lupus ([place]) 08/27/2018  . DVT (deep vein thrombosis) in pregnancy 08/27/2018  . Lethargy 08/24/2018  . Chest pain 03/30/2017    Past Surgical History:  Procedure Laterality Date  . ABDOMINAL HYSTERECTOMY    . BREAST BIOPSY Right 17+ years ago   negative Core Bx    Prior to Admission medications   Medication Sig Start Date End Date Taking? Authorizing Provider  atorvastatin (LIPITOR) 80 MG tablet Take 80 mg by mouth daily.    [provider]  heparin 25000-0.45 UT/250ML-% infusion Inject 700 Units/hr into the vein continuous. 08/25/18   Dustin Flock, MD  hydrocortisone 1 % ointment Apply 1 application topically 2 (two) times daily. 09/13/18   [provider]  hydroxychloroquine (PLAQUENIL) 200 MG tablet Take 200 mg by mouth daily.    [provider]  mirtazapine (REMERON) 7.5 MG tablet Take 7.5 mg by mouth at bedtime.  01/14/19 01/14/20  [provider]  polyethylene glycol powder (GAVILAX) 17 GM/SCOOP powder Take 17 g by mouth daily. 09/13/18   [provider]  rivaroxaban (XARELTO) 20 MG TABS tablet Take 1 tablet (20 mg total) by mouth daily with supper. 09/12/19 10/28/20  Earleen Newport, MD  senna-docusate (SENOKOT-S) 8.6-50 MG tablet Take 2 tablets by mouth Nightly. 09/13/18   [provider]    Allergies Ace inhibitors  Family History  Problem Relation Age of Onset  . Arthritis Mother   . Hypertension Mother   . Breast cancer Neg Hx     Social History Social History   Tobacco Use  . Smoking status: Former Smoker    Packs/day: 0.30    Years: 4.00    Pack years: 1.20    Types: Cigarettes    Quit date: 12/30/2003    Years since quitting: 16.8  . Smokeless tobacco: Never Used  Substance Use Topics  . Alcohol use: No    Alcohol/week: 0.0 standard drinks  . Drug use: Yes    Types: Marijuana    Comment: 2 days ago.     Review of Systems Constitutional: No fever/chills Eyes: No visual changes. ENT: No sore throat. Cardiovascular: Denies chest pain. Respiratory: Denies shortness of breath. Gastrointestinal: No abdominal pain.  No nausea, no vomiting.  No diarrhea.  No constipation. Genitourinary: Negative for dysuria. Musculoskeletal: Negative for back pain. Skin: Negative for rash. Neurological: Negative for headaches,  focal weakness or numbness. Psychiatric:  Anxiety and depression.  EtOH abuse. Endocrine:  Hypertension and lupus.  :  Allergic/Immunilogical: ACE inhibitors.  ____________________________________________   PHYSICAL EXAM:  VITAL SIGNS: ED Triage Vitals  Enc Vitals Group     BP 11/01/20 0941 (!) 149/93     Pulse Rate 11/01/20 0941 64     Resp 11/01/20 0941 18     Temp 11/01/20 0941 98.3 F (36.8 C)     Temp Source 11/01/20 0941 Oral     SpO2 11/01/20 0941 100 %     Weight 11/01/20 0932 145 lb 1 oz (65.8 kg)     Height 11/01/20  0932 5\' 4"  (1.626 m)     Head Circumference --      Peak Flow --      Pain Score 11/01/20 0932 0     Pain Loc --      Pain Edu? --      Excl. in [place]? --    Constitutional: Alert and oriented. Well appearing and in no acute distress. Hematological/Lymphatic/Immunilogical: No cervical lymphadenopathy. Cardiovascular: Normal rate, regular rhythm. Grossly normal heart sounds.  Good peripheral circulation. Respiratory: Normal respiratory effort.  No retractions. Lungs CTAB. Gastrointestinal: Soft and nontender. No distention. No abdominal bruits. No CVA tenderness. Genitourinary: Deferred Musculoskeletal: Mild right calf edema.  No erythema.  Moderate guarding palpation.  Neurologic:  Normal speech and language. No gross focal neurologic deficits are appreciated. No gait instability. Skin:  Skin is warm, dry and intact. No rash noted.  No abrasion, ecchymosis, or erythema. Psychiatric: Mood and affect are normal. Speech and behavior are normal.  ____________________________________________   LABS (all labs ordered are listed, but only abnormal results are displayed)  Labs Reviewed - No data to display ____________________________________________  EKG   ____________________________________________  RADIOLOGY I, Sable Feil, personally viewed and evaluated these images (plain radiographs) as part of my medical decision making, as well as reviewing the written report by the radiologist.  ED MD interpretation:    Official radiology report(s): US Venous Img Lower Unilateral Right  Result Date: 11/01/2020 CLINICAL DATA:  Right lower extremity pain and edema for the past 2 days. History of previous DVT, currently on anticoagulation. Evaluate for acute or chronic DVT. EXAM: RIGHT LOWER EXTREMITY VENOUS DOPPLER ULTRASOUND TECHNIQUE: Gray-scale sonography with graded compression, as well as color Doppler and duplex ultrasound were performed to evaluate the lower extremity deep venous  systems from the level of the common femoral vein and including the common femoral, femoral, profunda femoral, popliteal and calf veins including the posterior tibial, peroneal and gastrocnemius veins when visible. The superficial great saphenous vein was also interrogated. Spectral Doppler was utilized to evaluate flow at rest and with distal augmentation maneuvers in the common femoral, femoral and popliteal veins. COMPARISON:  Right lower extremity venous Doppler ultrasound-09/06/2018 (positive for extensive predominantly occlusive DVT extending from the right common femoral vein through the right tibial veins, similar to the 2019 examination) FINDINGS: Contralateral Common Femoral Vein: There is hypoechoic nonocclusive wall thickening/DVT involving the left common femoral vein, new compared to the 08/2018 examination. There is grossly unchanged mixed echogenic nonocclusive DVT involving the right common femoral vein (image 11 and 12) extending to involve the saphenofemoral junction (image 17). The right deep femoral vein appears patent where imaged (image 21). There is grossly unchanged mixed echogenic near occlusive DVT involving the proximal (image 25), mid (image 30) and distal (image 35) aspects of the right femoral vein image. A duplicated  segment of the right mid femoral vein appears patent (image 32). There is unchanged hypoechoic nonocclusive thrombus involving the right popliteal vein (image 43), extending to involve the right posterior tibial (image 47 and peroneal veins (image 54), unchanged. Other Findings: There is a minimal amount of subcutaneous edema at the level of the right calf (image 45) IMPRESSION: 1. Age-indeterminate though presumably chronic nonocclusive wall thickening/DVT involving the contralateral LEFT common femoral vein, new compared to the 08/2018 examination and while favored to be chronic in etiology, an acute on chronic process is not excluded. Clinical correlation is advised.  2. Similar appearing near occlusive DVT involving near the entirety of the RIGHT lower extremity venous system extending from the right common femoral vein through the imaged tibial veins, similar to the 2019 examination. Electronically Signed   By: Sandi Mariscal M.D.   On: 11/01/2020 10:37    ____________________________________________   PROCEDURES  Procedure(s) performed (including Critical Care):  Procedures   ____________________________________________   INITIAL IMPRESSION / ASSESSMENT AND PLAN / ED COURSE  As part of my medical decision making, I reviewed the following data within the [place]         Patient presents with left calf pain and a concern for new DVT.  Discussed no new acute findings on Doppler ultrasound today.  Patient given discharge care instruction advised continue previous medication follow-up PCP.      ____________________________________________   FINAL CLINICAL IMPRESSION(S) / ED DIAGNOSES  Final diagnoses:  Leg edema     ED Discharge Orders    None      *Please note:  Valerie Bell was evaluated in Emergency Department on 11/01/2020 for the symptoms described in the history of present illness. She was evaluated in the context of the global COVID-19 pandemic, which necessitated consideration that the patient might be at risk for infection with the SARS-CoV-2 virus that causes COVID-19. Institutional protocols and algorithms that pertain to the evaluation of patients at risk for COVID-19 are in a state of rapid change based on information released by regulatory bodies including the CDC and federal and state organizations. These policies and algorithms were followed during the patient's care in the ED.  Some ED evaluations and interventions may be delayed as a result of limited staffing during and the pandemic.*   Note:  This document was prepared using Dragon voice recognition software and may include unintentional dictation  errors.    Sable Feil, PA-C 11/01/20 1103    Arta Silence, MD 11/01/20 1225

## 2020-11-01 NOTE — ED Triage Notes (Signed)
C/O right lower leg swelling x 1day.

## 2020-11-01 NOTE — Discharge Instructions (Addendum)
No acute findings on ultrasound of l right lower extremity.  Continue previous medications and follow-up with PCP.

## 2020-11-07 ENCOUNTER — Other Ambulatory Visit: Payer: Self-pay

## 2020-11-07 ENCOUNTER — Emergency Department: Payer: Medicare Other

## 2020-11-07 ENCOUNTER — Emergency Department
Admission: EM | Admit: 2020-11-07 | Discharge: 2020-11-08 | Disposition: A | Discharge status: Home / Self Care | Payer: Medicare Other | Attending: Emergency Medicine | Admitting: Emergency Medicine

## 2020-11-07 DIAGNOSIS — Z7901 Long term (current) use of anticoagulants: Secondary | ICD-10-CM | POA: Insufficient documentation

## 2020-11-07 DIAGNOSIS — Z79899 Other long term (current) drug therapy: Secondary | ICD-10-CM | POA: Insufficient documentation

## 2020-11-07 DIAGNOSIS — Z86711 Personal history of pulmonary embolism: Secondary | ICD-10-CM | POA: Diagnosis not present

## 2020-11-07 DIAGNOSIS — I10 Essential (primary) hypertension: Secondary | ICD-10-CM | POA: Diagnosis not present

## 2020-11-07 DIAGNOSIS — Z87891 Personal history of nicotine dependence: Secondary | ICD-10-CM | POA: Insufficient documentation

## 2020-11-07 DIAGNOSIS — L03115 Cellulitis of right lower limb: Secondary | ICD-10-CM | POA: Insufficient documentation

## 2020-11-07 DIAGNOSIS — R2241 Localized swelling, mass and lump, right lower limb: Secondary | ICD-10-CM | POA: Diagnosis present

## 2020-11-07 DIAGNOSIS — R0602 Shortness of breath: Secondary | ICD-10-CM | POA: Diagnosis not present

## 2020-11-07 LAB — CBC WITH DIFFERENTIAL/PLATELET
Abs Immature Granulocytes: 0.07 10*3/uL (ref 0.00–0.07)
Basophils Absolute: 0 10*3/uL (ref 0.0–0.1)
Basophils Relative: 0 %
Eosinophils Absolute: 0 10*3/uL (ref 0.0–0.5)
Eosinophils Relative: 0 %
HCT: 35.6 % — ABNORMAL LOW (ref 36.0–46.0)
Hemoglobin: 11.3 g/dL — ABNORMAL LOW (ref 12.0–15.0)
Immature Granulocytes: 1 %
Lymphocytes Relative: 12 %
Lymphs Abs: 1.3 10*3/uL (ref 0.7–4.0)
MCH: 31.1 pg (ref 26.0–34.0)
MCHC: 31.7 g/dL (ref 30.0–36.0)
MCV: 98.1 fL (ref 80.0–100.0)
Monocytes Absolute: 0.1 10*3/uL (ref 0.1–1.0)
Monocytes Relative: 1 %
Neutro Abs: 9.1 10*3/uL — ABNORMAL HIGH (ref 1.7–7.7)
Neutrophils Relative %: 86 %
Platelets: 116 10*3/uL — ABNORMAL LOW (ref 150–400)
RBC: 3.63 MIL/uL — ABNORMAL LOW (ref 3.87–5.11)
RDW: 13.2 % (ref 11.5–15.5)
WBC: 10.6 10*3/uL — ABNORMAL HIGH (ref 4.0–10.5)
nRBC: 0 % (ref 0.0–0.2)

## 2020-11-07 LAB — COMPREHENSIVE METABOLIC PANEL
ALT: 15 U/L (ref 0–44)
AST: 27 U/L (ref 15–41)
Albumin: 3.5 g/dL (ref 3.5–5.0)
Alkaline Phosphatase: 61 U/L (ref 38–126)
Anion gap: 10 (ref 5–15)
BUN: 16 mg/dL (ref 6–20)
CO2: 23 mmol/L (ref 22–32)
Calcium: 9.1 mg/dL (ref 8.9–10.3)
Chloride: 106 mmol/L (ref 98–111)
Creatinine, Ser: 0.97 mg/dL (ref 0.44–1.00)
GFR, Estimated: >60 mL/min (ref >60)
Glucose, Bld: 145 mg/dL — ABNORMAL HIGH (ref 70–99)
Potassium: 4.3 mmol/L (ref 3.5–5.1)
Sodium: 139 mmol/L (ref 135–145)
Total Bilirubin: 0.5 mg/dL (ref 0.3–1.2)
Total Protein: 8.1 g/dL (ref 6.5–8.1)

## 2020-11-07 LAB — BRAIN NATRIURETIC PEPTIDE: B Natriuretic Peptide: 90.1 pg/mL (ref 0.0–100.0)

## 2020-11-07 LAB — TROPONIN I (HIGH SENSITIVITY): Troponin I (High Sensitivity): 5 ng/L (ref <18)

## 2020-11-07 MED ORDER — CEPHALEXIN 500 MG PO CAPS: 500.0000 mg | ORAL_CAPSULE | Freq: Two times a day (BID) | ORAL | 0 refills | Status: DC

## 2020-11-07 MED ORDER — SODIUM CHLORIDE 0.9 % IV SOLN
1.0000 g | Freq: Once | INTRAVENOUS | Status: AC
  Administered 2020-11-07: 1 g via INTRAVENOUS
  Filled 2020-11-07: qty 10

## 2020-11-07 NOTE — ED Provider Notes (Signed)
[facility] Emergency Department Provider Note  ____________________________________________   Event Date/Time   First MD Initiated Contact with Patient 11/07/20 2026     (approximate)  I have reviewed the triage vital signs and the nursing notes.   HISTORY  Chief Complaint Leg Swelling    HPI LIZVET CHUNN is a 55 y.o. female patient has a history of CVA and is a poor historian but on a review of systems she has a history of DVTs on Xarelto who is coming in for worsening right leg swelling.  Patient was seen here on 5/16 and had ultrasound done that showed similar old thromboses.  On examination there on review of records it just comments on some mild calf edema.  However patient comes in for increasing swelling of that right leg.  Perhaps some mild shortness of breath as well.  Given patient's prior CVA the history is limited secondary to her ability to communicate       Past Medical History:  Diagnosis Date  . Anxiety   . Collagen vascular disease ([place])   . DVT (deep venous thrombosis) ([place])   . Hypertension   . Lupus (systemic lupus erythematosus) ([place])   . Spinal cord lesion ([place])   . Stroke [facility])     Patient Active Problem List   Diagnosis Date Noted  . Pressure injury of skin 09/02/2018  . Acute encephalopathy 08/27/2018  . Encephalitis 08/27/2018  . Benign essential HTN 08/27/2018  . Leucocytosis 08/27/2018  . Lupus ([place]) 08/27/2018  . DVT (deep vein thrombosis) in pregnancy 08/27/2018  . Lethargy 08/24/2018  . Chest pain 03/30/2017    Past Surgical History:  Procedure Laterality Date  . ABDOMINAL HYSTERECTOMY    . BREAST BIOPSY Right 17+ years ago   negative Core Bx    Prior to Admission medications   Medication Sig Start Date End Date Taking? Authorizing Provider  atorvastatin (LIPITOR) 80 MG tablet Take 80 mg by mouth daily.    [provider]  heparin 25000-0.45 UT/250ML-% infusion Inject 700 Units/hr into  the vein continuous. 08/25/18   Dustin Flock, MD  hydrocortisone 1 % ointment Apply 1 application topically 2 (two) times daily. 09/13/18   [provider]  hydroxychloroquine (PLAQUENIL) 200 MG tablet Take 200 mg by mouth daily.    [provider]  mirtazapine (REMERON) 7.5 MG tablet Take 7.5 mg by mouth at bedtime. 01/14/19 01/14/20  [provider]  polyethylene glycol powder (GAVILAX) 17 GM/SCOOP powder Take 17 g by mouth daily. 09/13/18   [provider]  rivaroxaban (XARELTO) 20 MG TABS tablet Take 1 tablet (20 mg total) by mouth daily with supper. 09/12/19 10/28/20  Earleen Newport, MD  senna-docusate (SENOKOT-S) 8.6-50 MG tablet Take 2 tablets by mouth Nightly. 09/13/18   [provider]    Allergies Ace inhibitors  Family History  Problem Relation Age of Onset  . Arthritis Mother   . Hypertension Mother   . Breast cancer Neg Hx     Social History Social History   Tobacco Use  . Smoking status: Former Smoker    Packs/day: 0.30    Years: 4.00    Pack years: 1.20    Types: Cigarettes    Quit date: 12/30/2003    Years since quitting: 16.8  . Smokeless tobacco: Never Used  Substance Use Topics  . Alcohol use: No    Alcohol/week: 0.0 standard drinks  . Drug use: Yes    Types: Marijuana  Comment: 2 days ago.       Review of Systems Unable to get full review of systems due to baseline stroke ________________________   PHYSICAL EXAM:  VITAL SIGNS: ED Triage Vitals  Enc Vitals Group     BP 11/07/20 2005 (!) 161/99     Pulse Rate 11/07/20 2005 71     Resp 11/07/20 2005 16     Temp 11/07/20 2005 98.5 F (36.9 C)     Temp Source 11/07/20 2005 Oral     SpO2 11/07/20 2005 99 %     Weight 11/07/20 2006 145 lb 8.1 oz (66 kg)     Height 11/07/20 2006 5\' 4"  (1.626 m)     Head Circumference --      Peak Flow --      Pain Score 11/07/20 2006 0     Pain Loc --      Pain Edu? --      Excl. in [place]? --     Constitutional:  Alert and oriented. Well appearing and in no acute distress. Eyes: Conjunctivae are normal. EOMI. Head: Atraumatic. Nose: No congestion/rhinnorhea. Mouth/Throat: Mucous membranes are moist.   Neck: No stridor. Trachea Midline. FROM Cardiovascular: Normal rate, regular rhythm. Grossly normal heart sounds.  Good peripheral circulation. Respiratory: Normal respiratory effort.  No retractions. Lungs CTAB. Gastrointestinal: Soft and nontender. No distention. No abdominal bruits.  Musculoskeletal: Swelling noted to the right leg increased over the left leg.  There is a little bit of patchy redness noted to the tib-fib with some warmth noted. No crepitus. No bruising or tenderness noted.  2+ distal pulse.  Able to lift both legs up off the bed about 1 inch.  Range of motion intact. Neurologic:  Speech altered due to prior stroke.  No gross focal neurologic deficits are appreciated.  Skin:  Skin is warm, dry and intact. No rash noted. Psychiatric: Mood and affect are normal. Speech and behavior are normal. GU: Deferred   ____________________________________________   LABS (all labs ordered are listed, but only abnormal results are displayed)  Labs Reviewed  CBC WITH DIFFERENTIAL/PLATELET - Abnormal; Notable for the following components:      Result Value   WBC 10.6 (*)    RBC 3.63 (*)    Hemoglobin 11.3 (*)    HCT 35.6 (*)    Platelets 116 (*)    Neutro Abs 9.1 (*)    All other components within normal limits  COMPREHENSIVE METABOLIC PANEL - Abnormal; Notable for the following components:   Glucose, Bld 145 (*)    All other components within normal limits  BRAIN NATRIURETIC PEPTIDE  URINALYSIS, COMPLETE (UACMP) WITH MICROSCOPIC   ____________________________________________   ED ECG REPORT I, Vanessa Bolivar, the attending physician, personally viewed and interpreted this ECG.  EKG is sinus rate of 60, no ST elevation, T wave version lead III, normal  intervals  ____________________________________________  RADIOLOGY Robert Bellow, personally viewed and evaluated these images (plain radiographs) as part of my medical decision making, as well as reviewing the written report by the radiologist.  ED MD interpretation: X-rays are negative  Official radiology report(s): DG Chest 2 View  Result Date: 11/07/2020 CLINICAL DATA:  Shortness of breath EXAM: CHEST - 2 VIEW COMPARISON:  None. FINDINGS: The heart size and mediastinal contours are within normal limits. Both lungs are clear. The visualized skeletal structures are unremarkable. IMPRESSION: No active cardiopulmonary disease. Electronically Signed   By: Ulyses Jarred M.D.   On: 11/07/2020  22:02   DG Tibia/Fibula Left  Result Date: 11/07/2020 CLINICAL DATA:  Leg swelling EXAM: LEFT TIBIA AND FIBULA - 2 VIEW COMPARISON:  None. FINDINGS: There is no evidence of fracture or other focal bone lesions. Soft tissues are unremarkable. IMPRESSION: Negative. Electronically Signed   By: Ulyses Jarred M.D.   On: 11/07/2020 22:03   DG Ankle Complete Left  Result Date: 11/07/2020 CLINICAL DATA:  Shortness of breath and leg swelling EXAM: LEFT ANKLE COMPLETE - 3+ VIEW COMPARISON:  None. FINDINGS: There is no evidence of fracture, dislocation, or joint effusion. There is no evidence of arthropathy or other focal bone abnormality. Soft tissues are unremarkable. IMPRESSION: Negative. Electronically Signed   By: Ulyses Jarred M.D.   On: 11/07/2020 22:03    ____________________________________________   PROCEDURES  Procedure(s) performed (including Critical Care):  Procedures   ____________________________________________   INITIAL IMPRESSION / ASSESSMENT AND PLAN / ED COURSE  Sharece R Chronis was evaluated in Emergency Department on 11/07/2020 for the symptoms described in the history of present illness. She was evaluated in the context of the global COVID-19 pandemic, which necessitated  consideration that the patient might be at risk for infection with the SARS-CoV-2 virus that causes COVID-19. Institutional protocols and algorithms that pertain to the evaluation of patients at risk for COVID-19 are in a state of rapid change based on information released by regulatory bodies including the CDC and federal and state organizations. These policies and algorithms were followed during the patient's care in the ED.    Marland Kitchen  Repeat ultrasound evaluate for worsening clot burden. So point tenderness along bone and able to bend and range the leg. Unlikely fracture.  Will get EKG and chest x-ray due to patient reporting some maybe shortness of breath although her oxygen levels are 99%.  Consider pneumonia, pneumothorax, PE.  The leg is warm and does have a little bit of redness noted to it so maybe there is a component of cellulitis. Not rapidly expanding no crepitus unlikely nec fasc.  2+ distal pulse unlikely arterial issue  Labs are reassuring, chest x-ray negative, markers for PE were reassuring.  I attempted to call the son who stated that he is not sure if the swelling is worse than it was a few days ago.  According to the PA note they discussed comment on some mild swelling so it might be worse.  I also attempted to call patient's friend who lives with her but she did not pick up and had no answering machine.  11:41 PM evaluated patient she is denying shortness of breath.  Again is difficult to tell if she really had shortness of breath earlier or if it was just secondary to her mask but she is adamant that she is not having any shortness of breath right now.  She states that the leg seems to be at the same amount of swelling as it was 5 days ago.  Denies any pain.   She states that she has been compliant with her medications. Exam concerning for cellulitis and US shows soft tissue swelling concerning for cellulitis. Pt well appearing does not appear septic. Slight white count bump no fever. Will  give dose ceftriaxone and start on keflex. Called son to update and discussed return precautions fever etc.         ____________________________________________   FINAL CLINICAL IMPRESSION(S) / ED DIAGNOSES   Final diagnoses:  Cellulitis of right lower extremity      MEDICATIONS GIVEN DURING THIS VISIT:  Medications  cefTRIAXone (ROCEPHIN) 1 g in sodium chloride 0.9 % 100 mL IVPB (has no administration in time range)     ED Discharge Orders         Ordered    cephALEXin (KEFLEX) 500 MG capsule  2 times daily        11/07/20 2350           Note:  This document was prepared using Dragon voice recognition software and may include unintentional dictation errors.   Vanessa Cullom, MD 11/08/20 [phone]

## 2020-11-07 NOTE — ED Triage Notes (Signed)
Pt with complains of leg swelling. Pt with swelling noted to bilateral legs, right is worse than left. Pt states she does not know when it began. Pt denies known fever. Pt with history of CVA. Is a poor historian.

## 2020-11-07 NOTE — Discharge Instructions (Signed)
IMPRESSION:  1. 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  2. Worsening right calf edema which may represent sequelae  associated with underlying cellulitis.

## 2020-11-07 NOTE — ED Notes (Signed)
Patient son, Tawonda Legaspi, at desk stating that if he needs to be reached regarding mother's care his number is [phone]. He is waiting outside.

## 2020-11-08 NOTE — ED Notes (Signed)
Pt's son aware pt will be ready for discharge follow abx in approx 30 minutes, states he will be here to drive pt home.

## 2020-11-09 ENCOUNTER — Ambulatory Visit (INDEPENDENT_AMBULATORY_CARE_PROVIDER_SITE_OTHER): Payer: Medicare Other | Admitting: Nurse Practitioner

## 2020-11-09 ENCOUNTER — Encounter (INDEPENDENT_AMBULATORY_CARE_PROVIDER_SITE_OTHER): Payer: Self-pay | Admitting: Nurse Practitioner

## 2020-11-09 ENCOUNTER — Other Ambulatory Visit: Payer: Self-pay

## 2020-11-09 VITALS — BP 142/89 | HR 66 | Resp 16

## 2020-11-09 DIAGNOSIS — E785 Hyperlipidemia, unspecified: Secondary | ICD-10-CM

## 2020-11-09 DIAGNOSIS — I10 Essential (primary) hypertension: Secondary | ICD-10-CM | POA: Diagnosis not present

## 2020-11-09 DIAGNOSIS — I82503 Chronic embolism and thrombosis of unspecified deep veins of lower extremity, bilateral: Secondary | ICD-10-CM

## 2020-11-09 DIAGNOSIS — D649 Anemia, unspecified: Secondary | ICD-10-CM | POA: Insufficient documentation

## 2020-11-11 ENCOUNTER — Emergency Department
Admission: EM | Admit: 2020-11-11 | Discharge: 2020-11-11 | Discharge status: Absent without leave | Payer: Medicare Other

## 2020-11-15 NOTE — Progress Notes (Signed)
Subjective:    Patient ID: Valerie Bell, female    DOB: 03/03/1966, 55 y.o.   MRN: [id number] Chief Complaint  Patient presents with  . New Patient (Initial Visit)    [facility] ED bilateral le clots    Valerie Bell is a 55 year old female that presents today after recent emergency room visits.  She went to the emergency room on 2 separate occasions.  Once on 11/01/2020 the other on 11/07/2020.  Both are in regards to lower extremity swelling and redness.  On 1 instance the patient was treated for cellulitis.  The patient has had a previous history of DVTs in her bilateral lower extremities.  On each occasion the patient had ultrasounds done which showed chronic thrombus in each lower extremity.  There is no evidence of acute thrombus.  These ultrasound images were also independently reviewed by our office.  The patient has had a previous history of strokes and is largely noncommunicative.  Her son provides much of the history.  The patient is not extensively ambulatory.  Previous drug also makes this difficult as well.  There is no open wounds or ulcerations.  No fever or chills.   Review of Systems  Cardiovascular: Positive for leg swelling.  All other systems reviewed and are negative.      Objective:   Physical Exam Vitals reviewed.  HENT:     Head: Normocephalic.  Cardiovascular:     Rate and Rhythm: Normal rate.  Pulmonary:     Effort: Pulmonary effort is normal.  Musculoskeletal:     Right lower leg: 2+ Pitting Edema present.     Left lower leg: 2+ Pitting Edema present.  Skin:    General: Skin is warm and dry.  Neurological:     Mental Status: She is alert and oriented to person, place, and time. Mental status is at baseline.  Psychiatric:        Attention and Perception: She is inattentive.        Speech: She is noncommunicative.     BP (!) 142/89 (BP Location: Right Arm)   Pulse 66   Resp 16   LMP 12/29/2001   Past Medical History:  Diagnosis Date  . Anxiety   .  Collagen vascular disease ([place])   . DVT (deep venous thrombosis) ([place])   . Hypertension   . Lupus (systemic lupus erythematosus) ([place])   . 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 Spinal cord lesion ([place])   . Stroke [facility])     Social History   Socioeconomic History  . Marital status: Single    Spouse name: Not on file  . Number of children: Not on file  . Years of education: Not on file  . Highest education level: Not on file  Occupational History  . Not on file  Tobacco Use  . Smoking status: Former Smoker    Packs/day: 0.30    Years: 4.00    Pack years: 1.20    Types: Cigarettes    Quit date: 12/30/2003    Years since quitting: 16.8  . Smokeless tobacco: Never Used  Substance and Sexual Activity  . Alcohol use: No    Alcohol/week: 0.0 standard drinks  . Drug use: Yes    Types: Marijuana    Comment: 2 days ago.   Marland Kitchen Sexual activity: Not on file  Other Topics Concern  . Not on file  Social History Narrative  . Not on file   Social Determinants of Health   Financial  Resource Strain: Not on file  Food Insecurity: Not on file  Transportation Needs: Not on file  Physical Activity: Not on file  Stress: Not on file  Social Connections: Not on file  Intimate Partner Violence: Not on file    Past Surgical History:  Procedure Laterality Date  . ABDOMINAL HYSTERECTOMY    . BREAST BIOPSY Right 17+ years ago   negative Core Bx    Family History  Problem Relation Age of Onset  . Arthritis Mother   . Hypertension Mother   . Breast cancer Neg Hx     Allergies  Allergen Reactions  . Ace Inhibitors Other (See Comments)    Angioedema of face    CBC Latest Ref Rng & Units 11/07/2020 09/29/2020 09/12/2020  WBC 4.0 - 10.5 K/uL 10.6(H) 10.9(H) 8.9  Hemoglobin 12.0 - 15.0 g/dL 11.3(L) 13.2 11.8(L)  Hematocrit 36.0 - 46.0 % 35.6(L) 40.5 35.9(L)  Platelets 150 - 400 K/uL 116(L) 134(L) 144(L)      CMP     Component Value Date/Time   NA 139 11/07/2020 2014    NA 136 07/13/2013 1022   K 4.3 11/07/2020 2014   K 3.4 (L) 07/13/2013 1022   CL 106 11/07/2020 2014   CL 107 07/13/2013 1022   CO2 23 11/07/2020 2014   CO2 23 07/13/2013 1022   GLUCOSE 145 (H) 11/07/2020 2014   GLUCOSE 86 07/13/2013 1022   BUN 16 11/07/2020 2014   BUN 19 (H) 07/13/2013 1022   CREATININE 0.97 11/07/2020 2014   CREATININE 0.82 07/13/2013 1022   CALCIUM 9.1 11/07/2020 2014   CALCIUM 8.7 07/13/2013 1022   PROT 8.1 11/07/2020 2014   PROT 6.5 07/13/2013 1022   ALBUMIN 3.5 11/07/2020 2014   ALBUMIN 3.1 (L) 07/13/2013 1022   AST 27 11/07/2020 2014   AST 22 07/13/2013 1022   ALT 15 11/07/2020 2014   ALT 10 (L) 07/13/2013 1022   ALKPHOS 61 11/07/2020 2014   ALKPHOS 34 (L) 07/13/2013 1022   BILITOT 0.5 11/07/2020 2014   BILITOT 0.5 07/13/2013 1022   GFRNONAA >60 11/07/2020 2014   GFRNONAA >60 07/13/2013 1022   GFRAA >60 02/20/2019 0842   GFRAA >60 07/13/2013 1022     No results found.     Assessment & Plan:   1. Benign essential HTN Continue antihypertensive medications as already ordered, these medications have been reviewed and there are no changes at this time.   2. Dyslipidemia Continue statin as ordered and reviewed, no changes at this time   3. 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  I suspect the swelling is related to the patient's previous acute DVTs as well as the fact that she is largely wheelchair-bound.  She tends to remain in a dependent position the majority of the day.  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 I have discussed with the patient the chronic skin changes that accompany venous insufficiency and the long term sequela such as infection and recurring  ulceration.  Patient will be placed in [employer] which will be changed weekly drainage  permitting.  In addition, behavioral modification including several periods of elevation of the lower extremities during the day will be continued. Achieving a position with the ankles at heart level was stressed to the patient  The patient will present to the office  on a weekly basis for Unna wrap changes   Current Outpatient Medications on File Prior to Visit  Medication Sig Dispense Refill  . amLODipine (NORVASC) 10 MG tablet 1 tablet (10 mg total) by G-tube route daily.    Marland Kitchen atorvastatin (LIPITOR) 80 MG tablet Take 80 mg by mouth daily.    . baclofen (LIORESAL) 10 MG tablet SMARTSIG:0.5 Tablet(s) Gastro Tube 3 Times Daily    . calcium-vitamin D (OSCAL WITH D) 500-200 MG-UNIT TABS tablet 1 tablet by Per G Tube route daily.    . cephALEXin (KEFLEX) 500 MG capsule Take 1 capsule (500 mg total) by mouth 2 (two) times daily for 10 days. 20 capsule 0  . Cholecalciferol 25 MCG (1000 UT) tablet Take by mouth.    . hydrALAZINE (APRESOLINE) 10 MG tablet 1 tablet (10 mg total) by J-Tube route Three (3) times a day.    . hydrocortisone 1 % ointment Apply 1 application topically 2 (two) times daily.    . hydroxychloroquine (PLAQUENIL) 200 MG tablet Take 200 mg by mouth daily.    . pantoprazole (PROTONIX) 20 MG tablet Take 20 mg by mouth daily.    . polyethylene glycol powder (GLYCOLAX/MIRALAX) 17 GM/SCOOP powder Take 17 g by mouth daily.    . predniSONE (DELTASONE) 10 MG tablet Take 10 mg by mouth daily with breakfast.    . rivaroxaban (XARELTO) 20 MG TABS tablet Take 1 tablet (20 mg total) by mouth daily with supper. (Patient taking differently: Take 10 mg by mouth daily with supper.) 30 tablet 2  . heparin 25000-0.45 UT/250ML-% infusion Inject 700 Units/hr into the vein continuous. (Patient not taking: Reported on 11/09/2020)    . mirtazapine (REMERON) 7.5 MG tablet Take 7.5 mg by mouth at bedtime.    . senna-docusate (SENOKOT-S) 8.6-50 MG tablet Take 2 tablets by mouth Nightly. (Patient not taking:  Reported on 11/09/2020)     No current facility-administered medications on file prior to visit.    There are no Patient Instructions on file for this visit. No follow-ups on file.   Kris Hartmann, NP

## 2020-11-16 ENCOUNTER — Other Ambulatory Visit: Payer: Self-pay

## 2020-11-16 ENCOUNTER — Ambulatory Visit (INDEPENDENT_AMBULATORY_CARE_PROVIDER_SITE_OTHER): Payer: Medicare Other | Admitting: Nurse Practitioner

## 2020-11-16 ENCOUNTER — Encounter (INDEPENDENT_AMBULATORY_CARE_PROVIDER_SITE_OTHER): Payer: Self-pay | Admitting: Nurse Practitioner

## 2020-11-16 ENCOUNTER — Encounter (INDEPENDENT_AMBULATORY_CARE_PROVIDER_SITE_OTHER): Payer: Self-pay

## 2020-11-16 VITALS — BP 119/76 | HR 97 | Resp 16

## 2020-11-16 DIAGNOSIS — I82503 Chronic embolism and thrombosis of unspecified deep veins of lower extremity, bilateral: Secondary | ICD-10-CM

## 2020-11-16 NOTE — Progress Notes (Signed)
History of Present Illness  There is no documented history at this time  Assessments & Plan   There are no diagnoses linked to this encounter.    Additional instructions  Subjective:  Patient presents with venous ulcer of the Bilateral lower extremity.    Procedure:  3 layer unna wrap was placed Bilateral lower extremity.   Plan:   Follow up in one week.  

## 2020-11-17 ENCOUNTER — Other Ambulatory Visit: Payer: Self-pay

## 2020-11-17 ENCOUNTER — Emergency Department: Payer: Medicare Other

## 2020-11-17 ENCOUNTER — Inpatient Hospital Stay
Admission: EM | Admit: 2020-11-17 | Discharge: 2020-11-20 | DRG: 689 | Disposition: A | Discharge status: Home Health | Payer: Medicare Other | Attending: Internal Medicine | Admitting: Internal Medicine

## 2020-11-17 DIAGNOSIS — N3000 Acute cystitis without hematuria: Secondary | ICD-10-CM

## 2020-11-17 DIAGNOSIS — Z86718 Personal history of other venous thrombosis and embolism: Secondary | ICD-10-CM

## 2020-11-17 DIAGNOSIS — Z79899 Other long term (current) drug therapy: Secondary | ICD-10-CM

## 2020-11-17 DIAGNOSIS — Z7901 Long term (current) use of anticoagulants: Secondary | ICD-10-CM

## 2020-11-17 DIAGNOSIS — L89159 Pressure ulcer of sacral region, unspecified stage: Secondary | ICD-10-CM | POA: Diagnosis present

## 2020-11-17 DIAGNOSIS — Z931 Gastrostomy status: Secondary | ICD-10-CM

## 2020-11-17 DIAGNOSIS — N179 Acute kidney failure, unspecified: Secondary | ICD-10-CM | POA: Diagnosis not present

## 2020-11-17 DIAGNOSIS — Z87891 Personal history of nicotine dependence: Secondary | ICD-10-CM

## 2020-11-17 DIAGNOSIS — I639 Cerebral infarction, unspecified: Secondary | ICD-10-CM | POA: Diagnosis present

## 2020-11-17 DIAGNOSIS — L89322 Pressure ulcer of left buttock, stage 2: Secondary | ICD-10-CM | POA: Diagnosis present

## 2020-11-17 DIAGNOSIS — I10 Essential (primary) hypertension: Secondary | ICD-10-CM | POA: Diagnosis not present

## 2020-11-17 DIAGNOSIS — Z8249 Family history of ischemic heart disease and other diseases of the circulatory system: Secondary | ICD-10-CM

## 2020-11-17 DIAGNOSIS — Z20822 Contact with and (suspected) exposure to covid-19: Secondary | ICD-10-CM | POA: Diagnosis present

## 2020-11-17 DIAGNOSIS — R4182 Altered mental status, unspecified: Secondary | ICD-10-CM

## 2020-11-17 DIAGNOSIS — G959 Disease of spinal cord, unspecified: Secondary | ICD-10-CM | POA: Diagnosis present

## 2020-11-17 DIAGNOSIS — IMO0002 Reserved for concepts with insufficient information to code with codable children: Secondary | ICD-10-CM | POA: Diagnosis present

## 2020-11-17 DIAGNOSIS — Z7952 Long term (current) use of systemic steroids: Secondary | ICD-10-CM

## 2020-11-17 DIAGNOSIS — C9000 Multiple myeloma not having achieved remission: Secondary | ICD-10-CM | POA: Diagnosis present

## 2020-11-17 DIAGNOSIS — M341 CR(E)ST syndrome: Secondary | ICD-10-CM | POA: Diagnosis present

## 2020-11-17 DIAGNOSIS — E860 Dehydration: Secondary | ICD-10-CM | POA: Diagnosis present

## 2020-11-17 DIAGNOSIS — Z90710 Acquired absence of both cervix and uterus: Secondary | ICD-10-CM

## 2020-11-17 DIAGNOSIS — M329 Systemic lupus erythematosus, unspecified: Secondary | ICD-10-CM | POA: Diagnosis present

## 2020-11-17 DIAGNOSIS — G9341 Metabolic encephalopathy: Secondary | ICD-10-CM | POA: Diagnosis present

## 2020-11-17 DIAGNOSIS — F32A Depression, unspecified: Secondary | ICD-10-CM | POA: Diagnosis present

## 2020-11-17 DIAGNOSIS — E785 Hyperlipidemia, unspecified: Secondary | ICD-10-CM | POA: Diagnosis present

## 2020-11-17 DIAGNOSIS — Z993 Dependence on wheelchair: Secondary | ICD-10-CM

## 2020-11-17 DIAGNOSIS — Z888 Allergy status to other drugs, medicaments and biological substances status: Secondary | ICD-10-CM

## 2020-11-17 DIAGNOSIS — D696 Thrombocytopenia, unspecified: Secondary | ICD-10-CM | POA: Diagnosis present

## 2020-11-17 DIAGNOSIS — I69351 Hemiplegia and hemiparesis following cerebral infarction affecting right dominant side: Secondary | ICD-10-CM

## 2020-11-17 DIAGNOSIS — N390 Urinary tract infection, site not specified: Principal | ICD-10-CM | POA: Diagnosis present

## 2020-11-17 DIAGNOSIS — F419 Anxiety disorder, unspecified: Secondary | ICD-10-CM | POA: Diagnosis present

## 2020-11-17 LAB — RESP PANEL BY RT-PCR (FLU A&B, COVID) ARPGX2
Influenza A by PCR: NEGATIVE
Influenza B by PCR: NEGATIVE
SARS Coronavirus 2 by RT PCR: NEGATIVE

## 2020-11-17 LAB — COMPREHENSIVE METABOLIC PANEL
ALT: 17 U/L (ref 0–44)
AST: 24 U/L (ref 15–41)
Albumin: 4.4 g/dL (ref 3.5–5.0)
Alkaline Phosphatase: 64 U/L (ref 38–126)
Anion gap: 11 (ref 5–15)
BUN: 28 mg/dL — ABNORMAL HIGH (ref 6–20)
CO2: 26 mmol/L (ref 22–32)
Calcium: 9.7 mg/dL (ref 8.9–10.3)
Chloride: 99 mmol/L (ref 98–111)
Creatinine, Ser: 1.48 mg/dL — ABNORMAL HIGH (ref 0.44–1.00)
GFR, Estimated: 42 mL/min — ABNORMAL LOW (ref >60)
Glucose, Bld: 89 mg/dL (ref 70–99)
Potassium: 3.7 mmol/L (ref 3.5–5.1)
Sodium: 136 mmol/L (ref 135–145)
Total Bilirubin: 0.6 mg/dL (ref 0.3–1.2)
Total Protein: 10.2 g/dL — ABNORMAL HIGH (ref 6.5–8.1)

## 2020-11-17 LAB — URINE DRUG SCREEN, QUALITATIVE (ARMC ONLY)
Amphetamines, Ur Screen: NOT DETECTED
Barbiturates, Ur Screen: NOT DETECTED
Benzodiazepine, Ur Scrn: NOT DETECTED
Cannabinoid 50 Ng, Ur ~~LOC~~: POSITIVE — AB
Cocaine Metabolite,Ur ~~LOC~~: NOT DETECTED
MDMA (Ecstasy)Ur Screen: NOT DETECTED
Methadone Scn, Ur: NOT DETECTED
Opiate, Ur Screen: NOT DETECTED
Phencyclidine (PCP) Ur S: NOT DETECTED
Tricyclic, Ur Screen: NOT DETECTED

## 2020-11-17 LAB — AMMONIA: Ammonia: 27 umol/L (ref 9–35)

## 2020-11-17 LAB — ACETAMINOPHEN LEVEL: Acetaminophen (Tylenol), Serum: <10 ug/mL — ABNORMAL LOW (ref 10–30)

## 2020-11-17 LAB — URINALYSIS, COMPLETE (UACMP) WITH MICROSCOPIC
Bilirubin Urine: NEGATIVE
Glucose, UA: NEGATIVE mg/dL
Ketones, ur: NEGATIVE mg/dL
Nitrite: NEGATIVE
Protein, ur: 30 mg/dL — AB
Specific Gravity, Urine: 1.025 (ref 1.005–1.030)
pH: 5 (ref 5.0–8.0)

## 2020-11-17 LAB — SAVE SMEAR(SSMR), FOR PROVIDER SLIDE REVIEW

## 2020-11-17 LAB — CBC
HCT: 40.9 % (ref 36.0–46.0)
Hemoglobin: 13.4 g/dL (ref 12.0–15.0)
MCH: 31.2 pg (ref 26.0–34.0)
MCHC: 32.8 g/dL (ref 30.0–36.0)
MCV: 95.1 fL (ref 80.0–100.0)
Platelets: 116 10*3/uL — ABNORMAL LOW (ref 150–400)
RBC: 4.3 MIL/uL (ref 3.87–5.11)
RDW: 13.3 % (ref 11.5–15.5)
WBC: 8.8 10*3/uL (ref 4.0–10.5)
nRBC: 0 % (ref 0.0–0.2)

## 2020-11-17 LAB — ETHANOL: Alcohol, Ethyl (B): <10 mg/dL (ref <10)

## 2020-11-17 LAB — SALICYLATE LEVEL: Salicylate Lvl: <7 mg/dL — ABNORMAL LOW (ref 7.0–30.0)

## 2020-11-17 LAB — TROPONIN I (HIGH SENSITIVITY): Troponin I (High Sensitivity): 5 ng/L (ref <18)

## 2020-11-17 LAB — PROTIME-INR
INR: 1.6 — ABNORMAL HIGH (ref 0.8–1.2)
Prothrombin Time: 19 seconds — ABNORMAL HIGH (ref 11.4–15.2)

## 2020-11-17 LAB — CBG MONITORING, ED: Glucose-Capillary: 115 mg/dL — ABNORMAL HIGH (ref 70–99)

## 2020-11-17 LAB — LACTATE DEHYDROGENASE: LDH: 172 U/L (ref 98–192)

## 2020-11-17 MED ORDER — ENOXAPARIN SODIUM 80 MG/0.8ML IJ SOSY
1.0000 mg/kg | PREFILLED_SYRINGE | Freq: Once | INTRAMUSCULAR | Status: AC
  Administered 2020-11-17: 65 mg via SUBCUTANEOUS
  Filled 2020-11-17: qty 0.65

## 2020-11-17 MED ORDER — BACLOFEN 10 MG PO TABS
10.0000 mg | ORAL_TABLET | Freq: Three times a day (TID) | ORAL | Status: DC
  Administered 2020-11-17 – 2020-11-20 (×8): 10 mg via ORAL
  Filled 2020-11-17 (×10): qty 1

## 2020-11-17 MED ORDER — PREDNISONE 10 MG PO TABS
10.0000 mg | ORAL_TABLET | Freq: Every day | ORAL | Status: DC
  Administered 2020-11-18 – 2020-11-20 (×3): 10 mg via ORAL
  Filled 2020-11-17 (×3): qty 1

## 2020-11-17 MED ORDER — POLYETHYLENE GLYCOL 3350 17 G PO PACK
17.0000 g | PACK | Freq: Every day | ORAL | Status: DC
  Administered 2020-11-18: 09:00:00 17 g via ORAL
  Filled 2020-11-17: qty 1

## 2020-11-17 MED ORDER — MIRTAZAPINE 15 MG PO TABS
7.5000 mg | ORAL_TABLET | Freq: Every day | ORAL | Status: DC
  Administered 2020-11-17 – 2020-11-19 (×3): 7.5 mg via ORAL
  Filled 2020-11-17 (×3): qty 1

## 2020-11-17 MED ORDER — AMLODIPINE BESYLATE 10 MG PO TABS
10.0000 mg | ORAL_TABLET | Freq: Every day | ORAL | Status: DC
  Administered 2020-11-18 – 2020-11-20 (×3): 10 mg via ORAL
  Filled 2020-11-17 (×3): qty 1

## 2020-11-17 MED ORDER — SODIUM CHLORIDE 0.9 % IV BOLUS
1000.0000 mL | Freq: Once | INTRAVENOUS | Status: AC
  Administered 2020-11-17: 1000 mL via INTRAVENOUS

## 2020-11-17 MED ORDER — CALCIUM CARBONATE-VITAMIN D 500-200 MG-UNIT PO TABS
1.0000 | ORAL_TABLET | Freq: Every day | ORAL | Status: DC
  Administered 2020-11-18: 09:00:00 1 via ORAL
  Filled 2020-11-17: qty 1

## 2020-11-17 MED ORDER — HYDROXYCHLOROQUINE SULFATE 200 MG PO TABS
200.0000 mg | ORAL_TABLET | Freq: Every day | ORAL | Status: DC
  Administered 2020-11-18 – 2020-11-20 (×3): 200 mg via ORAL
  Filled 2020-11-17 (×3): qty 1

## 2020-11-17 MED ORDER — PANTOPRAZOLE SODIUM 20 MG PO TBEC
20.0000 mg | DELAYED_RELEASE_TABLET | Freq: Every day | ORAL | Status: DC
  Administered 2020-11-18 – 2020-11-20 (×3): 20 mg via ORAL
  Filled 2020-11-17 (×3): qty 1

## 2020-11-17 MED ORDER — SODIUM CHLORIDE 0.9 % IV SOLN
1.0000 g | Freq: Once | INTRAVENOUS | Status: AC
  Administered 2020-11-18: 15:00:00 1 g via INTRAVENOUS
  Filled 2020-11-17: qty 1

## 2020-11-17 MED ORDER — ACETAMINOPHEN 650 MG RE SUPP: 650.0000 mg | Freq: Four times a day (QID) | RECTAL | Status: DC | PRN

## 2020-11-17 MED ORDER — VITAMIN D 25 MCG (1000 UNIT) PO TABS
1000.0000 [IU] | ORAL_TABLET | Freq: Every day | ORAL | Status: DC
  Administered 2020-11-18 – 2020-11-20 (×3): 1000 [IU] via ORAL
  Filled 2020-11-17 (×3): qty 1

## 2020-11-17 MED ORDER — ONDANSETRON HCL 4 MG/2ML IJ SOLN: 4.0000 mg | Freq: Three times a day (TID) | INTRAMUSCULAR | Status: DC | PRN

## 2020-11-17 MED ORDER — HYDROCORTISONE NA SUCCINATE PF 100 MG IJ SOLR
50.0000 mg | Freq: Once | INTRAMUSCULAR | Status: AC
  Administered 2020-11-17: 50 mg via INTRAVENOUS
  Filled 2020-11-17: qty 2

## 2020-11-17 MED ORDER — HYDROCORTISONE 1 % EX OINT
1.0000 "application " | TOPICAL_OINTMENT | Freq: Two times a day (BID) | CUTANEOUS | Status: DC
  Administered 2020-11-17 – 2020-11-19 (×5): 1 via TOPICAL
  Filled 2020-11-17: qty 28.35

## 2020-11-17 MED ORDER — RIVAROXABAN 10 MG PO TABS
10.0000 mg | ORAL_TABLET | Freq: Every day | ORAL | Status: DC
  Administered 2020-11-18 – 2020-11-19 (×2): 10 mg via ORAL
  Filled 2020-11-17 (×3): qty 1

## 2020-11-17 MED ORDER — ATORVASTATIN CALCIUM 20 MG PO TABS
80.0000 mg | ORAL_TABLET | Freq: Every day | ORAL | Status: DC
  Administered 2020-11-18 – 2020-11-19 (×2): 80 mg via ORAL
  Filled 2020-11-17 (×2): qty 4

## 2020-11-17 MED ORDER — SODIUM CHLORIDE 0.9 % IV SOLN
1.0000 g | Freq: Once | INTRAVENOUS | Status: AC
  Administered 2020-11-17: 1 g via INTRAVENOUS
  Filled 2020-11-17: qty 10

## 2020-11-17 MED ORDER — HYDRALAZINE HCL 20 MG/ML IJ SOLN
5.0000 mg | INTRAMUSCULAR | Status: DC | PRN
  Administered 2020-11-18 – 2020-11-19 (×2): 5 mg via INTRAVENOUS
  Filled 2020-11-17 (×2): qty 1

## 2020-11-17 NOTE — ED Notes (Signed)
Bedbugs noted on pt.

## 2020-11-17 NOTE — ED Notes (Signed)
Caregiver at bedside. Caregiver states that LKW was 11pm yesterday. States that pt has R sided deficits since stroke 2 years ago. States that pt was seen at [facility] 1 week ago for "leg problems" . Both lower legs are wrapped with coban because of "blood clot problems". States that IV was left in after last ED visit and remained in for 3 days until discovered and was brought back for IV removal.

## 2020-11-17 NOTE — ED Notes (Signed)
Performed decontamination of pt and bed so pt can go to CT at 12. Pt entire body is covered in bites from bedbugs. Legs, arms, hands, trunk. Caregiver at bedside, states is unaware if pt has bedbugs. Pt has stage 2 pressure injury inner R buttock. Moisture barrier cream and sacral foam applied. Pt had dried feces in chux diaper. Peri care performed and chux changed. Purewick applied. Multiple bedbugs noted to be crawling on pt in all stages of growth.

## 2020-11-17 NOTE — ED Provider Notes (Signed)
[facility] Emergency Department Provider Note  Time seen: 9:09 AM  I have reviewed the triage vital signs and the nursing notes.   HISTORY  Chief Complaint Altered Mental Status   HPI Valerie Bell is a 55 y.o. female with a past medical history of anxiety, prior DVT, hypertension, lupus, CVA with right-sided deficits, presents to the emergency department for altered mental status.   According to EMS and patient's room and she was acting normal last night however this morning she has been altered not as responsive and very sluggish.  States normally the patient ambulates with a walker but today could not get out of bed.  Patient will occasionally shake her head yes or no to answer questions but for the most part does not answer questions.  Will minimally follow commands with minimal effort.  Patient not speaking currently.  Keeps her eyes closed throughout most of the exam.  Past Medical History:  Diagnosis Date  . Anxiety   . Collagen vascular disease ([place])   . DVT (deep venous thrombosis) ([place])   . Hypertension   . Lupus (systemic lupus erythematosus) ([place])   . 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 Spinal cord lesion ([place])   . Stroke [facility])     Patient Active Problem List   Diagnosis Date Noted  . Normocytic anemia 11/09/2020  . Dyslipidemia 07/08/2019  . Acute deep vein thrombosis (DVT) of left lower extremity ([place]) 06/20/2019  . 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 Gastrostomy tube in place ([place]) 06/20/2019  . 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  . PRES (posterior reversible encephalopathy syndrome) 02/24/2019  . Dysphagia 02/20/2019  . Chronic deep vein thrombosis (DVT) of femoral vein of right lower extremity ([place]) 09/04/2018  . Pressure injury of skin 09/02/2018  . Acute encephalopathy 08/27/2018  . Encephalitis 08/27/2018  . Benign essential HTN 08/27/2018  . Leucocytosis 08/27/2018  . Lupus  ([place]) 08/27/2018  . DVT (deep vein thrombosis) in pregnancy 08/27/2018  . Lethargy 08/24/2018  . Weakness 01/07/2018  . 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 Chest pain 03/30/2017    Past Surgical History:  Procedure Laterality Date  . ABDOMINAL HYSTERECTOMY    . BREAST BIOPSY Right 17+ years ago   negative Core Bx    Prior to Admission medications   Medication Sig Start Date End Date Taking? Authorizing Provider  amLODipine (NORVASC) 10 MG tablet 1 tablet (10 mg total) by G-tube route daily. 03/06/19   [provider]  atorvastatin (LIPITOR) 80 MG tablet Take 80 mg by mouth daily.    [provider]  baclofen (LIORESAL) 10 MG tablet SMARTSIG:0.5 Tablet(s) Gastro Tube 3 Times Daily 09/20/20   [provider]  calcium-vitamin D (OSCAL WITH D) 500-200 MG-UNIT TABS tablet 1 tablet by Per G Tube route daily. 03/06/19   [provider]  cephALEXin (KEFLEX) 500 MG capsule Take 1 capsule (500 mg total) by mouth 2 (two) times daily for 10 days. 11/07/20 11/17/20  Vanessa , MD  Cholecalciferol 25 MCG (1000 UT) tablet Take by mouth. 03/06/19   [provider]  heparin 25000-0.45 UT/250ML-% infusion Inject 700 Units/hr into the vein continuous. Patient not taking: No sig reported 08/25/18   Dustin Flock, MD  hydrALAZINE (APRESOLINE) 10 MG tablet 1 tablet (10 mg total) by J-Tube route Three (3) times a day. 09/02/19   [provider]  hydrocortisone 1 % ointment Apply 1 application topically  2 (two) times daily. 09/13/18   [provider]  hydroxychloroquine (PLAQUENIL) 200 MG tablet Take 200 mg by mouth daily.    [provider]  mirtazapine (REMERON) 7.5 MG tablet Take 7.5 mg by mouth at bedtime. 01/14/19 01/14/20  [provider]  pantoprazole (PROTONIX) 20 MG tablet Take 20 mg by mouth daily.    [provider]  polyethylene glycol powder (GLYCOLAX/MIRALAX)  17 GM/SCOOP powder Take 17 g by mouth daily. 09/13/18   [provider]  predniSONE (DELTASONE) 10 MG tablet Take 10 mg by mouth daily with breakfast.    [provider]  rivaroxaban (XARELTO) 20 MG TABS tablet Take 1 tablet (20 mg total) by mouth daily with supper. Patient taking differently: Take 10 mg by mouth daily with supper. 09/12/19 10/28/20  Earleen Newport, MD  senna-docusate (SENOKOT-S) 8.6-50 MG tablet Take 2 tablets by mouth Nightly. Patient not taking: No sig reported 09/13/18   [provider]    Allergies  Allergen Reactions  . Ace Inhibitors Other (See Comments)    Angioedema of face    Family History  Problem Relation Age of Onset  . Arthritis Mother   . Hypertension Mother   . Breast cancer Neg Hx     Social History Social History   Tobacco Use  . Smoking status: Former Smoker    Packs/day: 0.30    Years: 4.00    Pack years: 1.20    Types: Cigarettes    Quit date: 12/30/2003    Years since quitting: 16.8  . Smokeless tobacco: Never Used  Substance Use Topics  . Alcohol use: No    Alcohol/week: 0.0 standard drinks  . Drug use: Yes    Types: Marijuana    Comment: 2 days ago.     Review of Systems Unable to obtain an adequate/accurate review of systems secondary to patient cooperation and altered state  ____________________________________________   PHYSICAL EXAM:  VITAL SIGNS: ED Triage Vitals  Enc Vitals Group     BP 11/17/20 0839 116/86     Pulse Rate 11/17/20 0839 80     Resp 11/17/20 0839 16     Temp 11/17/20 0839 97.7 F (36.5 C)     Temp Source 11/17/20 0839 Oral     SpO2 11/17/20 0856 98 %     Weight --      Height --      Head Circumference --      Peak Flow --      Pain Score --      Pain Loc --      Pain Edu? --      Excl. in [place]? --     Constitutional: Somnolent keeps her eyes closed for most of the exam.  Will occasionally shake her head to attempt to answer question but is not speaking will  follow some commands but with minimal effort. Eyes: Normal exam ENT      Head: Normocephalic and atraumatic.      Mouth/Throat: Mucous membranes are moist. Cardiovascular: Normal rate, regular rhythm Respiratory: Normal respiratory effort without tachypnea nor retractions. Breath sounds are clear Gastrointestinal: Soft and nontender. No distention.   Musculoskeletal: Contracted right upper extremity Neurologic: Patient not currently speaking, occasionally shake her head.  Minimal effort throughout entire exam.  Baseline right-sided deficits.  Unable to adequately assess neurologically due to effort cooperation and mental state Skin:  Skin is warm, dry and intact.  Psychiatric: Mood and affect are normal.   ____________________________________________  EKG  EKG viewed and interpreted by myself shows a normal sinus rhythm 81 bpm with a narrow QRS, left axis deviation, largely normal intervals with no concerning ST changes.  ____________________________________________    RADIOLOGY  Scan of the head is negative for acute abnormality Chest x-ray negative  ____________________________________________   INITIAL IMPRESSION / ASSESSMENT AND PLAN / ED COURSE  Pertinent labs & imaging results that were available during my care of the patient were reviewed by me and considered in my medical decision making (see chart for details).   Patient presents to the emergency department for altered mental state, last known normal was last night.  Patient has a history of CVA with right-sided deficits.  Patient is not able to provide much or any history unable to provide review of systems.  Patient will follow commands but with minimal effort.  We will check labs, CT scan, urinalysis, COVID.  We will continue to closely monitor and hydrate while awaiting results.  CT scan and chest x-ray are negative for acute abnormality.  Lab work shows significant urinary tract infection.  Given the patient's  altered mental status and UTI we will start on IV Rocephin and admit to the hospital service.  Patient also found to have extensive bedbugs and covered in likely bedbug bites.  Mild sacral ulcerations as well.  DAYRA RAPLEY was evaluated in Emergency Department on 11/17/2020 for the symptoms described in the history of present illness. She was evaluated in the context of the global COVID-19 pandemic, which necessitated consideration that the patient might be at risk for infection with the SARS-CoV-2 virus that causes COVID-19. Institutional protocols and algorithms that pertain to the evaluation of patients at risk for COVID-19 are in a state of rapid change based on information released by regulatory bodies including the CDC and federal and state organizations. These policies and algorithms were followed during the patient's care in the ED.  ____________________________________________   FINAL CLINICAL IMPRESSION(S) / ED DIAGNOSES  Altered mental state Urinary tract infection   Harvest Dark, MD 11/17/20 1422

## 2020-11-17 NOTE — ED Notes (Signed)
CT on way to get pt. Informed that pt has been decontaminated.

## 2020-11-17 NOTE — H&P (Signed)
History and Physical    Valerie Bell MGQ:[id number] DOB: September 03, 1965 DOA: 11/17/2020  Referring MD/NP/PA:   PCP: [facility]   Patient coming from:  The patient is coming from home.   Chief Complaint: AMS  HPI: Valerie Bell is a 55 y.o. female with medical history significant of hypertension, hyperlipidemia, stroke, GERD, depression, anxiety, DVT on Xarelto, spinal cord lesion, wheelchair dependent, smoldering multiple myeloma, lupus, PRES syndrome, who presents with altered mental status.  Per her caregiver (I called her caregiver by phone), at her normal baseline, patient is oriented to the person and place, sometimes orientated to time.  Patient was last known normal at about 11:20 PM yesterday, but found to be confused today. Patient has history of stroke with right-sided weakness and slurred speech.  When I saw patient in ED, he is confused, but arousable, he is not orientated x3.  No active nausea vomiting, diarrhea noted.  No active cough, respiratory stress noted.  Per her caregiver, patient does not seem to have chest pain or abdominal pain.  Not sure if patient has symptoms of UTI.  Patient has sacral ulcer.  Patient has history of DVT, both legs were wrapped up.  Per nurse report, patient has bedbug.  ED Course: pt was found to have WBC 8.8, positive urinalysis (hazy appearance, large amount of leukocyte, many bacteria, WBC 21-50), INR 1.6, troponin level 5, UDS positive for cannabinoid, Tylenol level less than 10, salicylate level less than 7, COVID-19 negative, platelet 116, AKI with creatinine 1.48, BUN 28 (recent creatinine 1.07 on 09/29/2020), temperature normal, blood pressure 126/85, heart rate 67, RR 18, oxygen saturation 96% on room air.  CT of head is negative for acute intracranial abnormalities.  Chest x-ray has low lung volumes without obvious infiltration. Patient is placed on MedSurg bed for observation    Review of Systems: Could not be reviewed due to  altered mental status  Allergy:  Allergies  Allergen Reactions  . Ace Inhibitors Other (See Comments)    Angioedema of face    Past Medical History:  Diagnosis Date  . Anxiety   . Collagen vascular disease ([place])   . DVT (deep venous thrombosis) ([place])   . Hypertension   . Lupus (systemic lupus erythematosus) ([place])   . Smoldering multiple myeloma ([place]) 06/28/2017  . Spinal cord lesion ([place])   . Stroke [facility])     Past Surgical History:  Procedure Laterality Date  . ABDOMINAL HYSTERECTOMY    . BREAST BIOPSY Right 17+ years ago   negative Core Bx    Social History:  reports that she quit smoking about 16 years ago. Her smoking use included cigarettes. She has a 1.20 pack-year smoking history. She has never used smokeless tobacco. She reports current drug use. Drug: Marijuana. She reports that she does not drink alcohol.  Family History:  Family History  Problem Relation Age of Onset  . Arthritis Mother   . Hypertension Mother   . Breast cancer Neg Hx      Prior to Admission medications   Medication Sig Start Date End Date Taking? Authorizing Provider  amLODipine (NORVASC) 10 MG tablet 1 tablet (10 mg total) by G-tube route daily. 03/06/19   [provider]  atorvastatin (LIPITOR) 80 MG tablet Take 80 mg by mouth daily.    [provider]  baclofen (LIORESAL) 10 MG tablet SMARTSIG:0.5 Tablet(s) Gastro Tube 3 Times Daily 09/20/20   [provider]  calcium-vitamin D (OSCAL WITH D) 500-200  MG-UNIT TABS tablet 1 tablet by Per G Tube route daily. 03/06/19   [provider]  cephALEXin (KEFLEX) 500 MG capsule Take 1 capsule (500 mg total) by mouth 2 (two) times daily for 10 days. 11/07/20 11/17/20  Vanessa Hale, MD  Cholecalciferol 25 MCG (1000 UT) tablet Take by mouth. 03/06/19   [provider]  heparin 25000-0.45 UT/250ML-% infusion Inject 700 Units/hr into the vein continuous. Patient not taking: No sig reported 08/25/18   Dustin Flock, MD   hydrALAZINE (APRESOLINE) 10 MG tablet 1 tablet (10 mg total) by J-Tube route Three (3) times a day. 09/02/19   [provider]  hydrocortisone 1 % ointment Apply 1 application topically 2 (two) times daily. 09/13/18   [provider]  hydroxychloroquine (PLAQUENIL) 200 MG tablet Take 200 mg by mouth daily.    [provider]  mirtazapine (REMERON) 7.5 MG tablet Take 7.5 mg by mouth at bedtime. 01/14/19 01/14/20  [provider]  pantoprazole (PROTONIX) 20 MG tablet Take 20 mg by mouth daily.    [provider]  polyethylene glycol powder (GLYCOLAX/MIRALAX) 17 GM/SCOOP powder Take 17 g by mouth daily. 09/13/18   [provider]  predniSONE (DELTASONE) 10 MG tablet Take 10 mg by mouth daily with breakfast.    [provider]  rivaroxaban (XARELTO) 20 MG TABS tablet Take 1 tablet (20 mg total) by mouth daily with supper. Patient taking differently: Take 10 mg by mouth daily with supper. 09/12/19 10/28/20  Earleen Newport, MD  senna-docusate (SENOKOT-S) 8.6-50 MG tablet Take 2 tablets by mouth Nightly. Patient not taking: No sig reported 09/13/18   [provider]    Physical Exam: Vitals:   11/17/20 1430 11/17/20 1500 11/17/20 1630 11/17/20 1730  BP: 128/76 126/85 131/78 (!) 157/86  Pulse: 70 67 62 61  Resp: $Remo'10 11 10 12  'pXSXf$ Temp:      TempSrc:      SpO2: 98% 100% 99% 100%   General: Not in acute distress HEENT:       Eyes: PERRL, EOMI, no scleral icterus.       ENT: No discharge from the ears and nose       Neck: No JVD, no bruit, no mass felt. Heme: No neck lymph node enlargement. Cardiac: S1/S2, RRR, No murmurs, No gallops or rubs. Respiratory: No rales, wheezing, rhonchi or rubs. GI: Soft, nondistended, nontender, no organomegaly, BS present. GU: No hematuria Ext: Both legs are wrapped up Musculoskeletal: No joint deformities, No joint redness or warmth, no limitation of ROM in spin. Skin: Has scattered rash in  lower abdomen Neuro: Confused, arousable, not oriented X3, cranial nerves II-XII grossly intact. Psych: Patient is not psychotic, no suicidal or hemocidal ideation.  Labs on Admission: I have personally reviewed following labs and imaging studies  CBC: Recent Labs  Lab 11/17/20 0907  WBC 8.8  HGB 13.4  HCT 40.9  MCV 95.1  PLT 767*   Basic Metabolic Panel: Recent Labs  Lab 11/17/20 0907  NA 136  K 3.7  CL 99  CO2 26  GLUCOSE 89  BUN 28*  CREATININE 1.48*  CALCIUM 9.7   GFR: Estimated Creatinine Clearance: 40.6 mL/min (A) (by C-G formula based on SCr of 1.48 mg/dL (H)). Liver Function Tests: Recent Labs  Lab 11/17/20 0907  AST 24  ALT 17  ALKPHOS 64  BILITOT 0.6  PROT 10.2*  ALBUMIN 4.4   No results for input(s): LIPASE, AMYLASE in the last 168 hours. Recent  Labs  Lab 11/17/20 1720  AMMONIA 27   Coagulation Profile: Recent Labs  Lab 11/17/20 0907  INR 1.6*   Cardiac Enzymes: No results for input(s): CKTOTAL, CKMB, CKMBINDEX, TROPONINI in the last 168 hours. BNP (last 3 results) No results for input(s): PROBNP in the last 8760 hours. HbA1C: No results for input(s): HGBA1C in the last 72 hours. CBG: Recent Labs  Lab 11/17/20 0851  GLUCAP 115*   Lipid Profile: No results for input(s): CHOL, HDL, LDLCALC, TRIG, CHOLHDL, LDLDIRECT in the last 72 hours. Thyroid Function Tests: No results for input(s): TSH, T4TOTAL, FREET4, T3FREE, THYROIDAB in the last 72 hours. Anemia Panel: No results for input(s): VITAMINB12, FOLATE, FERRITIN, TIBC, IRON, RETICCTPCT in the last 72 hours. Urine analysis:    Component Value Date/Time   COLORURINE YELLOW (A) 11/17/2020 1330   APPEARANCEUR HAZY (A) 11/17/2020 1330   APPEARANCEUR Clear 07/13/2013 1022   LABSPEC 1.025 11/17/2020 1330   LABSPEC 1.029 07/13/2013 1022   PHURINE 5.0 11/17/2020 1330   GLUCOSEU NEGATIVE 11/17/2020 1330   GLUCOSEU Negative 07/13/2013 1022   HGBUR MODERATE (A) 11/17/2020 1330    BILIRUBINUR NEGATIVE 11/17/2020 1330   BILIRUBINUR Negative 07/13/2013 1022   KETONESUR NEGATIVE 11/17/2020 1330   PROTEINUR 30 (A) 11/17/2020 1330   NITRITE NEGATIVE 11/17/2020 1330   LEUKOCYTESUR LARGE (A) 11/17/2020 1330   LEUKOCYTESUR Negative 07/13/2013 1022   Sepsis Labs: $RemoveBefo'@[id number]'[id number]$ (procalcitonin:4,lacticidven:4) ) Recent Results (from the past 240 hour(s))  Resp Panel by RT-PCR (Flu A&B, Covid) Nasopharyngeal Swab     Status: None   Collection Time: 11/17/20 11:00 AM   Specimen: Nasopharyngeal Swab; Nasopharyngeal(NP) swabs in vial transport medium  Result Value Ref Range Status   SARS Coronavirus 2 by RT PCR NEGATIVE NEGATIVE Final    Comment: (NOTE) SARS-CoV-2 target nucleic acids are NOT DETECTED.  The SARS-CoV-2 RNA is generally detectable in upper respiratory specimens during the acute phase of infection. The lowest concentration of SARS-CoV-2 viral copies this assay can detect is 138 copies/mL. A negative result does not preclude SARS-Cov-2 infection and should not be used as the sole basis for treatment or other patient management decisions. A negative result may occur with  improper specimen collection/handling, submission of specimen other than nasopharyngeal swab, presence of viral mutation(s) within the areas targeted by this assay, and inadequate number of viral copies(<138 copies/mL). A negative result must be combined with clinical observations, patient history, and epidemiological information. The expected result is Negative.  Fact Sheet for Patients:  EntrepreneurPulse.com.au  Fact Sheet for Healthcare Providers:  IncredibleEmployment.be  This test is no t yet approved or cleared by the [place] FDA and  has been authorized for detection and/or diagnosis of SARS-CoV-2 by FDA under an Emergency Use Authorization (EUA). This EUA will remain  in effect (meaning this test can be used) for the duration of  the COVID-19 declaration under Section 564(b)(1) of the Act, 21 U.S.C.section 360bbb-3(b)(1), unless the authorization is terminated  or revoked sooner.       Influenza A by PCR NEGATIVE NEGATIVE Final   Influenza B by PCR NEGATIVE NEGATIVE Final    Comment: (NOTE) The Xpert Xpress SARS-CoV-2/FLU/RSV plus assay is intended as an aid in the diagnosis of influenza from Nasopharyngeal swab specimens and should not be used as a sole basis for treatment. Nasal washings and aspirates are unacceptable for Xpert Xpress SARS-CoV-2/FLU/RSV testing.  Fact Sheet for Patients: EntrepreneurPulse.com.au  Fact Sheet for Healthcare Providers: IncredibleEmployment.be  This test is not yet approved or cleared  by the [place] and has been authorized for detection and/or diagnosis of SARS-CoV-2 by FDA under an Emergency Use Authorization (EUA). This EUA will remain in effect (meaning this test can be used) for the duration of the COVID-19 declaration under Section 564(b)(1) of the Act, 21 U.S.C. section 360bbb-3(b)(1), unless the authorization is terminated or revoked.  Performed at [facility], [address] [place], [place] [address]      Radiological Exams on Admission: CT Head Wo Contrast  Result Date: 11/17/2020 CLINICAL DATA:  Mental status change EXAM: CT HEAD WITHOUT CONTRAST TECHNIQUE: Contiguous axial images were obtained from the base of the skull through the vertex without intravenous contrast. COMPARISON:  CT head 09/29/2020 FINDINGS: Brain: Generalized atrophy unchanged. Negative for hydrocephalus. Mild white matter hypodensity bilaterally unchanged. Small chronic infarct right occipital lobe unchanged. 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 Frontal sinuses otherwise well aerated. Negative orbit Other: None  IMPRESSION: No acute abnormality. No change from the recent CT. Electronically Signed   By: Franchot Gallo M.D.   On: 11/17/2020 13:17   DG Chest Portable 1 View  Result Date: 11/17/2020 CLINICAL DATA:  Altered mental status. EXAM: PORTABLE CHEST 1 VIEW COMPARISON:  CT 09/12/2020.  Chest x-ray 11/07/2020. FINDINGS: Patient is rotated to the right. Mediastinum hilar structures stable. Tortuous thoracic aorta again noted. Low lung volumes with mild bibasilar atelectasis/infiltrates. No pleural effusion or pneumothorax. IMPRESSION: Lung volumes with mild bibasilar atelectasis/infiltrates. No pleural effusion or pneumothorax. Electronically Signed   By: Marcello Moores  Register   On: 11/17/2020 09:33     EKG: I have personally reviewed.  Sinus rhythm, QTC 401, nonspecific T wave change  Assessment/Plan Principal Problem:   UTI (urinary tract infection) Active Problems:   Acute metabolic encephalopathy   Benign essential HTN   Lupus (HCC)   Dyslipidemia   Spinal cord lesion (HCC)   Stroke (HCC)   Thrombocytopenia (HCC)   Sacral pressure ulcer   Depression   AKI (acute kidney injury) ([place])   UTI (urinary tract infection): pt does not have sepsis. -Placed on MedSurg bed follow-up physician -IV Rocephin -Follow-up of blood culture and urine culture  Acute metabolic encephalopathy: Possibly due to UTI.  CT head is negative for acute intracranial abnormalities -Frequent neuro check  Benign essential HTN -As needed hydralazine -Amlodipine  DVT: per her care giver, pt is still taking Xarelto -Continue Xarelto -will give one dose of Lovenox today since pt may not be able to taking this med due to AMS  Lupus ([place]) -Continue Plaquenil -Continue home prednisone 10 mg daily -Give Solu-Cortef 50 mg as stress dose  Dyslipidemia -Lipitor  Spinal cord lesion (HCC) -Wheelchair-bound -Baclofen  Stroke (HCC) -Lipitor  Thrombocytopenia ([place]): Platelet 116, likely due to fungal  infection -Check LDH and peripheral smear  Sacral pressure ulcer -Wound care consult  Depression -Continue home Remeron  AKI (acute kidney injury) ([place]): Likely due to UTI -IV fluid: Normal saline 1 L -Follow-up by BMP      DVT ppx: On Xarelto Code Status: Full code per caregiver Family Communication: Yes, patient's friend/caregiver by phone Disposition Plan:  Anticipate discharge back to previous environment Consults called: None Admission status and Level of care: Med-Surg:  for obs  Status is: Observation  The patient remains OBS appropriate and will d/c before 2 midnights.  Dispo: The patient is from: Home  Anticipated d/c is to: Home              Patient currently is not medically stable to d/c.   Difficult to place patient No           Date of Service 11/17/2020    Ivor Costa [facility]   If 7PM-7AM, please contact night-coverage [email or web address] 11/17/2020, 6:19 PM

## 2020-11-17 NOTE — ED Notes (Signed)
Unable to draw second set of blood cultures. Called lab come come draw.

## 2020-11-17 NOTE — ED Notes (Signed)
CBG: 115 

## 2020-11-17 NOTE — ED Notes (Signed)
Informed RN bed assigned 

## 2020-11-17 NOTE — ED Notes (Signed)
EDP at bedside  

## 2020-11-17 NOTE — ED Triage Notes (Signed)
Pt to ED via [facility] from home alterede mental status, last normal was "last night" Hx CVA, previous deficits from stroke "not sure which side" per family Equal grip strength Responds to pain. Slurred speech from previous stroke VS normal, 112 CBG Eyes are closed, pt not speaking

## 2020-11-18 ENCOUNTER — Encounter: Payer: Self-pay | Admitting: Internal Medicine

## 2020-11-18 ENCOUNTER — Other Ambulatory Visit: Payer: Self-pay

## 2020-11-18 DIAGNOSIS — Z86718 Personal history of other venous thrombosis and embolism: Secondary | ICD-10-CM | POA: Diagnosis not present

## 2020-11-18 DIAGNOSIS — M341 CR(E)ST syndrome: Secondary | ICD-10-CM | POA: Diagnosis present

## 2020-11-18 DIAGNOSIS — Z931 Gastrostomy status: Secondary | ICD-10-CM | POA: Diagnosis not present

## 2020-11-18 DIAGNOSIS — F419 Anxiety disorder, unspecified: Secondary | ICD-10-CM | POA: Diagnosis present

## 2020-11-18 DIAGNOSIS — Z7952 Long term (current) use of systemic steroids: Secondary | ICD-10-CM | POA: Diagnosis not present

## 2020-11-18 DIAGNOSIS — N179 Acute kidney failure, unspecified: Secondary | ICD-10-CM | POA: Diagnosis present

## 2020-11-18 DIAGNOSIS — Z8249 Family history of ischemic heart disease and other diseases of the circulatory system: Secondary | ICD-10-CM | POA: Diagnosis not present

## 2020-11-18 DIAGNOSIS — E785 Hyperlipidemia, unspecified: Secondary | ICD-10-CM | POA: Diagnosis present

## 2020-11-18 DIAGNOSIS — L89322 Pressure ulcer of left buttock, stage 2: Secondary | ICD-10-CM | POA: Diagnosis present

## 2020-11-18 DIAGNOSIS — Z888 Allergy status to other drugs, medicaments and biological substances status: Secondary | ICD-10-CM | POA: Diagnosis not present

## 2020-11-18 DIAGNOSIS — Z90710 Acquired absence of both cervix and uterus: Secondary | ICD-10-CM | POA: Diagnosis not present

## 2020-11-18 DIAGNOSIS — M329 Systemic lupus erythematosus, unspecified: Secondary | ICD-10-CM

## 2020-11-18 DIAGNOSIS — D696 Thrombocytopenia, unspecified: Secondary | ICD-10-CM | POA: Diagnosis present

## 2020-11-18 DIAGNOSIS — M6282 Rhabdomyolysis: Secondary | ICD-10-CM | POA: Diagnosis not present

## 2020-11-18 DIAGNOSIS — G9341 Metabolic encephalopathy: Secondary | ICD-10-CM | POA: Diagnosis present

## 2020-11-18 DIAGNOSIS — N3000 Acute cystitis without hematuria: Secondary | ICD-10-CM | POA: Diagnosis not present

## 2020-11-18 DIAGNOSIS — E860 Dehydration: Secondary | ICD-10-CM | POA: Diagnosis present

## 2020-11-18 DIAGNOSIS — N390 Urinary tract infection, site not specified: Secondary | ICD-10-CM | POA: Diagnosis present

## 2020-11-18 DIAGNOSIS — C9000 Multiple myeloma not having achieved remission: Secondary | ICD-10-CM | POA: Diagnosis present

## 2020-11-18 DIAGNOSIS — Z7901 Long term (current) use of anticoagulants: Secondary | ICD-10-CM | POA: Diagnosis not present

## 2020-11-18 DIAGNOSIS — I10 Essential (primary) hypertension: Secondary | ICD-10-CM | POA: Diagnosis present

## 2020-11-18 DIAGNOSIS — Z87891 Personal history of nicotine dependence: Secondary | ICD-10-CM | POA: Diagnosis not present

## 2020-11-18 DIAGNOSIS — Z79899 Other long term (current) drug therapy: Secondary | ICD-10-CM | POA: Diagnosis not present

## 2020-11-18 DIAGNOSIS — I69351 Hemiplegia and hemiparesis following cerebral infarction affecting right dominant side: Secondary | ICD-10-CM | POA: Diagnosis not present

## 2020-11-18 DIAGNOSIS — F32A Depression, unspecified: Secondary | ICD-10-CM | POA: Diagnosis present

## 2020-11-18 DIAGNOSIS — Z20822 Contact with and (suspected) exposure to covid-19: Secondary | ICD-10-CM | POA: Diagnosis present

## 2020-11-18 LAB — BASIC METABOLIC PANEL
Anion gap: 8 (ref 5–15)
BUN: 17 mg/dL (ref 6–20)
CO2: 25 mmol/L (ref 22–32)
Calcium: 9.4 mg/dL (ref 8.9–10.3)
Chloride: 106 mmol/L (ref 98–111)
Creatinine, Ser: 0.76 mg/dL (ref 0.44–1.00)
GFR, Estimated: >60 mL/min (ref >60)
Glucose, Bld: 89 mg/dL (ref 70–99)
Potassium: 4 mmol/L (ref 3.5–5.1)
Sodium: 139 mmol/L (ref 135–145)

## 2020-11-18 LAB — HIV ANTIBODY (ROUTINE TESTING W REFLEX): HIV Screen 4th Generation wRfx: NONREACTIVE

## 2020-11-18 LAB — CBC
HCT: 37.1 % (ref 36.0–46.0)
Hemoglobin: 12.3 g/dL (ref 12.0–15.0)
MCH: 31.4 pg (ref 26.0–34.0)
MCHC: 33.2 g/dL (ref 30.0–36.0)
MCV: 94.6 fL (ref 80.0–100.0)
Platelets: 105 10*3/uL — ABNORMAL LOW (ref 150–400)
RBC: 3.92 MIL/uL (ref 3.87–5.11)
RDW: 13.2 % (ref 11.5–15.5)
WBC: 10.3 10*3/uL (ref 4.0–10.5)
nRBC: 0 % (ref 0.0–0.2)

## 2020-11-18 LAB — PATHOLOGIST SMEAR REVIEW

## 2020-11-18 MED ORDER — CEFTRIAXONE SODIUM 1 G IJ SOLR
1.0000 g | INTRAMUSCULAR | Status: AC
  Administered 2020-11-19: 15:00:00 1 g via INTRAVENOUS
  Filled 2020-11-18: qty 1

## 2020-11-18 MED ORDER — HYDRALAZINE HCL 10 MG PO TABS
10.0000 mg | ORAL_TABLET | Freq: Three times a day (TID) | ORAL | Status: DC
  Administered 2020-11-18 – 2020-11-20 (×5): 10 mg via ORAL
  Filled 2020-11-18 (×8): qty 1

## 2020-11-18 MED ORDER — ZINC OXIDE 40 % EX OINT
TOPICAL_OINTMENT | Freq: Two times a day (BID) | CUTANEOUS | Status: DC
  Filled 2020-11-18 (×2): qty 113

## 2020-11-18 NOTE — Consult Note (Signed)
[facility] Nurse Consult Note: Reason for Consult: Consult requested for sacrum.  There is no breakdown when sacrum was assessed.  However, inner gluteal cleft between buttocks is red, macerated, and painful.  Appearance is consistent with a partial thickness fissure related to moisture associated skin damage; this was present on admission. Affected area is approx 4X1X.1cm Dressing procedure/placement/frequency: Topical treatment orders ovided for bedside nurses to perform to promote healing and repel moisture as follows:  Apply Desitin to inner gluteal fold BID and PRN when turning or cleaning ovided for bedside nurses to perform to promote healing and repel moisture. Please re-consult if further assistance is needed.  Thank-you,  Julien Girt MSN, [place], [place], [place], [place]

## 2020-11-18 NOTE — Progress Notes (Signed)
PROGRESS NOTE  Valerie Bell FBP:[id number] DOB: 06/01/1966 DOA: 11/17/2020 PCP: [facility]  HPI/Recap of past 39 hours: 55 year old female with past medical history of hypertension, stroke, DVT on Xarelto, multiple myeloma, lupus and crest syndrome admitted on 6/1 after coming to the emergency room with confusion with her baseline being oriented x2, sometimes x3.  Patient found to have a large urinary tract infection along with acute kidney injury.  Started on IV fluids and antibiotics.  Sepsis ruled out.  This morning, renal function normalized and patient a bit more responsive, but still confused and easily somnolent.  Not yet back at baseline.  When she does awaken, she complains of hurting all over and not feeling well  Assessment/Plan: Principal Problem:   UTI (urinary tract infection) with secondary acute metabolic encephalopathy and acute kidney injury: Acute kidney injury resolved with IV fluids.  Continue antibiotics.  Follow-up on urine cultures.  Given elevated blood pressures, no further IV fluids Active Problems:   Acute metabolic encephalopathy   Benign essential HTN with history of pres syndrome: Blood pressures a bit more stable.  Continue home medications.   Lupus ([place])   Dyslipidemia   Spinal cord lesion (HCC)   Stroke (HCC)   Thrombocytopenia (HCC)   Sacral pressure ulcer: No evidence of sacral decubitus ulcer seen on examination, but she does note to have a wound on her gluteal fold.  Appreciate wound care help.)   Code Status: Full code  Family Communication: Updated friend by phone  Disposition Plan: Anticipate discharge tomorrow if continues to improve from a mentation standpoint   Consultants:  None  Procedures:  None  Antimicrobials:  IV Rocephin 6/1- 6/3  DVT prophylaxis: Lovenox  Level of care: Med-Surg   Objective: Vitals:   11/18/20 0817 11/18/20 1150  BP: (!) 151/85 137/79  Pulse: 67 81  Resp: 17 17  Temp: 97.6 F  (36.4 C) 97.8 F (36.6 C)  SpO2: 100% 100%    Intake/Output Summary (Last 24 hours) at 11/18/2020 1443 Last data filed at 11/18/2020 0543 Gross per 24 hour  Intake 100 ml  Output 300 ml  Net -200 ml   Filed Weights   11/17/20 2205  Weight: 59.1 kg   Body mass index is 22.36 kg/m.  Exam:   General: Somnolent, oriented x1 briefly  HEENT: Normocephalic and atraumatic, mucous membranes are dry  Cardiovascular: Regular rate and rhythm, S1-S2  Respiratory: Clear to auscultation bilaterally  Abdomen: Soft, nontender, nondistended, positive bowel sounds  Musculoskeletal: No clubbing or cyanosis or edema   Data Reviewed: CBC: Recent Labs  Lab 11/17/20 0907 11/18/20 0435  WBC 8.8 10.3  HGB 13.4 12.3  HCT 40.9 37.1  MCV 95.1 94.6  PLT 116* 824*   Basic Metabolic Panel: Recent Labs  Lab 11/17/20 0907 11/18/20 0435  NA 136 139  K 3.7 4.0  CL 99 106  CO2 26 25  GLUCOSE 89 89  BUN 28* 17  CREATININE 1.48* 0.76  CALCIUM 9.7 9.4   GFR: Estimated Creatinine Clearance: 69.4 mL/min (by C-G formula based on SCr of 0.76 mg/dL). Liver Function Tests: Recent Labs  Lab 11/17/20 0907  AST 24  ALT 17  ALKPHOS 64  BILITOT 0.6  PROT 10.2*  ALBUMIN 4.4   No results for input(s): LIPASE, AMYLASE in the last 168 hours. Recent Labs  Lab 11/17/20 1720  AMMONIA 27   Coagulation Profile: Recent Labs  Lab 11/17/20 0907  INR 1.6*   Cardiac Enzymes: No results  for input(s): CKTOTAL, CKMB, CKMBINDEX, TROPONINI in the last 168 hours. BNP (last 3 results) No results for input(s): PROBNP in the last 8760 hours. HbA1C: No results for input(s): HGBA1C in the last 72 hours. CBG: Recent Labs  Lab 11/17/20 0851  GLUCAP 115*   Lipid Profile: No results for input(s): CHOL, HDL, LDLCALC, TRIG, CHOLHDL, LDLDIRECT in the last 72 hours. Thyroid Function Tests: No results for input(s): TSH, T4TOTAL, FREET4, T3FREE, THYROIDAB in the last 72 hours. Anemia Panel: No results  for input(s): VITAMINB12, FOLATE, FERRITIN, TIBC, IRON, RETICCTPCT in the last 72 hours. Urine analysis:    Component Value Date/Time   COLORURINE YELLOW (A) 11/17/2020 1330   APPEARANCEUR HAZY (A) 11/17/2020 1330   APPEARANCEUR Clear 07/13/2013 1022   LABSPEC 1.025 11/17/2020 1330   LABSPEC 1.029 07/13/2013 1022   PHURINE 5.0 11/17/2020 1330   GLUCOSEU NEGATIVE 11/17/2020 1330   GLUCOSEU Negative 07/13/2013 1022   HGBUR MODERATE (A) 11/17/2020 1330   BILIRUBINUR NEGATIVE 11/17/2020 1330   BILIRUBINUR Negative 07/13/2013 1022   KETONESUR NEGATIVE 11/17/2020 1330   PROTEINUR 30 (A) 11/17/2020 1330   NITRITE NEGATIVE 11/17/2020 1330   LEUKOCYTESUR LARGE (A) 11/17/2020 1330   LEUKOCYTESUR Negative 07/13/2013 1022   Sepsis Labs: _0 (procalcitonin:4,lacticidven:4)  ) Recent Results (from the past 240 hour(s))  Culture, blood (Routine X 2) w Reflex to ID Panel     Status: None (Preliminary result)   Collection Time: 11/17/20  9:08 AM   Specimen: BLOOD  Result Value Ref Range Status   Specimen Description BLOOD LEFT ANTECUBITAL  Final   Special Requests   Final    BOTTLES DRAWN AEROBIC AND ANAEROBIC Blood Culture adequate volume   Culture   Final    NO GROWTH < 24 HOURS Performed at [facility], [address] [place], [place] [address]    Report Status PENDING  Incomplete  Resp Panel by RT-PCR (Flu A&B, Covid) Nasopharyngeal Swab     Status: None   Collection Time: 11/17/20 11:00 AM   Specimen: Nasopharyngeal Swab; Nasopharyngeal(NP) swabs in vial transport medium  Result Value Ref Range Status   SARS Coronavirus 2 by RT PCR NEGATIVE NEGATIVE Final    Comment: (NOTE) SARS-CoV-2 target nucleic acids are NOT DETECTED.  The SARS-CoV-2 RNA is generally detectable in upper respiratory specimens during the acute phase of infection. The lowest concentration of SARS-CoV-2 viral copies this assay can detect is 138 copies/mL. A negative result does not preclude  SARS-Cov-2 infection and should not be used as the sole basis for treatment or other patient management decisions. A negative result may occur with  improper specimen collection/handling, submission of specimen other than nasopharyngeal swab, presence of viral mutation(s) within the areas targeted by this assay, and inadequate number of viral copies(<138 copies/mL). A negative result must be combined with clinical observations, patient history, and epidemiological information. The expected result is Negative.  Fact Sheet for Patients:  EntrepreneurPulse.com.au  Fact Sheet for Healthcare Providers:  IncredibleEmployment.be  This test is no t yet approved or cleared by the [place] FDA and  has been authorized for detection and/or diagnosis of SARS-CoV-2 by FDA under an Emergency Use Authorization (EUA). This EUA will remain  in effect (meaning this test can be used) for the duration of the COVID-19 declaration under Section 564(b)(1) of the Act, 21 U.S.C.section 360bbb-3(b)(1), unless the authorization is terminated  or revoked sooner.       Influenza A by PCR NEGATIVE NEGATIVE Final   Influenza B by  PCR NEGATIVE NEGATIVE Final    Comment: (NOTE) The Xpert Xpress SARS-CoV-2/FLU/RSV plus assay is intended as an aid in the diagnosis of influenza from Nasopharyngeal swab specimens and should not be used as a sole basis for treatment. Nasal washings and aspirates are unacceptable for Xpert Xpress SARS-CoV-2/FLU/RSV testing.  Fact Sheet for Patients: EntrepreneurPulse.com.au  Fact Sheet for Healthcare Providers: IncredibleEmployment.be  This test is not yet approved or cleared by the [place] FDA and has been authorized for detection and/or diagnosis of SARS-CoV-2 by FDA under an Emergency Use Authorization (EUA). This EUA will remain in effect (meaning this test can be used) for the duration of  the COVID-19 declaration under Section 564(b)(1) of the Act, 21 U.S.C. section 360bbb-3(b)(1), unless the authorization is terminated or revoked.  Performed at [facility], [address] [place], [place] [address]   Culture, blood (Routine X 2) w Reflex to ID Panel     Status: None (Preliminary result)   Collection Time: 11/17/20  5:20 PM   Specimen: BLOOD  Result Value Ref Range Status   Specimen Description BLOOD BLOOD RIGHT HAND  Final   Special Requests   Final    BOTTLES DRAWN AEROBIC ONLY Blood Culture results may not be optimal due to an inadequate volume of blood received in culture bottles   Culture   Final    NO GROWTH < 12 HOURS Performed at [facility], [address], [place], [place] [address]    Report Status PENDING  Incomplete      Studies: No results found.  Scheduled Meds: . amLODipine  10 mg Oral Daily  . atorvastatin  80 mg Oral Daily  . baclofen  10 mg Oral TID  . calcium-vitamin D  1 tablet Oral Daily  . cholecalciferol  1,000 Units Oral Daily  . hydrALAZINE  10 mg Oral Q8H  . hydrocortisone  1 application Topical BID  . hydroxychloroquine  200 mg Oral Daily  . liver oil-zinc oxide   Topical BID  . mirtazapine  7.5 mg Oral QHS  . pantoprazole  20 mg Oral Daily  . polyethylene glycol  17 g Oral Daily  . predniSONE  10 mg Oral Q breakfast  . rivaroxaban  10 mg Oral Q supper    Continuous Infusions: . cefTRIAXone (ROCEPHIN)  IV       LOS: 0 days     Annita Brod, MD [facility]   11/18/2020, 2:43 PM

## 2020-11-19 DIAGNOSIS — M6282 Rhabdomyolysis: Secondary | ICD-10-CM

## 2020-11-19 LAB — CK: Total CK: 262 U/L — ABNORMAL HIGH (ref 38–234)

## 2020-11-19 MED ORDER — ATORVASTATIN CALCIUM 20 MG PO TABS
40.0000 mg | ORAL_TABLET | Freq: Every day | ORAL | Status: DC
  Administered 2020-11-20: 09:00:00 40 mg via ORAL
  Filled 2020-11-19: qty 2

## 2020-11-19 NOTE — Progress Notes (Signed)
PROGRESS NOTE  Valerie Bell FFM:[id number] DOB: [birth year] DOA: 11/17/2020 PCP: [facility]  HPI/Recap of past 66 hours: 55 year old female with past medical history of hypertension, stroke, DVT on Xarelto, multiple myeloma, lupus and crest syndrome admitted on 6/1 after coming to the emergency room with confusion with her baseline being oriented x2, sometimes x3.  Patient found to have a large urinary tract infection along with acute kidney injury.  Started on IV fluids and antibiotics.  Sepsis ruled out.  By 6/2,, renal function normalized and patient a bit more responsive, but still confused and easily somnolent.  Not yet back at baseline.  When she does awaken, she complains of hurting all over and not feeling well.  CK level checked on 6/3 and found to be elevated, consistent with rhabdomyolysis.    Assessment/Plan: Principal Problem:   UTI (urinary tract infection) with secondary acute metabolic encephalopathy and acute kidney injury: Acute kidney injury resolved with IV fluids.  Continue antibiotics.  Urine cultures positive for Klebsiella, sensitivities pending. Active Problems:   Acute metabolic encephalopathy   Benign essential HTN with history of pres syndrome: Blood pressures a bit more stable.  Continue home medications.   Lupus ([place])   Dyslipidemia   Spinal cord lesion (HCC)   Stroke (HCC)   Thrombocytopenia (HCC)   Sacral pressure ulcer: No evidence of sacral decubitus ulcer seen on examination, but she does note to have a wound on her gluteal fold.  Appreciate wound care help.)  Mild rhabdomyolysis: Patient on 80 mg statin.  Decrease to 40 mg daily   Code Status: Full code  Family Communication: Updated friend by phone  Disposition Plan: Anticipate discharge tomorrow if continues to improve from a mentation standpoint   Consultants:  None  Procedures:  None  Antimicrobials:  IV Rocephin 6/1- 6/3  DVT prophylaxis: Lovenox  Level of care:  Med-Surg   Objective: Vitals:   11/19/20 0900 11/19/20 1555  BP: (!) 156/91 137/87  Pulse: 87 85  Resp:  18  Temp: 97.6 F (36.4 C) 98.2 F (36.8 C)  SpO2: 100% 100%   No intake or output data in the 24 hours ending 11/19/20 1713 Filed Weights   11/17/20 2205  Weight: 59.1 kg   Body mass index is 22.36 kg/m.  Exam:   General: Still somewhat somnolent, oriented x1 briefly  HEENT: Normocephalic and atraumatic, mucous membranes are dry  Cardiovascular: Regular rate and rhythm, S1-S2  Respiratory: Clear to auscultation bilaterally  Abdomen: Soft, nontender, nondistended, positive bowel sounds  Musculoskeletal: No clubbing or cyanosis or edema   Data Reviewed: CBC: Recent Labs  Lab 11/17/20 0907 11/18/20 0435  WBC 8.8 10.3  HGB 13.4 12.3  HCT 40.9 37.1  MCV 95.1 94.6  PLT 116* 570*   Basic Metabolic Panel: Recent Labs  Lab 11/17/20 0907 11/18/20 0435  NA 136 139  K 3.7 4.0  CL 99 106  CO2 26 25  GLUCOSE 89 89  BUN 28* 17  CREATININE 1.48* 0.76  CALCIUM 9.7 9.4   GFR: Estimated Creatinine Clearance: 69.4 mL/min (by C-G formula based on SCr of 0.76 mg/dL). Liver Function Tests: Recent Labs  Lab 11/17/20 0907  AST 24  ALT 17  ALKPHOS 64  BILITOT 0.6  PROT 10.2*  ALBUMIN 4.4   No results for input(s): LIPASE, AMYLASE in the last 168 hours. Recent Labs  Lab 11/17/20 1720  AMMONIA 27   Coagulation Profile: Recent Labs  Lab 11/17/20 0907  INR 1.6*  Cardiac Enzymes: Recent Labs  Lab 11/19/20 1320  CKTOTAL 262*   BNP (last 3 results) No results for input(s): PROBNP in the last 8760 hours. HbA1C: No results for input(s): HGBA1C in the last 72 hours. CBG: Recent Labs  Lab 11/17/20 0851  GLUCAP 115*   Lipid Profile: No results for input(s): CHOL, HDL, LDLCALC, TRIG, CHOLHDL, LDLDIRECT in the last 72 hours. Thyroid Function Tests: No results for input(s): TSH, T4TOTAL, FREET4, T3FREE, THYROIDAB in the last 72 hours. Anemia  Panel: No results for input(s): VITAMINB12, FOLATE, FERRITIN, TIBC, IRON, RETICCTPCT in the last 72 hours. Urine analysis:    Component Value Date/Time   COLORURINE YELLOW (A) 11/17/2020 1330   APPEARANCEUR HAZY (A) 11/17/2020 1330   APPEARANCEUR Clear 07/13/2013 1022   LABSPEC 1.025 11/17/2020 1330   LABSPEC 1.029 07/13/2013 1022   PHURINE 5.0 11/17/2020 1330   GLUCOSEU NEGATIVE 11/17/2020 1330   GLUCOSEU Negative 07/13/2013 1022   HGBUR MODERATE (A) 11/17/2020 1330   BILIRUBINUR NEGATIVE 11/17/2020 1330   BILIRUBINUR Negative 07/13/2013 1022   KETONESUR NEGATIVE 11/17/2020 1330   PROTEINUR 30 (A) 11/17/2020 1330   NITRITE NEGATIVE 11/17/2020 1330   LEUKOCYTESUR LARGE (A) 11/17/2020 1330   LEUKOCYTESUR Negative 07/13/2013 1022   Sepsis Labs: _0 (procalcitonin:4,lacticidven:4)  ) Recent Results (from the past 240 hour(s))  Culture, blood (Routine X 2) w Reflex to ID Panel     Status: None (Preliminary result)   Collection Time: 11/17/20  9:08 AM   Specimen: BLOOD  Result Value Ref Range Status   Specimen Description BLOOD LEFT ANTECUBITAL  Final   Special Requests   Final    BOTTLES DRAWN AEROBIC AND ANAEROBIC Blood Culture adequate volume   Culture   Final    NO GROWTH 2 DAYS Performed at [facility], [address] [place], [place] [address]    Report Status PENDING  Incomplete  Resp Panel by RT-PCR (Flu A&B, Covid) Nasopharyngeal Swab     Status: None   Collection Time: 11/17/20 11:00 AM   Specimen: Nasopharyngeal Swab; Nasopharyngeal(NP) swabs in vial transport medium  Result Value Ref Range Status   SARS Coronavirus 2 by RT PCR NEGATIVE NEGATIVE Final    Comment: (NOTE) SARS-CoV-2 target nucleic acids are NOT DETECTED.  The SARS-CoV-2 RNA is generally detectable in upper respiratory specimens during the acute phase of infection. The lowest concentration of SARS-CoV-2 viral copies this assay can detect is 138 copies/mL. A negative result does  not preclude SARS-Cov-2 infection and should not be used as the sole basis for treatment or other patient management decisions. A negative result may occur with  improper specimen collection/handling, submission of specimen other than nasopharyngeal swab, presence of viral mutation(s) within the areas targeted by this assay, and inadequate number of viral copies(<138 copies/mL). A negative result must be combined with clinical observations, patient history, and epidemiological information. The expected result is Negative.  Fact Sheet for Patients:  EntrepreneurPulse.com.au  Fact Sheet for Healthcare Providers:  IncredibleEmployment.be  This test is no t yet approved or cleared by the [place] FDA and  has been authorized for detection and/or diagnosis of SARS-CoV-2 by FDA under an Emergency Use Authorization (EUA). This EUA will remain  in effect (meaning this test can be used) for the duration of the COVID-19 declaration under Section 564(b)(1) of the Act, 21 U.S.C.section 360bbb-3(b)(1), unless the authorization is terminated  or revoked sooner.       Influenza A by PCR NEGATIVE NEGATIVE Final   Influenza B  by PCR NEGATIVE NEGATIVE Final    Comment: (NOTE) The Xpert Xpress SARS-CoV-2/FLU/RSV plus assay is intended as an aid in the diagnosis of influenza from Nasopharyngeal swab specimens and should not be used as a sole basis for treatment. Nasal washings and aspirates are unacceptable for Xpert Xpress SARS-CoV-2/FLU/RSV testing.  Fact Sheet for Patients: EntrepreneurPulse.com.au  Fact Sheet for Healthcare Providers: IncredibleEmployment.be  This test is not yet approved or cleared by the [place] FDA and has been authorized for detection and/or diagnosis of SARS-CoV-2 by FDA under an Emergency Use Authorization (EUA). This EUA will remain in effect (meaning this test can be used) for the  duration of the COVID-19 declaration under Section 564(b)(1) of the Act, 21 U.S.C. section 360bbb-3(b)(1), unless the authorization is terminated or revoked.  Performed at [facility], [address], [place], [place] [address]   Urine Culture     Status: Abnormal (Preliminary result)   Collection Time: 11/17/20  1:30 PM   Specimen: Urine, Random  Result Value Ref Range Status   Specimen Description   Final    URINE, RANDOM Performed at [facility], [address], [place], [place] [address]    Special Requests   Final    NONE Performed at [facility], [address], [place], [place] [address]    Culture (A)  Final    >=100,000 COLONIES/mL KLEBSIELLA PNEUMONIAE SUSCEPTIBILITIES TO FOLLOW Performed at [facility], [place] [address] [place], [place] [address]    Report Status PENDING  Incomplete  Culture, blood (Routine X 2) w Reflex to ID Panel     Status: None (Preliminary result)   Collection Time: 11/17/20  5:20 PM   Specimen: BLOOD  Result Value Ref Range Status   Specimen Description BLOOD BLOOD RIGHT HAND  Final   Special Requests   Final    BOTTLES DRAWN AEROBIC ONLY Blood Culture results may not be optimal due to an inadequate volume of blood received in culture bottles   Culture   Final    NO GROWTH 2 DAYS Performed at [facility], [address], [place], [place] [address]    Report Status PENDING  Incomplete      Studies: No results found.  Scheduled Meds: . amLODipine  10 mg Oral Daily  . [START ON 11/20/2020] atorvastatin  40 mg Oral Daily  . baclofen  10 mg Oral TID  . calcium-vitamin D  1 tablet Oral Daily  . cholecalciferol  1,000 Units Oral Daily  . hydrALAZINE  10 mg Oral Q8H  . hydrocortisone  1 application Topical BID  . hydroxychloroquine  200 mg Oral Daily  . liver oil-zinc oxide   Topical BID  . mirtazapine  7.5 mg Oral QHS  . pantoprazole  20 mg Oral Daily  . polyethylene glycol  17 g Oral  Daily  . predniSONE  10 mg Oral Q breakfast  . rivaroxaban  10 mg Oral Q supper    Continuous Infusions:    LOS: 1 day     Annita Brod, MD [facility]   11/19/2020, 5:13 PM

## 2020-11-19 NOTE — Progress Notes (Signed)
PT Cancellation Note  Patient Details Name: Valerie Bell MRN: [id number] DOB: 08/09/1965   Cancelled Treatment:    Reason Eval/Treat Not Completed: Other (comment).  PT consult received.  Chart reviewed.  Pt resting in bed upon PT arrival.  Pt initially talking with therapist but refusing any mobility--pt then closed her eyes and would then only occasionally talk to therapist.  Unable to encourage pt to participate in therapy session: nurse notified.  Will re-attempt PT evaluation at a later date/time.  Leitha Bleak, PT 11/19/20, 12:01 PM

## 2020-11-20 LAB — URINE CULTURE: Culture: >=100000 — AB

## 2020-11-20 MED ORDER — CIPROFLOXACIN IN D5W 400 MG/200ML IV SOLN
400.0000 mg | INTRAVENOUS | Status: AC
  Administered 2020-11-20: 11:00:00 400 mg via INTRAVENOUS
  Filled 2020-11-20: qty 200

## 2020-11-20 MED ORDER — ATORVASTATIN CALCIUM 80 MG PO TABS: 40.0000 mg | ORAL_TABLET | Freq: Every day | ORAL | 2 refills | Status: AC

## 2020-11-20 MED ORDER — LEVETIRACETAM 750 MG PO TABS
1500.0000 mg | ORAL_TABLET | Freq: Two times a day (BID) | ORAL | Status: DC
  Administered 2020-11-20: 12:00:00 1500 mg via ORAL
  Filled 2020-11-20 (×2): qty 2

## 2020-11-20 MED ORDER — CIPROFLOXACIN HCL 500 MG PO TABS: 500.0000 mg | ORAL_TABLET | Freq: Two times a day (BID) | ORAL | 0 refills | Status: AC

## 2020-11-20 NOTE — Discharge Summary (Signed)
Discharge Summary  Valerie Bell [facility]:[id number] DOB: 1965/09/28  PCP: [facility]  Admit date: 11/17/2020 Discharge date: 11/20/2020  Time spent: 25 minutes  Recommendations for Outpatient Follow-up:  1. New medication: Cipro 500 mg p.o. twice daily x5 days.  Initial dose given as IV in hospital prior to discharge. 2. Medication change: Lipitor decreased from 80 mg to 40 mg daily  Discharge Diagnoses:  Active Hospital Problems   Diagnosis Date Noted  . UTI (urinary tract infection) 11/17/2020  . 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 Stroke ([place])   . Thrombocytopenia ([place])   . Sacral pressure ulcer   . Depression   . AKI (acute kidney injury) ([place])   . Dyslipidemia 07/08/2019  . Acute metabolic encephalopathy 66/29/4765  . Benign essential HTN 08/27/2018  . Lupus ([place]) 08/27/2018    Resolved Hospital Problems  No resolved problems to display.    Discharge Condition: Improved, being discharged home  Diet recommendation: Low-sodium  Vitals:   11/20/20 0449 11/20/20 0819  BP: (!) 180/91 (!) 162/95  Pulse: 78 78  Resp: 16 16  Temp: 98 F (36.7 C) 97.7 F (36.5 C)  SpO2: 100% 100%    History of present illness:  55 year old female with past medical history of hypertension, stroke, DVT on Xarelto, multiple myeloma, lupus and crest syndrome admitted on 6/1 after coming to the emergency room with confusion with her baseline being oriented x2, sometimes x3.  Patient found to have a large urinary tract infection along with acute kidney injury.  Started on IV fluids and antibiotics.  Sepsis ruled out.   Hospital Course:  Principal Problem:   UTI (urinary tract infection) with secondary acute metabolic encephalopathy and acute kidney injury: Acute kidney injury resolved with IV fluids.    Initially received 3 days of IV Rocephin.  However urine cultures came back positive for Klebsiella with new resistance to most antibiotics including Rocephin, but sensitive to Cipro.   Patient given a dose of IV Cipro and given otherwise stable, discharged on p.o. Cipro for 5 days.  Active Problems:   Acute metabolic encephalopathy: Felt to be secondary to urinary tract infection and dehydration.  With fluids and antibiotics, improved.    Benign essential HTN with history of pres syndrome: Blood pressures a bit more stable.  Continue home medications.  At times elevated.  Patient received as needed IV hydralazine.    Lupus ([place]): Stable during his hospitalization   Dyslipidemia   Spinal cord lesion ([facility])   Stroke ([place])   Thrombocytopenia ([place]): Stable during his hospitalization.  As low as 105.    Skin breakdown: No evidence of sacral decubitus ulcer.  Intergluteal cleft between buttocks is red macerated and painful, consistent with a partial-thickness fissure related to moisture associated skin damage, present on admission.  Appreciate wound care help.  Desitin to intergluteal fold twice daily and as needed when turning or cleaning  Mild rhabdomyolysis: Patient on 80 mg statin.  Decrease to 40 mg daily  Procedures:  None  Consultations:  None  Discharge Exam: BP (!) 162/95 (BP Location: Left Arm)   Pulse 78   Temp 97.7 F (36.5 C) (Oral)   Resp 16   Wt 59.1 kg   LMP 12/29/2001   SpO2 100%   BMI 22.36 kg/m   General: Alert and oriented x2, no acute distress Cardiovascular: Regular rate and rhythm, S1-S2 Respiratory: Clear to auscultation bilaterally  Discharge Instructions You were cared for by a hospitalist during your hospital stay.  If you have any questions about your discharge medications or the care you received while you were in the hospital after you are discharged, you can call the unit and asked to speak with the hospitalist on call if the hospitalist that took care of you is not available. Once you are discharged, your primary care physician will handle any further medical issues. Please note that NO REFILLS for any discharge medications will be  authorized once you are discharged, as it is imperative that you return to your primary care physician (or establish a relationship with a primary care physician if you do not have one) for your aftercare needs so that they can reassess your need for medications and monitor your lab values.   Allergies as of 11/20/2020      Reactions   Ace Inhibitors Other (See Comments)   Angioedema of face      Medication List    TAKE these medications   atorvastatin 80 MG tablet Commonly known as: LIPITOR Take 0.5 tablets (40 mg total) by mouth daily. What changed: how much to take   baclofen 10 MG tablet Commonly known as: LIORESAL Place 5 mg into feeding tube 3 (three) times daily.   ciprofloxacin 500 MG tablet Commonly known as: Cipro Take 1 tablet (500 mg total) by mouth 2 (two) times daily for 5 days.   hydrALAZINE 10 MG tablet Commonly known as: APRESOLINE Take 10 mg by mouth 3 (three) times daily.   hydrocortisone 1 % ointment Apply 1 application topically 2 (two) times daily as needed for itching.   levETIRAcetam 750 MG tablet Commonly known as: KEPPRA Take 1,500 mg by mouth 2 (two) times daily.   mirtazapine 7.5 MG tablet Commonly known as: REMERON Take 7.5 mg by mouth at bedtime.   pantoprazole 20 MG tablet Commonly known as: PROTONIX Take 20 mg by mouth daily.   polyethylene glycol powder 17 GM/SCOOP powder Commonly known as: GLYCOLAX/MIRALAX Take 17 g by mouth daily.   rivaroxaban 10 MG Tabs tablet Commonly known as: XARELTO Take 10 mg by mouth every evening.      Allergies  Allergen Reactions  . Ace Inhibitors Other (See Comments)    Angioedema of face      The results of significant diagnostics from this hospitalization (including imaging, microbiology, ancillary and laboratory) are listed below for reference.    Significant Diagnostic Studies: DG Chest 2 View  Result Date: 11/07/2020 CLINICAL DATA:  Shortness of breath EXAM: CHEST - 2 VIEW COMPARISON:   None. FINDINGS: The heart size and mediastinal contours are within normal limits. Both lungs are clear. The visualized skeletal structures are unremarkable. IMPRESSION: No active cardiopulmonary disease. Electronically Signed   By: Ulyses Jarred M.D.   On: 11/07/2020 22:02   DG Tibia/Fibula Left  Result Date: 11/07/2020 CLINICAL DATA:  Leg swelling EXAM: LEFT TIBIA AND FIBULA - 2 VIEW COMPARISON:  None. FINDINGS: There is no evidence of fracture or other focal bone lesions. Soft tissues are unremarkable. IMPRESSION: Negative. Electronically Signed   By: Ulyses Jarred M.D.   On: 11/07/2020 22:03   DG Ankle Complete Left  Result Date: 11/07/2020 CLINICAL DATA:  Shortness of breath and leg swelling EXAM: LEFT ANKLE COMPLETE - 3+ VIEW COMPARISON:  None. FINDINGS: There is no evidence of fracture, dislocation, or joint effusion. There is no evidence of arthropathy or other focal bone abnormality. Soft tissues are unremarkable. IMPRESSION: Negative. Electronically Signed   By: Ulyses Jarred M.D.   On: 11/07/2020 22:03  CT Head Wo Contrast  Result Date: 11/17/2020 CLINICAL DATA:  Mental status change EXAM: CT HEAD WITHOUT CONTRAST TECHNIQUE: Contiguous axial images were obtained from the base of the skull through the vertex without intravenous contrast. COMPARISON:  CT head 09/29/2020 FINDINGS: Brain: Generalized atrophy unchanged. Negative for hydrocephalus. Mild white matter hypodensity bilaterally unchanged. Small chronic infarct right occipital lobe unchanged. 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 Frontal sinuses otherwise well aerated. Negative orbit Other: None IMPRESSION: No acute abnormality. No change from the recent CT. Electronically Signed   By: Franchot Gallo M.D.   On: 11/17/2020 13:17   US Venous Img Lower Bilateral  Result Date: 11/07/2020 CLINICAL DATA:  Increased right  leg swelling. EXAM: BILATERAL LOWER EXTREMITY VENOUS DOPPLER ULTRASOUND TECHNIQUE: Gray-scale sonography with compression, as well as color and duplex ultrasound, were performed to evaluate the deep venous system(s) from the level of the common femoral vein through the popliteal and proximal calf veins. COMPARISON:  Nov 01, 2020 FINDINGS: VENOUS Abnormal compressibility of the BILATERAL common femoral, superficial femoral, and popliteal veins, as well as the visualized BILATERAL posterior tibial veins and RIGHT peroneal vein (the LEFT peroneal vein is normal in appearance). Visualized portions of the BILATERAL profunda femoral veins and BILATERAL great saphenous vein are also abnormal. Associated nonocclusive filling defects consistent with chronic DVT are seen on grayscale and color Doppler imaging. Doppler waveforms show normal direction of venous flow, normal respiratory plasticity and response to augmentation. OTHER Subcutaneous edema is present throughout the right calf. This is seen on the prior study and is increased in severity on the current exam. Limitations: none IMPRESSION: 1. Stable, chronic nonocclusive DVT involving nearly the entirety of the BILATERAL lower extremity veins. 2. Worsening right calf edema which may represent sequelae associated with underlying cellulitis. Electronically Signed   By: Virgina Norfolk M.D.   On: 11/07/2020 23:45   US Venous Img Lower Unilateral Right  Result Date: 11/01/2020 CLINICAL DATA:  Right lower extremity pain and edema for the past 2 days. History of previous DVT, currently on anticoagulation. Evaluate for acute or chronic DVT. EXAM: RIGHT LOWER EXTREMITY VENOUS DOPPLER ULTRASOUND TECHNIQUE: Gray-scale sonography with graded compression, as well as color Doppler and duplex ultrasound were performed to evaluate the lower extremity deep venous systems from the level of the common femoral vein and including the common femoral, femoral, profunda femoral,  popliteal and calf veins including the posterior tibial, peroneal and gastrocnemius veins when visible. The superficial great saphenous vein was also interrogated. Spectral Doppler was utilized to evaluate flow at rest and with distal augmentation maneuvers in the common femoral, femoral and popliteal veins. COMPARISON:  Right lower extremity venous Doppler ultrasound-09/06/2018 (positive for extensive predominantly occlusive DVT extending from the right common femoral vein through the right tibial veins, similar to the 2019 examination) FINDINGS: Contralateral Common Femoral Vein: There is hypoechoic nonocclusive wall thickening/DVT involving the left common femoral vein, new compared to the 08/2018 examination. There is grossly unchanged mixed echogenic nonocclusive DVT involving the right common femoral vein (image 11 and 12) extending to involve the saphenofemoral junction (image 17). The right deep femoral vein appears patent where imaged (image 21). There is grossly unchanged mixed echogenic near occlusive DVT involving the proximal (image 25), mid (image 30) and distal (image 35) aspects of the right femoral vein image. A duplicated segment of the right mid femoral vein appears patent (image 32). There is unchanged  hypoechoic nonocclusive thrombus involving the right popliteal vein (image 43), extending to involve the right posterior tibial (image 47 and peroneal veins (image 54), unchanged. Other Findings: There is a minimal amount of subcutaneous edema at the level of the right calf (image 45) IMPRESSION: 1. Age-indeterminate though presumably chronic nonocclusive wall thickening/DVT involving the contralateral LEFT common femoral vein, new compared to the 08/2018 examination and while favored to be chronic in etiology, an acute on chronic process is not excluded. Clinical correlation is advised. 2. Similar appearing near occlusive DVT involving near the entirety of the RIGHT lower extremity venous system  extending from the right common femoral vein through the imaged tibial veins, similar to the 2019 examination. Electronically Signed   By: Sandi Mariscal M.D.   On: 11/01/2020 10:37   DG Chest Portable 1 View  Result Date: 11/17/2020 CLINICAL DATA:  Altered mental status. EXAM: PORTABLE CHEST 1 VIEW COMPARISON:  CT 09/12/2020.  Chest x-ray 11/07/2020. FINDINGS: Patient is rotated to the right. Mediastinum hilar structures stable. Tortuous thoracic aorta again noted. Low lung volumes with mild bibasilar atelectasis/infiltrates. No pleural effusion or pneumothorax. IMPRESSION: Lung volumes with mild bibasilar atelectasis/infiltrates. No pleural effusion or pneumothorax. Electronically Signed   By: Marcello Moores  Register   On: 11/17/2020 09:33    Microbiology: Recent Results (from the past 240 hour(s))  Culture, blood (Routine X 2) w Reflex to ID Panel     Status: None (Preliminary result)   Collection Time: 11/17/20  9:08 AM   Specimen: BLOOD  Result Value Ref Range Status   Specimen Description BLOOD LEFT ANTECUBITAL  Final   Special Requests   Final    BOTTLES DRAWN AEROBIC AND ANAEROBIC Blood Culture adequate volume   Culture   Final    NO GROWTH 3 DAYS Performed at [facility] : [facility], [address] [place], [place] [address]    Report Status PENDING  Incomplete  Resp Panel by RT-PCR (Flu A&B, Covid) Nasopharyngeal Swab     Status: None   Collection Time: 11/17/20 11:00 AM   Specimen: Nasopharyngeal Swab; Nasopharyngeal(NP) swabs in vial transport medium  Result Value Ref Range Status   SARS Coronavirus 2 by RT PCR NEGATIVE NEGATIVE Final    Comment: (NOTE) SARS-CoV-2 target nucleic acids are NOT DETECTED.  The SARS-CoV-2 RNA is generally detectable in upper respiratory specimens during the acute phase of infection. The lowest concentration of SARS-CoV-2 viral copies this assay can detect is 138 copies/mL. A negative result does not preclude SARS-Cov-2 infection and should not be used  as the sole basis for treatment or other patient management decisions. A negative result may occur with  improper specimen collection/handling, submission of specimen other than nasopharyngeal swab, presence of viral mutation(s) within the areas targeted by this assay, and inadequate number of viral copies(<138 copies/mL). A negative result must be combined with clinical observations, patient history, and epidemiological information. The expected result is Negative.  Fact Sheet for Patients:  EntrepreneurPulse.com.au  Fact Sheet for Healthcare Providers:  IncredibleEmployment.be  This test is no t yet approved or cleared by the [place] FDA and  has been authorized for detection and/or diagnosis of SARS-CoV-2 by FDA under an Emergency Use Authorization (EUA). This EUA will remain  in effect (meaning this test can be used) for the duration of the COVID-19 declaration under Section 564(b)(1) of the Act, 21 U.S.C.section 360bbb-3(b)(1), unless the authorization is terminated  or revoked sooner.       Influenza A by PCR NEGATIVE NEGATIVE Final  Influenza B by PCR NEGATIVE NEGATIVE Final    Comment: (NOTE) The Xpert Xpress SARS-CoV-2/FLU/RSV plus assay is intended as an aid in the diagnosis of influenza from Nasopharyngeal swab specimens and should not be used as a sole basis for treatment. Nasal washings and aspirates are unacceptable for Xpert Xpress SARS-CoV-2/FLU/RSV testing.  Fact Sheet for Patients: EntrepreneurPulse.com.au  Fact Sheet for Healthcare Providers: IncredibleEmployment.be  This test is not yet approved or cleared by the [place] FDA and has been authorized for detection and/or diagnosis of SARS-CoV-2 by FDA under an Emergency Use Authorization (EUA). This EUA will remain in effect (meaning this test can be used) for the duration of the COVID-19 declaration under Section 564(b)(1)  of the Act, 21 U.S.C. section 360bbb-3(b)(1), unless the authorization is terminated or revoked.  Performed at [facility], [address] [place], [place] [address]   Urine Culture     Status: Abnormal   Collection Time: 11/17/20  1:30 PM   Specimen: Urine, Random  Result Value Ref Range Status   Specimen Description   Final    URINE, RANDOM Performed at [facility], [address], [place], [place] [address]    Special Requests   Final    NONE Performed at [facility], [address] [place], [place] [address]    Culture (A)  Final    >=100,000 COLONIES/mL KLEBSIELLA PNEUMONIAE Confirmed Extended Spectrum Beta-Lactamase Producer (ESBL).  In bloodstream infections from ESBL organisms, carbapenems are preferred over piperacillin/tazobactam. They are shown to have a lower risk of mortality.    Report Status 11/20/2020 FINAL  Final   Organism ID, Bacteria KLEBSIELLA PNEUMONIAE (A)  Final      Susceptibility   Klebsiella pneumoniae - MIC*    AMPICILLIN >=32 RESISTANT Resistant     CEFAZOLIN >=64 RESISTANT Resistant     CEFEPIME >=32 RESISTANT Resistant     CEFTRIAXONE >=64 RESISTANT Resistant     CIPROFLOXACIN <=0.25 SENSITIVE Sensitive     GENTAMICIN <=1 SENSITIVE Sensitive     IMIPENEM <=0.25 SENSITIVE Sensitive     NITROFURANTOIN 64 INTERMEDIATE Intermediate     TRIMETH/SULFA >=320 RESISTANT Resistant     AMPICILLIN/SULBACTAM 16 INTERMEDIATE Intermediate     PIP/TAZO <=4 SENSITIVE Sensitive     * >=100,000 COLONIES/mL KLEBSIELLA PNEUMONIAE  Culture, blood (Routine X 2) w Reflex to ID Panel     Status: None (Preliminary result)   Collection Time: 11/17/20  5:20 PM   Specimen: BLOOD  Result Value Ref Range Status   Specimen Description BLOOD BLOOD RIGHT HAND  Final   Special Requests   Final    BOTTLES DRAWN AEROBIC ONLY Blood Culture results may not be optimal due to an inadequate volume of blood received in culture bottles   Culture    Final    NO GROWTH 3 DAYS Performed at [facility], [address] [place], [place] [address]    Report Status PENDING  Incomplete     Labs: Basic Metabolic Panel: Recent Labs  Lab 11/17/20 0907 11/18/20 0435  NA 136 139  K 3.7 4.0  CL 99 106  CO2 26 25  GLUCOSE 89 89  BUN 28* 17  CREATININE 1.48* 0.76  CALCIUM 9.7 9.4   Liver Function Tests: Recent Labs  Lab 11/17/20 0907  AST 24  ALT 17  ALKPHOS 64  BILITOT 0.6  PROT 10.2*  ALBUMIN 4.4   No results for input(s): LIPASE, AMYLASE in the last 168 hours. Recent Labs  Lab 11/17/20 1720  AMMONIA 27   CBC: Recent Labs  Lab 11/17/20 0907 11/18/20 0435  WBC 8.8 10.3  HGB 13.4 12.3  HCT 40.9 37.1  MCV 95.1 94.6  PLT 116* 105*   Cardiac Enzymes: Recent Labs  Lab 11/19/20 1320  CKTOTAL 262*   BNP: BNP (last 3 results) Recent Labs    09/12/20 1257 11/07/20 2014  BNP 36.2 90.1    ProBNP (last 3 results) No results for input(s): PROBNP in the last 8760 hours.  CBG: Recent Labs  Lab 11/17/20 0851  GLUCAP 115*       Signed:  Annita Brod, MD [facility] 11/20/2020, 9:44 AM

## 2020-11-20 NOTE — TOC Transition Note (Addendum)
Transition of Care [facility]) - CM/SW Discharge Note   Patient Details  Name: SKARLET LYONS MRN: [id number] Date of Birth: [birth year]  [facility] CM/SW Contact:  Alberteen Sam, LCSW Phone Number: 11/20/2020, 2:19 PM   Clinical Narrative:     Update: [name] with [facility] called back reports patient is active with them for speech, CSW asked to add PT and RN. CSW notes no orders in chart, has reached out to MD requesting home health orders for PT and RN.   Patient to discharge today with recommended home health services, contact [name] who is patient's roommate reports being agreeable for home health referrals being sent out for agency acceptance.   [name] states patient lives with her and one other roommate who provide 24/7 supervision for patient.   Reports patient needs a new wheel chair. CSW has called Jasmine with [employer] for wheel chair to be delivered to home, as patient already discharged.   CSW has reached out to Radene Knee and [facility] for home health referral pending response/acceptance at this time.   Per [facility], patient may be active with [facility]. CSW has reached out to [place] with [facility] at [phone], pending response.   Final next level of care: Home w Home Health Services Barriers to Discharge: No Barriers Identified   Patient Goals and CMS Choice   CMS Medicare.gov Compare Post Acute Care list provided to:: Patient Represenative (must comment) (roomate jennifer) Choice offered to / list presented to : [place] / [place]  Discharge Placement                  Name of family member notified: [name] Patient and family notified of of transfer: 11/20/20  Discharge Plan and Services                DME Arranged: Youth worker wheelchair with seat cushion DME Agency: [facility] Date DME Agency Contacted: 11/20/20 Time DME Agency Contacted: [phone] Representative spoke with at DME Agency: [place]:  (TBD)           Social Determinants of Health (SDOH) Interventions     Readmission Risk Interventions No flowsheet data found.

## 2020-11-20 NOTE — Evaluation (Signed)
Physical Therapy Evaluation Patient Details Name: Valerie Bell MRN: [id number] DOB: April 03, 1966 Today's Date: 11/20/2020   History of Present Illness  Pt admitted for UTI with complaints of AMS. Of note, also + bed bugs. Has since been decontaiminated. History includes CVA with R side deficits, HTN, GERD, Depression, multiple myeloma, and lupus.  Clinical Impression  Pt is a pleasant 55 year old female who was admitted for UTI. Pt performs bed mobility with mod assist and unable to perform transfers/ambulation at this time due to weakness. Pt demonstrates deficits with strength/mobility/cognition. Slightly confused to date and situation. Doesn't appear to be at baseline level. Would benefit from skilled PT to address above deficits and promote optimal return to PLOF; recommend transition to STR upon discharge from acute hospitalization.     Follow Up Recommendations SNF    Equipment Recommendations  Wheelchair (measurements PT)    Recommendations for Other Services       Precautions / Restrictions Precautions Precautions: Fall Restrictions Weight Bearing Restrictions: No      Mobility  Bed Mobility Overal bed mobility: Needs Assistance Bed Mobility: Supine to Sit     Supine to sit: Mod assist     General bed mobility comments: needs assist for B LEs and trunkal assist. Able to use L UE for assist. Once seated, able to sit with supervision and upright posture.    Transfers Overall transfer level: Needs assistance Equipment used: Rolling walker (2 wheeled) Transfers: Sit to/from Stand Sit to Stand: Max assist         General transfer comment: 2 attempts for standing, however unable to clear buttocks off bed. Was able to perform lateral scoots towards [facility]: Lambert Rhonda W with mod assist.  Ambulation/Gait             General Gait Details: unable  Stairs            Wheelchair Mobility    Modified Rankin (Stroke Patients Only)       Balance Overall balance  assessment: Needs assistance Sitting-balance support: Bilateral upper extremity supported Sitting balance-Leahy Scale: Good                                       Pertinent Vitals/Pain Pain Assessment: No/denies pain    Home Living Family/patient expects to be discharged to:: Private residence Living Arrangements: Parent (mom) Available Help at Discharge: Available 24 hours/day Type of Home: House Home Access: Ramped entrance     Home Layout: One level Home Equipment: Walker - 4 wheels Additional Comments: pt is poor historian. Sent secure chat to CSW as this history is different from EMR. Unsure of accuracy    Prior Function Level of Independence: Needs assistance         Comments: per patient, mom helps with all ADLs. Reports multiple recent falls. Uses rollator at all times and is limited household ambulator. She was unable to verbalize the last time she was walking     Hand Dominance        Extremity/Trunk Assessment   Upper Extremity Assessment Upper Extremity Assessment: Generalized weakness (Kept R UE in rested guarded elbow/wrist flexed postiion (able to extend for functional use) grossly 3-/5; L UE grossly 4/5)    Lower Extremity Assessment Lower Extremity Assessment: Generalized weakness (B LE grossly 2/5; B foot PF unable to achieve netural positioning.)       Communication   Communication: No difficulties  Cognition  Arousal/Alertness: Awake/alert Behavior During Therapy: Flat affect Overall Cognitive Status: Impaired/Different from baseline                                 General Comments: oriented x 2. Delayed response. Unsure of processing delay.      General Comments      Exercises Other Exercises Other Exercises: supine/seated ther-ex performed on B LE including hip abd/add, SLRs, assisted DF, and LAQ. 10 reps with min assist   Assessment/Plan    PT Assessment Patient needs continued PT services  PT Problem  List Decreased strength;Decreased activity tolerance;Decreased balance;Decreased mobility       PT Treatment Interventions Gait training;DME instruction;Therapeutic activities;Therapeutic exercise    PT Goals (Current goals can be found in the Care Plan section)  Acute Rehab PT Goals Patient Stated Goal: to go home PT Goal Formulation: With patient Time For Goal Achievement: 12/04/20 Potential to Achieve Goals: Fair    Frequency Min 2X/week   Barriers to discharge        Co-evaluation               AM-PAC PT "6 Clicks" Mobility  Outcome Measure Help needed turning from your back to your side while in a flat bed without using bedrails?: A Little Help needed moving from lying on your back to sitting on the side of a flat bed without using bedrails?: A Little Help needed moving to and from a bed to a chair (including a wheelchair)?: A Lot Help needed standing up from a chair using your arms (e.g., wheelchair or bedside chair)?: Total Help needed to walk in hospital room?: Total Help needed climbing 3-5 steps with a railing? : Total 6 Click Score: 11    End of Session Equipment Utilized During Treatment: Gait belt Activity Tolerance: Patient tolerated treatment well Patient left: in bed;with bed alarm set Nurse Communication: Mobility status PT Visit Diagnosis: Muscle weakness (generalized) (M62.81);Difficulty in walking, not elsewhere classified (R26.2)    Time: 2641-5830 PT Time Calculation (min) (ACUTE ONLY): 28 min   Charges:   PT Evaluation $PT Eval Low Complexity: 1 Low PT Treatments $Therapeutic Exercise: 8-22 mins        Greggory Stallion, PT, DPT [phone]   Nathifa Ritthaler 11/20/2020, 12:16 PM

## 2020-11-21 ENCOUNTER — Encounter (INDEPENDENT_AMBULATORY_CARE_PROVIDER_SITE_OTHER): Payer: Self-pay | Admitting: Nurse Practitioner

## 2020-11-22 LAB — CULTURE, BLOOD (ROUTINE X 2)
Culture: NO GROWTH
Culture: NO GROWTH
Special Requests: ADEQUATE

## 2020-11-23 ENCOUNTER — Encounter (INDEPENDENT_AMBULATORY_CARE_PROVIDER_SITE_OTHER): Payer: Medicare Other

## 2020-11-30 ENCOUNTER — Encounter (INDEPENDENT_AMBULATORY_CARE_PROVIDER_SITE_OTHER): Payer: Self-pay | Admitting: Nurse Practitioner

## 2020-11-30 ENCOUNTER — Encounter (INDEPENDENT_AMBULATORY_CARE_PROVIDER_SITE_OTHER): Payer: Medicare Other

## 2020-12-05 ENCOUNTER — Emergency Department: Payer: Medicare Other

## 2020-12-05 ENCOUNTER — Inpatient Hospital Stay
Admission: EM | Admit: 2020-12-05 | Discharge: 2020-12-12 | DRG: 602 | Disposition: A | Discharge status: Skilled Nursing Facility | Payer: Medicare Other | Attending: Internal Medicine | Admitting: Internal Medicine

## 2020-12-05 ENCOUNTER — Other Ambulatory Visit: Payer: Self-pay

## 2020-12-05 ENCOUNTER — Encounter: Payer: Self-pay | Admitting: Emergency Medicine

## 2020-12-05 DIAGNOSIS — Z8673 Personal history of transient ischemic attack (TIA), and cerebral infarction without residual deficits: Secondary | ICD-10-CM

## 2020-12-05 DIAGNOSIS — Z87891 Personal history of nicotine dependence: Secondary | ICD-10-CM | POA: Diagnosis not present

## 2020-12-05 DIAGNOSIS — Z8249 Family history of ischemic heart disease and other diseases of the circulatory system: Secondary | ICD-10-CM | POA: Diagnosis not present

## 2020-12-05 DIAGNOSIS — R609 Edema, unspecified: Secondary | ICD-10-CM

## 2020-12-05 DIAGNOSIS — I82532 Chronic embolism and thrombosis of left popliteal vein: Secondary | ICD-10-CM | POA: Diagnosis present

## 2020-12-05 DIAGNOSIS — Z20822 Contact with and (suspected) exposure to covid-19: Secondary | ICD-10-CM | POA: Diagnosis present

## 2020-12-05 DIAGNOSIS — Z7901 Long term (current) use of anticoagulants: Secondary | ICD-10-CM | POA: Diagnosis not present

## 2020-12-05 DIAGNOSIS — I82512 Chronic embolism and thrombosis of left femoral vein: Secondary | ICD-10-CM | POA: Diagnosis present

## 2020-12-05 DIAGNOSIS — M329 Systemic lupus erythematosus, unspecified: Secondary | ICD-10-CM | POA: Diagnosis present

## 2020-12-05 DIAGNOSIS — I10 Essential (primary) hypertension: Secondary | ICD-10-CM | POA: Diagnosis present

## 2020-12-05 DIAGNOSIS — D472 Monoclonal gammopathy: Secondary | ICD-10-CM | POA: Diagnosis present

## 2020-12-05 DIAGNOSIS — D72829 Elevated white blood cell count, unspecified: Secondary | ICD-10-CM | POA: Diagnosis not present

## 2020-12-05 DIAGNOSIS — R4182 Altered mental status, unspecified: Secondary | ICD-10-CM

## 2020-12-05 DIAGNOSIS — B952 Enterococcus as the cause of diseases classified elsewhere: Secondary | ICD-10-CM | POA: Diagnosis not present

## 2020-12-05 DIAGNOSIS — N390 Urinary tract infection, site not specified: Secondary | ICD-10-CM | POA: Diagnosis not present

## 2020-12-05 DIAGNOSIS — Z79899 Other long term (current) drug therapy: Secondary | ICD-10-CM

## 2020-12-05 DIAGNOSIS — I82552 Chronic embolism and thrombosis of left peroneal vein: Secondary | ICD-10-CM | POA: Diagnosis present

## 2020-12-05 DIAGNOSIS — Z8261 Family history of arthritis: Secondary | ICD-10-CM | POA: Diagnosis not present

## 2020-12-05 DIAGNOSIS — E785 Hyperlipidemia, unspecified: Secondary | ICD-10-CM | POA: Diagnosis present

## 2020-12-05 DIAGNOSIS — I82402 Acute embolism and thrombosis of unspecified deep veins of left lower extremity: Secondary | ICD-10-CM | POA: Diagnosis not present

## 2020-12-05 DIAGNOSIS — L03116 Cellulitis of left lower limb: Secondary | ICD-10-CM | POA: Diagnosis present

## 2020-12-05 DIAGNOSIS — G9341 Metabolic encephalopathy: Secondary | ICD-10-CM | POA: Diagnosis not present

## 2020-12-05 DIAGNOSIS — K219 Gastro-esophageal reflux disease without esophagitis: Secondary | ICD-10-CM | POA: Diagnosis present

## 2020-12-05 LAB — COMPREHENSIVE METABOLIC PANEL
ALT: 22 U/L (ref 0–44)
AST: 27 U/L (ref 15–41)
Albumin: 3.6 g/dL (ref 3.5–5.0)
Alkaline Phosphatase: 59 U/L (ref 38–126)
Anion gap: 7 (ref 5–15)
BUN: 15 mg/dL (ref 6–20)
CO2: 26 mmol/L (ref 22–32)
Calcium: 9.4 mg/dL (ref 8.9–10.3)
Chloride: 102 mmol/L (ref 98–111)
Creatinine, Ser: 0.86 mg/dL (ref 0.44–1.00)
GFR, Estimated: >60 mL/min (ref >60)
Glucose, Bld: 118 mg/dL — ABNORMAL HIGH (ref 70–99)
Potassium: 4.4 mmol/L (ref 3.5–5.1)
Sodium: 135 mmol/L (ref 135–145)
Total Bilirubin: 0.7 mg/dL (ref 0.3–1.2)
Total Protein: 9 g/dL — ABNORMAL HIGH (ref 6.5–8.1)

## 2020-12-05 LAB — APTT: aPTT: 25 seconds (ref 24–36)

## 2020-12-05 LAB — CBC WITH DIFFERENTIAL/PLATELET
Abs Immature Granulocytes: 0.05 10*3/uL (ref 0.00–0.07)
Basophils Absolute: 0 10*3/uL (ref 0.0–0.1)
Basophils Relative: 0 %
Eosinophils Absolute: 0 10*3/uL (ref 0.0–0.5)
Eosinophils Relative: 0 %
HCT: 36.6 % (ref 36.0–46.0)
Hemoglobin: 11.7 g/dL — ABNORMAL LOW (ref 12.0–15.0)
Immature Granulocytes: 0 %
Lymphocytes Relative: 7 %
Lymphs Abs: 0.9 10*3/uL (ref 0.7–4.0)
MCH: 31 pg (ref 26.0–34.0)
MCHC: 32 g/dL (ref 30.0–36.0)
MCV: 96.8 fL (ref 80.0–100.0)
Monocytes Absolute: 0.4 10*3/uL (ref 0.1–1.0)
Monocytes Relative: 3 %
Neutro Abs: 11.9 10*3/uL — ABNORMAL HIGH (ref 1.7–7.7)
Neutrophils Relative %: 90 %
Platelets: 87 10*3/uL — ABNORMAL LOW (ref 150–400)
RBC: 3.78 MIL/uL — ABNORMAL LOW (ref 3.87–5.11)
RDW: 13 % (ref 11.5–15.5)
WBC: 13.3 10*3/uL — ABNORMAL HIGH (ref 4.0–10.5)
nRBC: 0 % (ref 0.0–0.2)

## 2020-12-05 LAB — RESP PANEL BY RT-PCR (FLU A&B, COVID) ARPGX2
Influenza A by PCR: NEGATIVE
Influenza B by PCR: NEGATIVE
SARS Coronavirus 2 by RT PCR: NEGATIVE

## 2020-12-05 LAB — PROTIME-INR
INR: 1 (ref 0.8–1.2)
Prothrombin Time: 13.4 seconds (ref 11.4–15.2)

## 2020-12-05 LAB — AMMONIA: Ammonia: 13 umol/L (ref 9–35)

## 2020-12-05 LAB — LIPASE, BLOOD: Lipase: 30 U/L (ref 11–51)

## 2020-12-05 MED ORDER — ONDANSETRON HCL 4 MG PO TABS: 4.0000 mg | ORAL_TABLET | Freq: Four times a day (QID) | ORAL | Status: DC | PRN

## 2020-12-05 MED ORDER — ONDANSETRON HCL 4 MG/2ML IJ SOLN: 4.0000 mg | Freq: Four times a day (QID) | INTRAMUSCULAR | Status: DC | PRN

## 2020-12-05 MED ORDER — RIVAROXABAN 10 MG PO TABS
10.0000 mg | ORAL_TABLET | Freq: Every evening | ORAL | Status: DC
  Administered 2020-12-06 – 2020-12-11 (×5): 10 mg via ORAL
  Filled 2020-12-05 (×8): qty 1

## 2020-12-05 MED ORDER — HYDRALAZINE HCL 10 MG PO TABS
10.0000 mg | ORAL_TABLET | Freq: Three times a day (TID) | ORAL | Status: DC
  Administered 2020-12-06 – 2020-12-12 (×13): 10 mg via ORAL
  Filled 2020-12-05 (×21): qty 1

## 2020-12-05 MED ORDER — SODIUM CHLORIDE 0.9 % IV SOLN: INTRAVENOUS | Status: DC

## 2020-12-05 MED ORDER — SODIUM CHLORIDE 0.9 % IV SOLN
1.0000 g | Freq: Once | INTRAVENOUS | Status: AC
  Administered 2020-12-05: 1 g via INTRAVENOUS
  Filled 2020-12-05: qty 10

## 2020-12-05 MED ORDER — PANTOPRAZOLE SODIUM 20 MG PO TBEC
20.0000 mg | DELAYED_RELEASE_TABLET | Freq: Every day | ORAL | Status: DC
  Administered 2020-12-06 – 2020-12-12 (×5): 20 mg via ORAL
  Filled 2020-12-05 (×7): qty 1

## 2020-12-05 MED ORDER — ACETAMINOPHEN 650 MG RE SUPP: 650.0000 mg | Freq: Four times a day (QID) | RECTAL | Status: DC | PRN

## 2020-12-05 MED ORDER — ACETAMINOPHEN 325 MG PO TABS: 650.0000 mg | ORAL_TABLET | Freq: Four times a day (QID) | ORAL | Status: DC | PRN

## 2020-12-05 MED ORDER — SODIUM CHLORIDE 0.9 % IV SOLN
1.0000 g | INTRAVENOUS | Status: DC
  Administered 2020-12-06 – 2020-12-10 (×5): 1 g via INTRAVENOUS
  Filled 2020-12-05 (×3): qty 1
  Filled 2020-12-05: qty 10
  Filled 2020-12-05 (×2): qty 1

## 2020-12-05 MED ORDER — BACLOFEN 10 MG PO TABS
5.0000 mg | ORAL_TABLET | Freq: Three times a day (TID) | ORAL | Status: DC
  Administered 2020-12-06 – 2020-12-12 (×13): 5 mg
  Filled 2020-12-05 (×22): qty 0.5

## 2020-12-05 MED ORDER — IOHEXOL 350 MG/ML SOLN
100.0000 mL | Freq: Once | INTRAVENOUS | Status: AC | PRN
  Administered 2020-12-05: 100 mL via INTRAVENOUS
  Filled 2020-12-05: qty 100

## 2020-12-05 MED ORDER — ATORVASTATIN CALCIUM 20 MG PO TABS
40.0000 mg | ORAL_TABLET | Freq: Every day | ORAL | Status: DC
  Administered 2020-12-06 – 2020-12-12 (×5): 40 mg via ORAL
  Filled 2020-12-05 (×7): qty 2

## 2020-12-05 MED ORDER — MIRTAZAPINE 15 MG PO TABS
7.5000 mg | ORAL_TABLET | Freq: Every day | ORAL | Status: DC
  Administered 2020-12-07 – 2020-12-11 (×4): 7.5 mg via ORAL
  Filled 2020-12-05 (×4): qty 1

## 2020-12-05 MED ORDER — LEVETIRACETAM 500 MG PO TABS
1500.0000 mg | ORAL_TABLET | Freq: Two times a day (BID) | ORAL | Status: DC
  Administered 2020-12-06 – 2020-12-12 (×9): 1500 mg via ORAL
  Filled 2020-12-05 (×11): qty 3

## 2020-12-05 MED ORDER — POLYETHYLENE GLYCOL 3350 17 GM/SCOOP PO POWD
17.0000 g | Freq: Every day | ORAL | Status: DC
  Administered 2020-12-07 – 2020-12-09 (×2): 17 g via ORAL
  Filled 2020-12-05: qty 255

## 2020-12-05 MED ORDER — MAGNESIUM HYDROXIDE 400 MG/5ML PO SUSP: 30.0000 mL | Freq: Every day | ORAL | Status: DC | PRN

## 2020-12-05 MED ORDER — ENOXAPARIN SODIUM 40 MG/0.4ML IJ SOSY: 40.0000 mg | PREFILLED_SYRINGE | INTRAMUSCULAR | Status: DC

## 2020-12-05 MED ORDER — TRAZODONE HCL 50 MG PO TABS: 25.0000 mg | ORAL_TABLET | Freq: Every evening | ORAL | Status: DC | PRN

## 2020-12-05 NOTE — ED Notes (Signed)
IV access attempted x 1 by this RN and x 1 by [employer].  Blood work drawn by straight stick and sent to lab.

## 2020-12-05 NOTE — H&P (Signed)
[place]   PATIENT NAME: Valerie Bell    MR#:  [id number]  DATE OF BIRTH:  10/24/1965  DATE OF ADMISSION:  12/05/2020  PRIMARY CARE PHYSICIAN: [place], [facility]   Patient is coming from: Home  REQUESTING/REFERRING PHYSICIAN: Lannie Fields, PA-C  CHIEF COMPLAINT:   Chief Complaint  Patient presents with   Leg Swelling    HISTORY OF PRESENT ILLNESS:  Valerie Bell is a 55 y.o. female with medical history significant for hypertension, systemic lupus erythematosus, CVA and DVT, who presented to the emergency room with acute onset of worsening left lower extremity erythema and swelling with associated warmth and pain.  The patient was recently admitted for acute DVT of  both lower extremities for which she was started on Xarelto.  No chest pain or palpitations.  No cough or wheezing or hemoptysis.  No nausea or vomiting or abdominal pain.   ED Course: Upon presentation to the ER blood pressure was 168/98 with otherwise normal vital signs.  Labs revealed unremarkable CMP.  Ammonia level was 13.  CBC showed leukocytosis of 13.3 with neutrophilia as well as anemia.  Respiratory panel is currently pending.  Imaging: Venous Doppler of the left lower extremity revealed diffuse nonocclusive thrombus in the visualized venous structures with an overall appearance that is similar to that seen on prior ultrasound from 5/222/2022 with no definitive active acute thrombus noted.    CTA aortobifemoral showed the following: VASCULAR   Irregular soft atherosclerotic plaque in the abdominal aorta in the juxtarenal location. This is not flow limiting.   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   Lower extremity runoff is within normal limits with 2 vessel runoff to the feet bilaterally as described. No significant stenosis is noted.   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 This would correspond with  the known history of lower extremity deep venous thrombosis. This is new from 2016.   NON-VASCULAR   Diffuse soft tissue swelling in the left lower extremity consistent with the given clinical history. These changes are felt to represent cellulitis. No skin ulcer is seen. No underlying bony or muscular abnormality is seen.   Changes suggestive of mild constipation.   Chronic changes within the abdomen and pelvis similar to that seen on prior exams.  The patient was given a gram of IV Rocephin.  She will be admitted to a medical bed for further evaluation and management. 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   Spinal cord lesion ([facility])    Stroke ([place])     PAST SURGICAL HISTORY:   Past Surgical History:  Procedure Laterality Date   ABDOMINAL HYSTERECTOMY     BREAST BIOPSY Right 17+ years ago   negative Core Bx    SOCIAL HISTORY:   Social History   Tobacco Use   Smoking status: Former    Packs/day: 0.30    Years: 4.00    Pack years: 1.20    Types: Cigarettes    Quit date: 12/30/2003    Years since quitting: 16.9   Smokeless tobacco: Never  Substance Use Topics   Alcohol use: No    Alcohol/week: 0.0 standard drinks  FAMILY HISTORY:   Family History  Problem Relation Age of Onset   Arthritis Mother    Hypertension Mother    Breast cancer Neg Hx     DRUG ALLERGIES:   Allergies  Allergen Reactions   Ace Inhibitors Other (See Comments)    Angioedema of face    REVIEW OF SYSTEMS:   ROS As per history of present illness. All pertinent systems were reviewed above. Constitutional, HEENT, cardiovascular, respiratory, GI, GU, musculoskeletal, neuro, psychiatric, endocrine, integumentary and hematologic systems were reviewed and are otherwise  negative/unremarkable except for positive findings mentioned above in the HPI.   MEDICATIONS AT HOME:   Prior to Admission medications   Medication Sig Start Date End Date Taking? Authorizing Provider  atorvastatin (LIPITOR) 80 MG tablet Take 0.5 tablets (40 mg total) by mouth daily. 11/20/20   Annita Brod, MD  baclofen (LIORESAL) 10 MG tablet Place 5 mg into feeding tube 3 (three) times daily. 09/20/20   [provider]  hydrALAZINE (APRESOLINE) 10 MG tablet Take 10 mg by mouth 3 (three) times daily.    [provider]  hydrocortisone 1 % ointment Apply 1 application topically 2 (two) times daily as needed for itching.    [provider]  levETIRAcetam (KEPPRA) 750 MG tablet Take 1,500 mg by mouth 2 (two) times daily.    [provider]  mirtazapine (REMERON) 7.5 MG tablet Take 7.5 mg by mouth at bedtime.    [provider]  pantoprazole (PROTONIX) 20 MG tablet Take 20 mg by mouth daily.    [provider]  polyethylene glycol powder (GLYCOLAX/MIRALAX) 17 GM/SCOOP powder Take 17 g by mouth daily. 09/13/18   [provider]  rivaroxaban (XARELTO) 10 MG TABS tablet Take 10 mg by mouth every evening.    [provider]      VITAL SIGNS:  Blood pressure (!) 156/89, pulse 76, temperature 98 F (36.7 C), temperature source Oral, resp. rate 16, height $RemoveBe'5\' 4"'ylBGJvusY$  (1.626 m), weight 59 kg, last menstrual period 12/29/2001, SpO2 99 %.  PHYSICAL EXAMINATION:  Physical Exam  GENERAL:  55 y.o.-year-old patient lying in the bed with no acute distress.  EYES: Pupils equal, round, reactive to light and accommodation. No scleral icterus. Extraocular muscles intact.  HEENT: Head atraumatic, normocephalic. Oropharynx and nasopharynx clear.  NECK:  Supple, no jugular venous distention. No thyroid enlargement, no tenderness.  LUNGS: Normal breath sounds bilaterally, no wheezing, rales,rhonchi or crepitation. No use of accessory muscles of  respiration.  CARDIOVASCULAR: Regular rate and rhythm, S1, S2 normal. No murmurs, rubs, or gallops.  ABDOMEN: Soft, nondistended, nontender. Bowel sounds present. No organomegaly or mass.  EXTREMITIES: Right leg erythema swelling, warmth and tenderness with no cyanosis, or clubbing.  NEUROLOGIC: Cranial nerves II through XII are intact. Muscle strength 5/5 in all extremities. Sensation intact. Gait not checked.  PSYCHIATRIC: The patient is alert and oriented x 3.  Normal affect and good eye contact. SKIN: As above.   LABORATORY PANEL:   CBC Recent Labs  Lab 12/05/20 1844  WBC 13.3*  HGB 11.7*  HCT 36.6  PLT 87*   ------------------------------------------------------------------------------------------------------------------  Chemistries  Recent Labs  Lab 12/05/20 1844  NA 135  K 4.4  CL 102  CO2 26  GLUCOSE 118*  BUN 15  CREATININE 0.86  CALCIUM 9.4  AST 27  ALT 22  ALKPHOS 59  BILITOT 0.7   ------------------------------------------------------------------------------------------------------------------  Cardiac Enzymes No results for input(s): TROPONINI in the last 168 hours. ------------------------------------------------------------------------------------------------------------------  RADIOLOGY:  CT Angio Aortobifemoral W and/or Wo Contrast  Result Date: 12/05/2020 CLINICAL DATA:  Left lower extremity pain and swelling with known history of nonocclusive deep venous thrombosis and lower extremity edema. EXAM: CT ANGIOGRAPHY OF ABDOMINAL AORTA WITH ILIOFEMORAL RUNOFF TECHNIQUE: Multidetector CT imaging of the abdomen, pelvis and lower extremities was performed using the standard protocol during bolus administration of intravenous contrast. Multiplanar CT image reconstructions and MIPs were obtained to evaluate the vascular anatomy. CONTRAST:  17mL OMNIPAQUE IOHEXOL 350 MG/ML SOLN COMPARISON:  CT from 01/28/2015 FINDINGS: VASCULAR Aorta: Abdominal aorta  demonstrates mild irregular soft atherosclerotic plaque in the juxtarenal location. No aneurysmal dilatation or dissection is seen. 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 Renals: Both renal arteries are patent without evidence of aneurysm, dissection, vasculitis, fibromuscular dysplasia or significant stenosis. IMA: Patent without evidence of aneurysm, dissection, vasculitis or significant stenosis. RIGHT Lower Extremity Inflow: Right common and external iliac artery are within normal limits. Runoff: Common femoral artery and femoral bifurcation are widely patent. The superficial femoral and popliteal arteries on the right are within normal limits. Popliteal trifurcation is widely patent on the right with 2 vessel runoff into the foot. The peroneal artery continues to just above the ankle. LEFT Lower Extremity Inflow: Mild calcified plaque is noted at the origin of left common iliac artery. The common iliac artery and external iliac artery are widely patent. Runoff: Common femoral artery and femoral bifurcation are within normal limits. Superficial femoral and popliteal artery are unremarkable. Popliteal trifurcation is within normal limits. Two vessel runoff to the ankle is noted via the anterior tibial and peroneal artery. The posterior tibial artery is occluded distally. Veins: The inferior vena cava is slit like and may be secondary to chronic occlusion. This would correspond with the known history of prior deep venous thrombosis. The venous structures on the left are slightly more prominent than that on the right consistent with the known chronic thrombus. Review of the MIP images confirms the above findings. NON-VASCULAR Lower chest: Mild basilar atelectasis is noted in the left lower lobe. Hepatobiliary: No focal liver abnormality is seen. No gallstones, gallbladder wall thickening, or biliary  dilatation. Pancreas: Pancreas is well visualized. A focal hypodensity is noted in the midportion of the pancreas best seen on image number 39 of series 4 measuring 7.6 mm. This is stable in appearance in retrospect dating back to 2016. Spleen: Normal in size without focal abnormality. Adrenals/Urinary Tract: Adrenal glands are within normal limits. Kidneys demonstrate a normal enhancement pattern bilaterally. No renal calculi are seen. Hypodensity in the left kidney is noted consistent with a 1 cm cyst. No obstructive changes are noted. The bladder is partially distended. Stomach/Bowel: Scattered fecal material is noted throughout the colon without obstructive or inflammatory changes. The appendix is air-filled and within normal limits. No inflammatory changes are noted. Stomach and small bowel are unremarkable. Lymphatic: No significant lymphadenopathy is noted. Reproductive: Status post hysterectomy. No adnexal masses. Other: No abdominal wall hernia or abnormality. No abdominopelvic ascites. Musculoskeletal: Degenerative changes of lumbar spine are noted. No bony abnormality in the lower extremities is seen. Generalized soft tissue swelling is noted in the left foot and left lower leg consistent with cellulitis. No focal wound is noted. No underlying muscular abnormality is seen. IMPRESSION: VASCULAR Irregular soft atherosclerotic plaque in the abdominal aorta in the juxtarenal location. This is not flow limiting. High-grade stenosis of the celiac axis  origin is noted although normal opacification is seen. Lower extremity runoff is within normal limits with 2 vessel runoff to the feet bilaterally as described. No significant stenosis is noted. 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 This would correspond with the known history of lower extremity deep venous thrombosis. This is new from 2016. NON-VASCULAR Diffuse soft tissue swelling in the left lower extremity consistent with the given  clinical history. These changes are felt to represent cellulitis. No skin ulcer is seen. No underlying bony or muscular abnormality is seen. Changes suggestive of mild constipation. Chronic changes within the abdomen and pelvis similar to that seen on prior exams. Electronically Signed   By: Inez Catalina M.D.   On: 12/05/2020 21:41   US Venous Img Lower Unilateral Left  Result Date: 12/05/2020 CLINICAL DATA:  Left leg pain and swelling for 2 days EXAM: LEFT LOWER EXTREMITY VENOUS DOPPLER ULTRASOUND TECHNIQUE: Gray-scale sonography with graded compression, as well as color Doppler and duplex ultrasound were performed to evaluate the lower extremity deep venous systems from the level of the common femoral vein and including the common femoral, femoral, profunda femoral, popliteal and calf veins including the posterior tibial, peroneal and gastrocnemius veins when visible. The superficial great saphenous vein was also interrogated. Spectral Doppler was utilized to evaluate flow at rest and with distal augmentation maneuvers in the common femoral, femoral and popliteal veins. COMPARISON:  11/07/2020 FINDINGS: Contralateral Common Femoral Vein: Nonocclusive thrombus is noted with decreased compressibility. Common Femoral Vein: Nonocclusive thrombus is noted with decreased compressibility. Saphenofemoral Junction: Nonocclusive thrombus is noted with decreased compressibility. Profunda Femoral Vein: No evidence of thrombus. Normal compressibility and flow on color Doppler imaging. Femoral Vein: Nonocclusive thrombus is noted with decreased compressibility. Popliteal Vein: Nonocclusive thrombus is noted with decreased compressibility. Calf Veins: Nonocclusive thrombus is noted with decreased compressibility. Peroneal vein is not well visualized. Superficial Great Saphenous Vein: No evidence of thrombus. Normal compressibility. Venous Reflux:  None. Other Findings:  Calf edema is noted IMPRESSION: Diffuse nonocclusive  thrombus in the visualized venous structures. The overall appearance is similar to that seen on prior ultrasound from 11/07/2020. No definitive acute thrombus is noted. Electronically Signed   By: Inez Catalina M.D.   On: 12/05/2020 19:13      IMPRESSION AND PLAN:  Active Problems:   Left leg cellulitis  1.  Moderate left lower extremity nonpurulent cellulitis. - The patient will be admitted to a medical bed - We will continue antibiotic therapy with IV Rocephin. - Warm compresses will be applied. - We will follow her leukocytosis.  2.  Recent lower extremity DVT, currently stable. - The patient will be continued on p.o. Xarelto.  3.  Essential hypertension. - We will continue amlodipine and hydralazine.  4.  Dyslipidemia. - We will continue statin therapy.  5.  GERD. - We will continue PPI therapy.  DVT prophylaxis: We will continue Xarelto.   Code Status: full code. Family Communication:  The plan of care was discussed in details with the patient (and family). I answered all questions. The patient agreed to proceed with the above mentioned plan. Further management will depend upon hospital course. Disposition Plan: Back to previous home environment Consults called: none. All the records are reviewed and case discussed with ED provider.  Status is: Inpatient  Remains inpatient appropriate because:Ongoing active pain requiring inpatient pain management, Ongoing diagnostic testing needed not appropriate for outpatient work up, Unsafe d/c plan, IV treatments appropriate due to intensity of illness  or inability to take PO, and Inpatient level of care appropriate due to severity of illness  Dispo: The patient is from: Home              Anticipated d/c is to: Home              Patient currently is not medically stable to d/c.   Difficult to place patient No  TOTAL TIME TAKING CARE OF THIS PATIENT: 55 minutes.    Christel Mormon M.D on 12/05/2020 at 10:43 PM  [facility]    From 7 PM-7 AM, contact night-coverage [email or web address]  CC: Primary care physician; [facility]

## 2020-12-05 NOTE — ED Provider Notes (Signed)
[facility]-EMERGENCY DEPARTMENT  ____________________________________________  Time seen: Approximately 8:30 PM  I have reviewed the triage vital signs and the nursing notes.   HISTORY  Chief Complaint Leg Swelling   Historian Son     HPI REVIA NGHIEM is a 55 y.o. female with a history of prior DVT, lupus, prior CVA and cellulitis, presents to the emergency department with progressively worsening left lower extremity edema and erythema.  Patient is accompanied by her son.  Patient is nonverbal at baseline and patient's son provides much of the history.  Patient is currently taking Xarelto for prior bilateral lower extremity DVTs.  No fevers or chills to son's knowledge.  No preceding chest pain, chest tightness or shortness of breath.   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   Spinal cord lesion ([place])    Stroke [facility])     Patient Active Problem List   Diagnosis Date Noted   Left leg cellulitis 12/05/2020   UTI (urinary tract infection) 11/17/2020   Spinal cord lesion (HCC)    Stroke (HCC)    Thrombocytopenia (HCC)    Sacral pressure ulcer    Depression    AKI (acute kidney injury) ([place])    Normocytic anemia 11/09/2020   Dyslipidemia 07/08/2019   Acute deep vein thrombosis (DVT) of left lower extremity ([place]) 06/20/2019   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[place]) 06/20/2019   Gastrostomy tube in place ([place]) 06/20/2019   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   PRES (posterior reversible encephalopathy syndrome) 02/24/2019   Dysphagia 02/20/2019   Chronic deep vein thrombosis (DVT) of femoral vein of right lower extremity ([place]) 09/04/2018   Pressure injury of skin 99/35/7017   Acute metabolic encephalopathy 79/39/0300   Encephalitis 08/27/2018   Benign essential HTN 08/27/2018   Leucocytosis 08/27/2018   Lupus ([place]) 08/27/2018   DVT (deep vein thrombosis) in pregnancy 08/27/2018   Lethargy 08/24/2018   Weakness 01/07/2018   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 Authorizing Provider  atorvastatin (LIPITOR) 80 MG tablet Take 0.5 tablets (40 mg total) by mouth daily. 11/20/20   Annita Brod, MD  baclofen (LIORESAL) 10 MG tablet Place 5 mg into feeding tube 3 (three) times daily. 09/20/20   [provider]  hydrALAZINE (APRESOLINE) 10 MG tablet Take 10 mg by mouth 3 (three) times daily.    [provider]  hydrocortisone 1 % ointment Apply 1 application topically 2 (two) times daily as needed for itching.    [provider]  levETIRAcetam (KEPPRA) 750 MG tablet Take 1,500 mg by mouth  2 (two) times daily.    [provider]  mirtazapine (REMERON) 7.5 MG tablet Take 7.5 mg by mouth at bedtime.    [provider]  pantoprazole (PROTONIX) 20 MG tablet Take 20 mg by mouth daily.    [provider]  polyethylene glycol powder (GLYCOLAX/MIRALAX) 17 GM/SCOOP powder Take 17 g by mouth daily. 09/13/18   [provider]  rivaroxaban (XARELTO) 10 MG TABS tablet Take 10 mg by mouth every evening.    [provider]    Allergies Ace inhibitors  Family History  Problem  Relation Age of Onset   Arthritis Mother    Hypertension Mother    Breast cancer Neg Hx     Social History Social History   Tobacco Use   Smoking status: Former    Packs/day: 0.30    Years: 4.00    Pack years: 1.20    Types: Cigarettes    Quit date: 12/30/2003    Years since quitting: 16.9   Smokeless tobacco: Never  Substance Use Topics   Alcohol use: No    Alcohol/week: 0.0 standard drinks   Drug use: Yes    Types: Marijuana    Comment: 2 days ago.      Review of Systems  Constitutional: No fever/chills Eyes:  No discharge ENT: No upper respiratory complaints. Respiratory: no cough. No SOB/ use of accessory muscles to breath Gastrointestinal:   No nausea, no vomiting.  No diarrhea.  No constipation. Musculoskeletal: Negative for musculoskeletal pain. Skin: Patient has left lower extremity erythema and edema.   ____________________________________________   PHYSICAL EXAM:  VITAL SIGNS: ED Triage Vitals [12/05/20 1758]  Enc Vitals Group     BP (!) 168/98     Pulse Rate 71     Resp 16     Temp 98 F (36.7 C)     Temp Source Oral     SpO2 100 %     Weight 130 lb (59 kg)     Height _0  (1.626 m)     Head Circumference      Peak Flow      Pain Score 0     Pain Loc      Pain Edu?      Excl. in [place]?      Constitutional: Alert and oriented. Well appearing and in no acute distress. Eyes: Conjunctivae are normal. PERRL. EOMI. Head: Atraumatic. ENT: Cardiovascular: Normal rate, regular rhythm. Normal S1 and S2.  Good peripheral circulation. Respiratory: Normal respiratory effort without tachypnea or retractions. Lungs CTAB. Good air entry to the bases with no decreased or absent breath sounds Gastrointestinal: Bowel sounds x 4 quadrants. Soft and nontender to palpation. No guarding or rigidity. No distention. Musculoskeletal: Full range of motion to all extremities. No obvious deformities noted Neurologic:  Normal for age. No gross focal neurologic  deficits are appreciated.  Skin: Patient has 3+ pitting edema of the left foot and left distal ankle.  Patient has dusky desquamation around distal metatarsals.  Dorsalis pedis pulse auscultated by pedal Doppler.  Capillary refill less than 3 seconds. Psychiatric: Mood and affect are normal for age. Speech and behavior are normal.   ____________________________________________   LABS (all labs ordered are listed, but only abnormal results are displayed)  Labs Reviewed  CBC WITH DIFFERENTIAL/PLATELET - Abnormal; Notable for the following components:      Result Value   WBC 13.3 (*)    RBC 3.78 (*)    Hemoglobin 11.7 (*)  Platelets 87 (*)    Neutro Abs 11.9 (*)    All other components within normal limits  COMPREHENSIVE METABOLIC PANEL - Abnormal; Notable for the following components:   Glucose, Bld 118 (*)    Total Protein 9.0 (*)    All other components within normal limits  RESP PANEL BY RT-PCR (FLU A&B, COVID) ARPGX2  LIPASE, BLOOD  PROTIME-INR  APTT  AMMONIA  APTT  PROTIME-INR  URINALYSIS, COMPLETE (UACMP) WITH MICROSCOPIC  BASIC METABOLIC PANEL  CBC   ____________________________________________  EKG   ____________________________________________  RADIOLOGY Unk Pinto, personally viewed and evaluated these images (plain radiographs) as part of my medical decision making, as well as reviewing the written report by the radiologist.  CT Angio Aortobifemoral W and/or Wo Contrast  Result Date: 12/05/2020 CLINICAL DATA:  Left lower extremity pain and swelling with known history of nonocclusive deep venous thrombosis and lower extremity edema. EXAM: CT ANGIOGRAPHY OF ABDOMINAL AORTA WITH ILIOFEMORAL RUNOFF TECHNIQUE: Multidetector CT imaging of the abdomen, pelvis and lower extremities was performed using the standard protocol during bolus administration of intravenous contrast. Multiplanar CT image reconstructions and MIPs were obtained to evaluate the vascular  anatomy. CONTRAST:  114m OMNIPAQUE IOHEXOL 350 MG/ML SOLN COMPARISON:  CT from 01/28/2015 FINDINGS: VASCULAR Aorta: Abdominal aorta demonstrates mild irregular soft atherosclerotic plaque in the juxtarenal location. No aneurysmal dilatation or dissection is seen. 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 Renals: Both renal arteries are patent without evidence of aneurysm, dissection, vasculitis, fibromuscular dysplasia or significant stenosis. IMA: Patent without evidence of aneurysm, dissection, vasculitis or significant stenosis. RIGHT Lower Extremity Inflow: Right common and external iliac artery are within normal limits. Runoff: Common femoral artery and femoral bifurcation are widely patent. The superficial femoral and popliteal arteries on the right are within normal limits. Popliteal trifurcation is widely patent on the right with 2 vessel runoff into the foot. The peroneal artery continues to just above the ankle. LEFT Lower Extremity Inflow: Mild calcified plaque is noted at the origin of left common iliac artery. The common iliac artery and external iliac artery are widely patent. Runoff: Common femoral artery and femoral bifurcation are within normal limits. Superficial femoral and popliteal artery are unremarkable. Popliteal trifurcation is within normal limits. Two vessel runoff to the ankle is noted via the anterior tibial and peroneal artery. The posterior tibial artery is occluded distally. Veins: The inferior vena cava is slit like and may be secondary to chronic occlusion. This would correspond with the known history of prior deep venous thrombosis. The venous structures on the left are slightly more prominent than that on the right consistent with the known chronic thrombus. Review of the MIP images confirms the above findings. NON-VASCULAR Lower chest: Mild basilar atelectasis is noted  in the left lower lobe. Hepatobiliary: No focal liver abnormality is seen. No gallstones, gallbladder wall thickening, or biliary dilatation. Pancreas: Pancreas is well visualized. A focal hypodensity is noted in the midportion of the pancreas best seen on image number 39 of series 4 measuring 7.6 mm. This is stable in appearance in retrospect dating back to 2016. Spleen: Normal in size without focal abnormality. Adrenals/Urinary Tract: Adrenal glands are within normal limits. Kidneys demonstrate a normal enhancement pattern bilaterally. No renal calculi are seen. Hypodensity in the left kidney is noted consistent with a 1 cm cyst. No obstructive changes are noted. The bladder is partially distended. Stomach/Bowel: Scattered  fecal material is noted throughout the colon without obstructive or inflammatory changes. The appendix is air-filled and within normal limits. No inflammatory changes are noted. Stomach and small bowel are unremarkable. Lymphatic: No significant lymphadenopathy is noted. Reproductive: Status post hysterectomy. No adnexal masses. Other: No abdominal wall hernia or abnormality. No abdominopelvic ascites. Musculoskeletal: Degenerative changes of lumbar spine are noted. No bony abnormality in the lower extremities is seen. Generalized soft tissue swelling is noted in the left foot and left lower leg consistent with cellulitis. No focal wound is noted. No underlying muscular abnormality is seen. IMPRESSION: VASCULAR Irregular soft atherosclerotic plaque in the abdominal aorta in the juxtarenal location. This is not flow limiting. 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 Lower extremity runoff is within normal limits with 2 vessel runoff to the feet bilaterally as described. No significant stenosis is noted. 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 This would correspond with the known history of lower extremity deep venous  thrombosis. This is new from 2016. NON-VASCULAR Diffuse soft tissue swelling in the left lower extremity consistent with the given clinical history. These changes are felt to represent cellulitis. No skin ulcer is seen. No underlying bony or muscular abnormality is seen. Changes suggestive of mild constipation. Chronic changes within the abdomen and pelvis similar to that seen on prior exams. Electronically Signed   By: Inez [place] M.D.   On: 12/05/2020 21:41   US Venous Img Lower Unilateral Left  Result Date: 12/05/2020 CLINICAL DATA:  Left leg pain and swelling for 2 days EXAM: LEFT LOWER EXTREMITY VENOUS DOPPLER ULTRASOUND TECHNIQUE: Gray-scale sonography with graded compression, as well as color Doppler and duplex ultrasound were performed to evaluate the lower extremity deep venous systems from the level of the common femoral vein and including the common femoral, femoral, profunda femoral, popliteal and calf veins including the posterior tibial, peroneal and gastrocnemius veins when visible. The superficial great saphenous vein was also interrogated. Spectral Doppler was utilized to evaluate flow at rest and with distal augmentation maneuvers in the common femoral, femoral and popliteal veins. COMPARISON:  11/07/2020 FINDINGS: Contralateral Common Femoral Vein: Nonocclusive thrombus is noted with decreased compressibility. Common Femoral Vein: Nonocclusive thrombus is noted with decreased compressibility. Saphenofemoral Junction: Nonocclusive thrombus is noted with decreased compressibility. Profunda Femoral Vein: No evidence of thrombus. Normal compressibility and flow on color Doppler imaging. Femoral Vein: Nonocclusive thrombus is noted with decreased compressibility. Popliteal Vein: Nonocclusive thrombus is noted with decreased compressibility. Calf Veins: Nonocclusive thrombus is noted with decreased compressibility. Peroneal vein is not well visualized. Superficial Great Saphenous Vein: No evidence  of thrombus. Normal compressibility. Venous Reflux:  None. Other Findings:  Calf edema is noted IMPRESSION: Diffuse nonocclusive thrombus in the visualized venous structures. The overall appearance is similar to that seen on prior ultrasound from 11/07/2020. No definitive acute thrombus is noted. Electronically Signed   By: Inez [place] M.D.   On: 12/05/2020 19:13    ____________________________________________    PROCEDURES  Procedure(s) performed:     Procedures     Medications  atorvastatin (LIPITOR) tablet 40 mg (has no administration in time range)  hydrALAZINE (APRESOLINE) tablet 10 mg (has no administration in time range)  mirtazapine (REMERON) tablet 7.5 mg (has no administration in time range)  pantoprazole (PROTONIX) EC tablet 20 mg (has no administration in time range)  polyethylene glycol powder (GLYCOLAX/MIRALAX) container 17 g (has no administration in time range)  rivaroxaban (XARELTO) tablet 10  mg (has no administration in time range)  baclofen (LIORESAL) tablet 5 mg (has no administration in time range)  levETIRAcetam (KEPPRA) tablet 1,500 mg (has no administration in time range)  cefTRIAXone (ROCEPHIN) 1 g in sodium chloride 0.9 % 100 mL IVPB (has no administration in time range)  enoxaparin (LOVENOX) injection 40 mg (has no administration in time range)  0.9 %  sodium chloride infusion (has no administration in time range)  acetaminophen (TYLENOL) tablet 650 mg (has no administration in time range)    Or  acetaminophen (TYLENOL) suppository 650 mg (has no administration in time range)  traZODone (DESYREL) tablet 25 mg (has no administration in time range)  magnesium hydroxide (MILK OF MAGNESIA) suspension 30 mL (has no administration in time range)  ondansetron (ZOFRAN) tablet 4 mg (has no administration in time range)    Or  ondansetron (ZOFRAN) injection 4 mg (has no administration in time range)  iohexol (OMNIPAQUE) 350 MG/ML injection 100 mL (100 mLs  Intravenous Contrast Given 12/05/20 2053)  cefTRIAXone (ROCEPHIN) 1 g in sodium chloride 0.9 % 100 mL IVPB (1 g Intravenous New Bag/Given 12/05/20 2226)     ____________________________________________   INITIAL IMPRESSION / ASSESSMENT AND PLAN / ED COURSE  Pertinent labs & imaging results that were available during my care of the patient were reviewed by me and considered in my medical decision making (see chart for details).      Assessment and plan Left lower extremity swelling and erythema 55 year old female presents to the emergency department with progressively worsening left lower extremity erythema and edema.  Patient was hypertensive at triage but vital signs otherwise reassuring.  Patient had a dorsalis pedis pulse that was auscultated with fetal Doppler.  Repeat venous ultrasound of the left showed chronic DVTs that had not changed since prior study.  Patient had leukocytosis on CBC with left shift.  CMP was reassuring.  Differential diagnosis included cellulitis, necrotizing fasciitis, arterial occlusion, acute on chronic DVT...  Repeat venous ultrasound showed no signs of new DVT.  Leukocytosis on CBC increases suspicion for cellulitis.  I had an extensive conversation with my attending, Dr. Ellender Hose regarding concern for necrotizing fasciitis versus arterial occlusion and we agreed to proceed with an angio of the lower extremity.  No arterial occlusion or evidence necrotizing fasciitis on dedicated CT angio of the left lower extremity.  Patient was given IV Rocephin in the emergency department and admitted to the hospitalist service.     ____________________________________________  FINAL CLINICAL IMPRESSION(S) / ED DIAGNOSES  Final diagnoses:  Cellulitis of left lower extremity      NEW MEDICATIONS STARTED DURING THIS VISIT:  ED Discharge Orders     None           This chart was dictated using voice recognition software/Dragon. Despite best efforts to  proofread, errors can occur which can change the meaning. Any change was purely unintentional.     Lannie Fields, PA-C 12/05/20 2352    Duffy Bruce, MD 12/06/20 [phone]

## 2020-12-05 NOTE — ED Provider Notes (Signed)
Patient care shared with PA.  Briefly, four 55 year old female here with acute left lower extremity swelling and pain.  Patient has known nonocclusive thrombus which is not significantly changed from May.  She does have significant edema.  Leukocytosis with left shift noted.  Patient will obtain CT angio looking for deep or necrotizing infection, start IV antibiotics and admit.   Duffy Bruce, MD 12/05/20 2035

## 2020-12-05 NOTE — ED Triage Notes (Addendum)
Pt via POV from home. Pt has swelling to the L leg and calf. Unknown when pt first noticed the swelling. Pt has had a DVT in the past. Denies pain. Denies injury. Pt is A&Ox4 and NAD. Pt at baseline has trouble speaking.

## 2020-12-05 NOTE — ED Notes (Addendum)
Increased swelling x 2 days per pt son. Pt lives at home with a roommate. Pt denies any pain in extremity. Pt is normally ambulatory prior to swelling. Pt at baseline per son. Son advised pt was seen at vascular clinic last Tuesday and was seen to assess her legs and clots and wrap her legs.

## 2020-12-06 ENCOUNTER — Inpatient Hospital Stay: Payer: Medicare Other

## 2020-12-06 LAB — PROTIME-INR
INR: 1.1 (ref 0.8–1.2)
Prothrombin Time: 13.8 seconds (ref 11.4–15.2)

## 2020-12-06 LAB — BASIC METABOLIC PANEL
Anion gap: 9 (ref 5–15)
BUN: 14 mg/dL (ref 6–20)
CO2: 23 mmol/L (ref 22–32)
Calcium: 8.9 mg/dL (ref 8.9–10.3)
Chloride: 103 mmol/L (ref 98–111)
Creatinine, Ser: 0.78 mg/dL (ref 0.44–1.00)
GFR, Estimated: >60 mL/min (ref >60)
Glucose, Bld: 85 mg/dL (ref 70–99)
Potassium: 4 mmol/L (ref 3.5–5.1)
Sodium: 135 mmol/L (ref 135–145)

## 2020-12-06 LAB — CBC
HCT: 34.2 % — ABNORMAL LOW (ref 36.0–46.0)
Hemoglobin: 11.3 g/dL — ABNORMAL LOW (ref 12.0–15.0)
MCH: 31.4 pg (ref 26.0–34.0)
MCHC: 33 g/dL (ref 30.0–36.0)
MCV: 95 fL (ref 80.0–100.0)
Platelets: 60 10*3/uL — ABNORMAL LOW (ref 150–400)
RBC: 3.6 MIL/uL — ABNORMAL LOW (ref 3.87–5.11)
RDW: 12.8 % (ref 11.5–15.5)
WBC: 12.1 10*3/uL — ABNORMAL HIGH (ref 4.0–10.5)
nRBC: 0 % (ref 0.0–0.2)

## 2020-12-06 LAB — APTT: aPTT: 29 seconds (ref 24–36)

## 2020-12-06 MED ORDER — KETOROLAC TROMETHAMINE 15 MG/ML IJ SOLN
15.0000 mg | Freq: Four times a day (QID) | INTRAMUSCULAR | Status: DC
  Administered 2020-12-06 – 2020-12-09 (×7): 15 mg via INTRAVENOUS
  Filled 2020-12-06 (×8): qty 1

## 2020-12-06 MED ORDER — OXYCODONE HCL 5 MG PO TABS
5.0000 mg | ORAL_TABLET | ORAL | Status: DC | PRN
  Administered 2020-12-11: 5 mg via ORAL
  Filled 2020-12-06: qty 1

## 2020-12-06 NOTE — Clinical Social Work Note (Signed)
Patient is active with [facility]. Will need home health orders for PT, RN, and speech at discharge.  Dayton Scrape, [place]

## 2020-12-06 NOTE — Progress Notes (Signed)
PROGRESS NOTE    ARLIS EVERLY  EUM:[id number] DOB: 1966-02-03 DOA: 12/05/2020 PCP: [facility]   Brief Narrative: 55 y.o. female with medical history significant for hypertension, systemic lupus erythematosus, CVA and DVT, who presented to the emergency room with acute onset of worsening left lower extremity erythema and swelling with associated warmth and pain.  The patient was recently admitted for acute DVT of  both lower extremities for which she was started on Xarelto.  No chest pain or palpitations.  No cough or wheezing or hemoptysis.  No nausea or vomiting or abdominal pain.  Imaging revealed no thrombus.  Patient does have evidence of vascular disease however lower extremity runoff is normal.  No significant lower extremity stenosis is noted.   Assessment & Plan:   Active Problems:   Left leg cellulitis  Left lower extremity nonpurulent cellulitis Likely underlying clot burden is contributing Patient is hemodynamically stable Plan: Continue IV Rocephin for now Follow blood cultures Check plain film x-ray If x-ray demonstrates concern for osteomyelitis we will pursue MRI  Right lower extremity DVT Clot burden appears stable on imaging Continue Xarelto  Essential hypertension PTA amlodipine and hydralazine  Hyperlipidemia PTA statin  GERD PPI   DVT prophylaxis: Xarelto Code Status: Full Family Communication: None today Disposition Plan: Status is: Inpatient  Remains inpatient appropriate because:Inpatient level of care appropriate due to severity of illness  Dispo: The patient is from: Home              Anticipated d/c is to: Home              Patient currently is not medically stable to d/c.   Difficult to place patient No  Patient has fairly significant left lower extremity cellulitis.     Level of care: Med-Surg  Consultants:  None  Procedures:  None  Antimicrobials:  Ceftriaxone   Subjective: Patient seen and examined.   Continues to endorse pain and swelling of left lower extremity and decreased ability to ambulate  Objective: Vitals:   12/06/20 0300 12/06/20 0455 12/06/20 0803 12/06/20 0955  BP: (!) 151/86 (!) 141/98 (!) 143/91 130/88  Pulse: 70 74 70 100  Resp: 14 16 18 14   Temp:  97.8 F (36.6 C) 97.8 F (36.6 C) 98.8 F (37.1 C)  TempSrc:  Oral Oral Oral  SpO2: 99% 100% 99% 100%  Weight:      Height:        Intake/Output Summary (Last 24 hours) at 12/06/2020 1020 Last data filed at 12/06/2020 0959 Gross per 24 hour  Intake 480 ml  Output --  Net 480 ml   Filed Weights   12/05/20 1758  Weight: 59 kg    Examination:  General exam: Appears calm and comfortable  Respiratory system: Clear to auscultation. Respiratory effort normal. Cardiovascular system: S1 & S2 heard, RRR. No JVD, murmurs, rubs, gallops or clicks. No pedal edema. Gastrointestinal system: Abdomen is nondistended, soft and nontender. No organomegaly or masses felt. Normal bowel sounds heard. Central nervous system: Alert and oriented. No focal neurological deficits. Extremities:   Left lower extremity swollen, tender to touch, decreased range of motion Skin: Cellulitic changes of left lower extremity Psychiatry: Judgement and insight appear normal. Mood & affect appropriate.     Data Reviewed: I have personally reviewed following labs and imaging studies  CBC: Recent Labs  Lab 12/05/20 1844 12/06/20 0638  WBC 13.3* 12.1*  NEUTROABS 11.9*  --   HGB 11.7* 11.3*  HCT 36.6  34.2*  MCV 96.8 95.0  PLT 87* 60*   Basic Metabolic Panel: Recent Labs  Lab 12/05/20 1844 12/06/20 0638  NA 135 135  K 4.4 4.0  CL 102 103  CO2 26 23  GLUCOSE 118* 85  BUN 15 14  CREATININE 0.86 0.78  CALCIUM 9.4 8.9   GFR: Estimated Creatinine Clearance: 69.4 mL/min (by C-G formula based on SCr of 0.78 mg/dL). Liver Function Tests: Recent Labs  Lab 12/05/20 1844  AST 27  ALT 22  ALKPHOS 59  BILITOT 0.7  PROT 9.0*  ALBUMIN  3.6   Recent Labs  Lab 12/05/20 1844  LIPASE 30   Recent Labs  Lab 12/05/20 2006  AMMONIA 13   Coagulation Profile: Recent Labs  Lab 12/05/20 1940 12/05/20 2006  INR 1.1 1.0   Cardiac Enzymes: No results for input(s): CKTOTAL, CKMB, CKMBINDEX, TROPONINI in the last 168 hours. BNP (last 3 results) No results for input(s): PROBNP in the last 8760 hours. HbA1C: No results for input(s): HGBA1C in the last 72 hours. CBG: No results for input(s): GLUCAP in the last 168 hours. Lipid Profile: No results for input(s): CHOL, HDL, LDLCALC, TRIG, CHOLHDL, LDLDIRECT in the last 72 hours. Thyroid Function Tests: No results for input(s): TSH, T4TOTAL, FREET4, T3FREE, THYROIDAB in the last 72 hours. Anemia Panel: No results for input(s): VITAMINB12, FOLATE, FERRITIN, TIBC, IRON, RETICCTPCT in the last 72 hours. Sepsis Labs: No results for input(s): PROCALCITON, LATICACIDVEN in the last 168 hours.  Recent Results (from the past 240 hour(s))  Resp Panel by RT-PCR (Flu A&B, Covid) Nasopharyngeal Swab     Status: None   Collection Time: 12/05/20 10:30 PM   Specimen: Nasopharyngeal Swab; Nasopharyngeal(NP) swabs in vial transport medium  Result Value Ref Range Status   SARS Coronavirus 2 by RT PCR NEGATIVE NEGATIVE Final    Comment: (NOTE) SARS-CoV-2 target nucleic acids are NOT DETECTED.  The SARS-CoV-2 RNA is generally detectable in upper respiratory specimens during the acute phase of infection. The lowest concentration of SARS-CoV-2 viral copies this assay can detect is 138 copies/mL. A negative result does not preclude SARS-Cov-2 infection and should not be used as the sole basis for treatment or other patient management decisions. A negative result may occur with  improper specimen collection/handling, submission of specimen other than nasopharyngeal swab, presence of viral mutation(s) within the areas targeted by this assay, and inadequate number of viral copies(<138  copies/mL). A negative result must be combined with clinical observations, patient history, and epidemiological information. The expected result is Negative.  Fact Sheet for Patients:  EntrepreneurPulse.com.au  Fact Sheet for Healthcare Providers:  IncredibleEmployment.be  This test is no t yet approved or cleared by the [place] FDA and  has been authorized for detection and/or diagnosis of SARS-CoV-2 by FDA under an Emergency Use Authorization (EUA). This EUA will remain  in effect (meaning this test can be used) for the duration of the COVID-19 declaration under Section 564(b)(1) of the Act, 21 U.S.C.section 360bbb-3(b)(1), unless the authorization is terminated  or revoked sooner.       Influenza A by PCR NEGATIVE NEGATIVE Final   Influenza B by PCR NEGATIVE NEGATIVE Final    Comment: (NOTE) The Xpert Xpress SARS-CoV-2/FLU/RSV plus assay is intended as an aid in the diagnosis of influenza from Nasopharyngeal swab specimens and should not be used as a sole basis for treatment. Nasal washings and aspirates are unacceptable for Xpert Xpress SARS-CoV-2/FLU/RSV testing.  Fact Sheet for Patients: EntrepreneurPulse.com.au  Fact Sheet  for Healthcare Providers: IncredibleEmployment.be  This test is not yet approved or cleared by the [place] and has been authorized for detection and/or diagnosis of SARS-CoV-2 by FDA under an Emergency Use Authorization (EUA). This EUA will remain in effect (meaning this test can be used) for the duration of the COVID-19 declaration under Section 564(b)(1) of the Act, 21 U.S.C. section 360bbb-3(b)(1), unless the authorization is terminated or revoked.  Performed at [facility], [address] [place], [place] [address]          Radiology Studies: CT Angio Aortobifemoral W and/or Wo Contrast  Result Date: 12/05/2020 CLINICAL DATA:  Left  lower extremity pain and swelling with known history of nonocclusive deep venous thrombosis and lower extremity edema. EXAM: CT ANGIOGRAPHY OF ABDOMINAL AORTA WITH ILIOFEMORAL RUNOFF TECHNIQUE: Multidetector CT imaging of the abdomen, pelvis and lower extremities was performed using the standard protocol during bolus administration of intravenous contrast. Multiplanar CT image reconstructions and MIPs were obtained to evaluate the vascular anatomy. CONTRAST:  11mL OMNIPAQUE IOHEXOL 350 MG/ML SOLN COMPARISON:  CT from 01/28/2015 FINDINGS: VASCULAR Aorta: Abdominal aorta demonstrates mild irregular soft atherosclerotic plaque in the juxtarenal location. No aneurysmal dilatation or dissection is seen. 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 Renals: Both renal arteries are patent without evidence of aneurysm, dissection, vasculitis, fibromuscular dysplasia or significant stenosis. IMA: Patent without evidence of aneurysm, dissection, vasculitis or significant stenosis. RIGHT Lower Extremity Inflow: Right common and external iliac artery are within normal limits. Runoff: Common femoral artery and femoral bifurcation are widely patent. The superficial femoral and popliteal arteries on the right are within normal limits. Popliteal trifurcation is widely patent on the right with 2 vessel runoff into the foot. The peroneal artery continues to just above the ankle. LEFT Lower Extremity Inflow: Mild calcified plaque is noted at the origin of left common iliac artery. The common iliac artery and external iliac artery are widely patent. Runoff: Common femoral artery and femoral bifurcation are within normal limits. Superficial femoral and popliteal artery are unremarkable. Popliteal trifurcation is within normal limits. Two vessel runoff to the ankle is noted via the anterior tibial and peroneal artery. The posterior  tibial artery is occluded distally. Veins: The inferior vena cava is slit like and may be secondary to chronic occlusion. This would correspond with the known history of prior deep venous thrombosis. The venous structures on the left are slightly more prominent than that on the right consistent with the known chronic thrombus. Review of the MIP images confirms the above findings. NON-VASCULAR Lower chest: Mild basilar atelectasis is noted in the left lower lobe. Hepatobiliary: No focal liver abnormality is seen. No gallstones, gallbladder wall thickening, or biliary dilatation. Pancreas: Pancreas is well visualized. A focal hypodensity is noted in the midportion of the pancreas best seen on image number 39 of series 4 measuring 7.6 mm. This is stable in appearance in retrospect dating back to 2016. Spleen: Normal in size without focal abnormality. Adrenals/Urinary Tract: Adrenal glands are within normal limits. Kidneys demonstrate a normal enhancement pattern bilaterally. No renal calculi are seen. Hypodensity in the left kidney is noted consistent with a 1 cm cyst. No obstructive changes are noted. The bladder is partially distended. Stomach/Bowel: Scattered fecal material is noted throughout the colon without obstructive or inflammatory changes. The appendix is air-filled and within normal limits. No inflammatory changes are noted. Stomach and small bowel are  unremarkable. Lymphatic: No significant lymphadenopathy is noted. Reproductive: Status post hysterectomy. No adnexal masses. Other: No abdominal wall hernia or abnormality. No abdominopelvic ascites. Musculoskeletal: Degenerative changes of lumbar spine are noted. No bony abnormality in the lower extremities is seen. Generalized soft tissue swelling is noted in the left foot and left lower leg consistent with cellulitis. No focal wound is noted. No underlying muscular abnormality is seen. IMPRESSION: VASCULAR Irregular soft atherosclerotic plaque in the  abdominal aorta in the juxtarenal location. This is not flow limiting. 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 Lower extremity runoff is within normal limits with 2 vessel runoff to the feet bilaterally as described. No significant stenosis is noted. 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 This would correspond with the known history of lower extremity deep venous thrombosis. This is new from 2016. NON-VASCULAR Diffuse soft tissue swelling in the left lower extremity consistent with the given clinical history. These changes are felt to represent cellulitis. No skin ulcer is seen. No underlying bony or muscular abnormality is seen. Changes suggestive of mild constipation. Chronic changes within the abdomen and pelvis similar to that seen on prior exams. Electronically Signed   By: Inez Catalina M.D.   On: 12/05/2020 21:41   US Venous Img Lower Unilateral Left  Result Date: 12/05/2020 CLINICAL DATA:  Left leg pain and swelling for 2 days EXAM: LEFT LOWER EXTREMITY VENOUS DOPPLER ULTRASOUND TECHNIQUE: Gray-scale sonography with graded compression, as well as color Doppler and duplex ultrasound were performed to evaluate the lower extremity deep venous systems from the level of the common femoral vein and including the common femoral, femoral, profunda femoral, popliteal and calf veins including the posterior tibial, peroneal and gastrocnemius veins when visible. The superficial great saphenous vein was also interrogated. Spectral Doppler was utilized to evaluate flow at rest and with distal augmentation maneuvers in the common femoral, femoral and popliteal veins. COMPARISON:  11/07/2020 FINDINGS: Contralateral Common Femoral Vein: Nonocclusive thrombus is noted with decreased compressibility. Common Femoral Vein: Nonocclusive thrombus is noted with decreased compressibility. Saphenofemoral Junction: Nonocclusive thrombus is noted with  decreased compressibility. Profunda Femoral Vein: No evidence of thrombus. Normal compressibility and flow on color Doppler imaging. Femoral Vein: Nonocclusive thrombus is noted with decreased compressibility. Popliteal Vein: Nonocclusive thrombus is noted with decreased compressibility. Calf Veins: Nonocclusive thrombus is noted with decreased compressibility. Peroneal vein is not well visualized. Superficial Great Saphenous Vein: No evidence of thrombus. Normal compressibility. Venous Reflux:  None. Other Findings:  Calf edema is noted IMPRESSION: Diffuse nonocclusive thrombus in the visualized venous structures. The overall appearance is similar to that seen on prior ultrasound from 11/07/2020. No definitive acute thrombus is noted. Electronically Signed   By: Inez Catalina M.D.   On: 12/05/2020 19:13        Scheduled Meds:  atorvastatin  40 mg Oral Daily   baclofen  5 mg Per Tube TID   hydrALAZINE  10 mg Oral TID   ketorolac  15 mg Intravenous Q6H   levETIRAcetam  1,500 mg Oral BID   mirtazapine  7.5 mg Oral QHS   pantoprazole  20 mg Oral Daily   polyethylene glycol powder  17 g Oral Daily   rivaroxaban  10 mg Oral QPM   Continuous Infusions:  sodium chloride 75 mL/hr at 12/06/20 0959   cefTRIAXone (ROCEPHIN)  IV       LOS: 1 day    Time spent: 25 minutes  Sidney Ace, MD [facility] Pager [phone]  If 7PM-7AM, please contact night-coverage 12/06/2020, 10:20 AM

## 2020-12-06 NOTE — ED Notes (Signed)
Attempted to get pt up and go to bathroom, pt too weak to take step with L leg, bed sheets noted to be wet, 'I probably did pee.' pt assisted back to bed, purewick placed, pt changed into gown

## 2020-12-06 NOTE — ED Notes (Signed)
Pt denies any needs at this time.

## 2020-12-07 ENCOUNTER — Ambulatory Visit (INDEPENDENT_AMBULATORY_CARE_PROVIDER_SITE_OTHER): Payer: Medicare Other | Admitting: Nurse Practitioner

## 2020-12-07 MED ORDER — AMLODIPINE BESYLATE 5 MG PO TABS: 5.0000 mg | ORAL_TABLET | Freq: Every day | ORAL | Status: DC

## 2020-12-07 MED ORDER — AMLODIPINE BESYLATE 10 MG PO TABS
10.0000 mg | ORAL_TABLET | Freq: Every day | ORAL | Status: DC
  Administered 2020-12-09 – 2020-12-12 (×3): 10 mg via ORAL
  Filled 2020-12-07 (×5): qty 1

## 2020-12-07 MED ORDER — HYDRALAZINE HCL 20 MG/ML IJ SOLN
10.0000 mg | Freq: Once | INTRAMUSCULAR | Status: AC
  Administered 2020-12-07: 10 mg via INTRAVENOUS
  Filled 2020-12-07: qty 1

## 2020-12-07 NOTE — TOC Initial Note (Signed)
Transition of Care [facility]) - Initial/Assessment Note    Patient Details  Name: Valerie Bell MRN: [id number] Date of Birth: [birth year]  [facility]) CM/SW Contact:    Candie Chroman, LCSW Phone Number: 12/07/2020, 11:50 AM  Clinical Narrative:  Readmission prevention screen complete. Patient not fully oriented. Son at bedside. CSW introduced role and explained that discharge planning would be discussed. PCP is [facility] in [place]. Son transports her to appointments. [facility] [place] and [facility] [address]. No issues obtaining medications. Patient has a walker and wheelchair at home. Son requesting shower chair. Ordered 3-in-1 through [place]. Patient previously living with roommate but son said plan is now for her to go home with him at discharge: [address], [place], [place] [address]. Left message for [facility] representative to notify. Son asking about HCPOA paperwork. Patient currently AOx2. Explained that chaplin won't be able to complete paperwork until she's AOx4. Asked RN to consult them if she becomes fully oriented. No further concerns. CSW encouraged patient's son to contact CSW as needed. CSW will continue to follow patient and her son for support and facilitate discharge home with son once medically stable.              Expected Discharge Plan: [place] Barriers to Discharge: Continued Medical Work up   Patient Goals and CMS Choice Patient states their goals for this hospitalization and ongoing recovery are:: Patient not fully oriented   Choice offered to / list presented to : NA  Expected Discharge Plan and Services Expected Discharge Plan: [place] Acute Care Choice: Resumption of Svcs/PTA Provider, Durable Medical Equipment Living arrangements for the past 2 months: Single Family Home                 DME Arranged: 3-N-1 DME Agency: [employer] Date DME  Agency Contacted: 12/07/20   Representative spoke with at DME Agency: Suanne Marker HH Arranged: RN, PT, Speech Therapy [place] Agency: [place] Date [place]: 12/06/20   Representative spoke with at [place]: Sharmon Revere  Prior Living Arrangements/Services Living arrangements for the past 2 months: [place] Lives with:: Roommate Patient language and need for interpreter reviewed:: Yes Do you feel safe going back to the place where you live?: Yes      Need for Family Participation in Patient Care: Yes (Comment) Care giver support system in place?: Yes (comment) Current home services: DME, Home PT, Home RN, Other (comment) (Home Speech therapy) Criminal Activity/Legal Involvement Pertinent to Current Situation/Hospitalization: No - Comment as needed  Activities of Daily Living Home Assistive Devices/Equipment: Shower chair without back, Environmental consultant (specify type) ADL Screening (condition at time of admission) Patient's cognitive ability adequate to safely complete daily activities?: No Is the patient deaf or have difficulty hearing?: No Does the patient have difficulty seeing, even when wearing glasses/contacts?: No Does the patient have difficulty concentrating, remembering, or making decisions?: Yes Patient able to express need for assistance with ADLs?: Yes Does the patient have difficulty dressing or bathing?: Yes Independently performs ADLs?: No Communication: Independent Dressing (OT): Needs assistance Is this a change from baseline?: Pre-admission baseline Grooming: Needs assistance Is this a change from baseline?: Pre-admission baseline Feeding: Independent Bathing: Needs assistance Is this a change from baseline?: Pre-admission baseline Toileting: Independent In/Out Bed: Independent with device (comment) Walks in Home: Independent with device (comment) Does  the patient have difficulty walking or climbing stairs?: Yes Weakness of Legs:  Both Weakness of Arms/Hands: None  Permission Sought/Granted Permission sought to share information with : Facility Sport and exercise psychologist, Family Supports    Share Information with NAME: Alexzia Kasler  Permission granted to share info w AGENCY: [place] granted to share info w Relationship: Son  Permission granted to share info w Contact Information: [phone]  Emotional Assessment Appearance:: Appears stated age Attitude/Demeanor/Rapport: Unable to Assess Affect (typically observed): Unable to Assess Orientation: : Oriented to Self, Oriented to Place Alcohol / Substance Use: Not Applicable Psych Involvement: No (comment)  Admission diagnosis:  Cellulitis of left lower extremity [L03.116] Left leg cellulitis [L03.116] Patient Active Problem List   Diagnosis Date Noted   Left leg cellulitis 12/05/2020   UTI (urinary tract infection) 11/17/2020   Spinal cord lesion (HCC)    Stroke (HCC)    Thrombocytopenia (HCC)    Sacral pressure ulcer    Depression    AKI (acute kidney injury) ([employer])    Normocytic anemia 11/09/2020   Dyslipidemia 07/08/2019   Acute deep vein thrombosis (DVT) of left lower extremity ([place]) 06/20/2019   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[place]) 06/20/2019   Gastrostomy tube in place ([place]) 06/20/2019   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   PRES (posterior reversible encephalopathy syndrome) 02/24/2019   Dysphagia 02/20/2019   Chronic deep vein thrombosis (DVT) of femoral vein of right lower extremity ([place]) 09/04/2018   Pressure injury of skin 33/38/3291   Acute metabolic encephalopathy 91/66/0600   Encephalitis 08/27/2018   Benign essential HTN 08/27/2018   Leucocytosis 08/27/2018   Lupus ([place]) 08/27/2018   DVT (deep vein thrombosis) in pregnancy 08/27/2018   Lethargy 08/24/2018   Weakness 01/07/2018   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), [place] [address] Time ([place]) [place] [address] Phone: [phone] Fax: [phone]  [id number] [place] [place] [id number]: [phone]: [phone]    Social Determinants of Health (SDOH) Interventions    Readmission Risk Interventions Readmission Risk Prevention Plan 12/07/2020  Transportation Screening Complete  PCP or Specialist Appt within 5-7 Days Complete  Home Care Screening Complete  Medication Review (RN CM) Complete  Some recent data might be hidden

## 2020-12-07 NOTE — Progress Notes (Signed)
Patient refused to interact with me and take medications. IV rocephin was the only medication given.  Will continue to monitor.  Christene Slates  12/07/2020  2:50 AM

## 2020-12-07 NOTE — Progress Notes (Signed)
PROGRESS NOTE    Valerie Bell  ZHG:[id number] DOB: August 21, 1965 DOA: 12/05/2020 PCP: [facility]   Brief Narrative: 55 y.o. female with medical history significant for hypertension, systemic lupus erythematosus, CVA and DVT, who presented to the emergency room with acute onset of worsening left lower extremity erythema and swelling with associated warmth and pain.  The patient was recently admitted for acute DVT of  both lower extremities for which she was started on Xarelto.  No chest pain or palpitations.  No cough or wheezing or hemoptysis.  No nausea or vomiting or abdominal pain.  Imaging revealed no thrombus.  Patient does have evidence of vascular disease however lower extremity runoff is normal.  No significant lower extremity stenosis is noted.  Plain film x-ray with significant soft tissue swelling with no concern for acute osteomyelitis.   Assessment & Plan:   Active Problems:   Left leg cellulitis  Left lower extremity nonpurulent cellulitis Likely underlying clot burden is contributing Patient is hemodynamically stable Significant swelling of left lower extremity X-ray negative for fracture or dislocation.  Low concern for osteomyelitis Plan: Continue IV Rocephin for now Follow blood cultures, no growth to date If patient's clinical condition deteriorates we will pursue MRI to rule out underlying osteomyelitis  Right lower extremity DVT Clot burden appears stable on imaging No concern for acute vascular obstruction Continue Xarelto  Essential hypertension PTA amlodipine and hydralazine  Hyperlipidemia PTA statin  GERD PPI   DVT prophylaxis: Xarelto Code Status: Full Family Communication: None today Disposition Plan: Status is: Inpatient  Remains inpatient appropriate because:Inpatient level of care appropriate due to severity of illness  Dispo: The patient is from: Home              Anticipated d/c is to: Home              Patient  currently is not medically stable to d/c.   Difficult to place patient No  Moderate to severe lower extremity cellulitis.  On IV antibiotics.  Will need a few days prior to disposition.     Level of care: Med-Surg  Consultants:  None  Procedures:  None  Antimicrobials:  Ceftriaxone   Subjective: Patient seen and examined.  Continues to endorse pain and swelling of left lower extremity.  No other complaints.  Objective: Vitals:   12/06/20 1524 12/06/20 1920 12/07/20 0515 12/07/20 0749  BP: 120/80 114/76 (!) 128/94 (!) 141/83  Pulse: 75 69 76 71  Resp: 18 15 15 14   Temp: 98.7 F (37.1 C) 98 F (36.7 C) 98 F (36.7 C) 98 F (36.7 C)  TempSrc:    Oral  SpO2: 100% 98% 100% 99%  Weight:      Height:        Intake/Output Summary (Last 24 hours) at 12/07/2020 1120 Last data filed at 12/07/2020 0837 Gross per 24 hour  Intake 2363.42 ml  Output 500 ml  Net 1863.42 ml   Filed Weights   12/05/20 1758  Weight: 59 kg    Examination:  General exam: No acute distress Respiratory system: Clear to auscultation. Respiratory effort normal. Cardiovascular system: S1 & S2 heard, RRR. No JVD, murmurs, rubs, gallops or clicks. No pedal edema. Gastrointestinal system: Abdomen is nondistended, soft and nontender. No organomegaly or masses felt. Normal bowel sounds heard. Central nervous system: Alert and oriented. No focal neurological deficits. Extremities:   Left lower extremity swollen, tender to touch, decreased range of motion Skin: Cellulitic changes of left lower  extremity Psychiatry: Judgement and insight appear impaired. Mood & affect flattened.     Data Reviewed: I have personally reviewed following labs and imaging studies  CBC: Recent Labs  Lab 12/05/20 1844 12/06/20 0638  WBC 13.3* 12.1*  NEUTROABS 11.9*  --   HGB 11.7* 11.3*  HCT 36.6 34.2*  MCV 96.8 95.0  PLT 87* 60*   Basic Metabolic Panel: Recent Labs  Lab 12/05/20 1844 12/06/20 0638  NA 135  135  K 4.4 4.0  CL 102 103  CO2 26 23  GLUCOSE 118* 85  BUN 15 14  CREATININE 0.86 0.78  CALCIUM 9.4 8.9   GFR: Estimated Creatinine Clearance: 69.4 mL/min (by C-G formula based on SCr of 0.78 mg/dL). Liver Function Tests: Recent Labs  Lab 12/05/20 1844  AST 27  ALT 22  ALKPHOS 59  BILITOT 0.7  PROT 9.0*  ALBUMIN 3.6   Recent Labs  Lab 12/05/20 1844  LIPASE 30   Recent Labs  Lab 12/05/20 2006  AMMONIA 13   Coagulation Profile: Recent Labs  Lab 12/05/20 1940 12/05/20 2006  INR 1.1 1.0   Cardiac Enzymes: No results for input(s): CKTOTAL, CKMB, CKMBINDEX, TROPONINI in the last 168 hours. BNP (last 3 results) No results for input(s): PROBNP in the last 8760 hours. HbA1C: No results for input(s): HGBA1C in the last 72 hours. CBG: No results for input(s): GLUCAP in the last 168 hours. Lipid Profile: No results for input(s): CHOL, HDL, LDLCALC, TRIG, CHOLHDL, LDLDIRECT in the last 72 hours. Thyroid Function Tests: No results for input(s): TSH, T4TOTAL, FREET4, T3FREE, THYROIDAB in the last 72 hours. Anemia Panel: No results for input(s): VITAMINB12, FOLATE, FERRITIN, TIBC, IRON, RETICCTPCT in the last 72 hours. Sepsis Labs: No results for input(s): PROCALCITON, LATICACIDVEN in the last 168 hours.  Recent Results (from the past 240 hour(s))  Resp Panel by RT-PCR (Flu A&B, Covid) Nasopharyngeal Swab     Status: None   Collection Time: 12/05/20 10:30 PM   Specimen: Nasopharyngeal Swab; Nasopharyngeal(NP) swabs in vial transport medium  Result Value Ref Range Status   SARS Coronavirus 2 by RT PCR NEGATIVE NEGATIVE Final    Comment: (NOTE) SARS-CoV-2 target nucleic acids are NOT DETECTED.  The SARS-CoV-2 RNA is generally detectable in upper respiratory specimens during the acute phase of infection. The lowest concentration of SARS-CoV-2 viral copies this assay can detect is 138 copies/mL. A negative result does not preclude SARS-Cov-2 infection and should not  be used as the sole basis for treatment or other patient management decisions. A negative result may occur with  improper specimen collection/handling, submission of specimen other than nasopharyngeal swab, presence of viral mutation(s) within the areas targeted by this assay, and inadequate number of viral copies(<138 copies/mL). A negative result must be combined with clinical observations, patient history, and epidemiological information. The expected result is Negative.  Fact Sheet for Patients:  EntrepreneurPulse.com.au  Fact Sheet for Healthcare Providers:  IncredibleEmployment.be  This test is no t yet approved or cleared by the [place] FDA and  has been authorized for detection and/or diagnosis of SARS-CoV-2 by FDA under an Emergency Use Authorization (EUA). This EUA will remain  in effect (meaning this test can be used) for the duration of the COVID-19 declaration under Section 564(b)(1) of the Act, 21 U.S.C.section 360bbb-3(b)(1), unless the authorization is terminated  or revoked sooner.       Influenza A by PCR NEGATIVE NEGATIVE Final   Influenza B by PCR NEGATIVE NEGATIVE Final  Comment: (NOTE) The Xpert Xpress SARS-CoV-2/FLU/RSV plus assay is intended as an aid in the diagnosis of influenza from Nasopharyngeal swab specimens and should not be used as a sole basis for treatment. Nasal washings and aspirates are unacceptable for Xpert Xpress SARS-CoV-2/FLU/RSV testing.  Fact Sheet for Patients: EntrepreneurPulse.com.au  Fact Sheet for Healthcare Providers: IncredibleEmployment.be  This test is not yet approved or cleared by the [place] FDA and has been authorized for detection and/or diagnosis of SARS-CoV-2 by FDA under an Emergency Use Authorization (EUA). This EUA will remain in effect (meaning this test can be used) for the duration of the COVID-19 declaration under Section  564(b)(1) of the Act, 21 U.S.C. section 360bbb-3(b)(1), unless the authorization is terminated or revoked.  Performed at [facility], [address] [place], [place] [address]          Radiology Studies: DG Tibia/Fibula Left  Result Date: 12/06/2020 CLINICAL DATA:  Swelling to the left lower extremity. EXAM: LEFT TIBIA AND FIBULA - 2 VIEW COMPARISON:  None. FINDINGS: No acute or suspicious bony abnormality in the left kidney or fibula. Diffuse soft tissue edema/swelling evident. IMPRESSION: No acute bony abnormality. Electronically Signed   By: Misty Stanley M.D.   On: 12/06/2020 10:50   CT Angio Aortobifemoral W and/or Wo Contrast  Result Date: 12/05/2020 CLINICAL DATA:  Left lower extremity pain and swelling with known history of nonocclusive deep venous thrombosis and lower extremity edema. EXAM: CT ANGIOGRAPHY OF ABDOMINAL AORTA WITH ILIOFEMORAL RUNOFF TECHNIQUE: Multidetector CT imaging of the abdomen, pelvis and lower extremities was performed using the standard protocol during bolus administration of intravenous contrast. Multiplanar CT image reconstructions and MIPs were obtained to evaluate the vascular anatomy. CONTRAST:  118mL OMNIPAQUE IOHEXOL 350 MG/ML SOLN COMPARISON:  CT from 01/28/2015 FINDINGS: VASCULAR Aorta: Abdominal aorta demonstrates mild irregular soft atherosclerotic plaque in the juxtarenal location. No aneurysmal dilatation or dissection is seen. 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 Renals: Both renal arteries are patent without evidence of aneurysm, dissection, vasculitis, fibromuscular dysplasia or significant stenosis. IMA: Patent without evidence of aneurysm, dissection, vasculitis or significant stenosis. RIGHT Lower Extremity Inflow: Right common and external iliac artery are within normal limits. Runoff: Common femoral artery and femoral  bifurcation are widely patent. The superficial femoral and popliteal arteries on the right are within normal limits. Popliteal trifurcation is widely patent on the right with 2 vessel runoff into the foot. The peroneal artery continues to just above the ankle. LEFT Lower Extremity Inflow: Mild calcified plaque is noted at the origin of left common iliac artery. The common iliac artery and external iliac artery are widely patent. Runoff: Common femoral artery and femoral bifurcation are within normal limits. Superficial femoral and popliteal artery are unremarkable. Popliteal trifurcation is within normal limits. Two vessel runoff to the ankle is noted via the anterior tibial and peroneal artery. The posterior tibial artery is occluded distally. Veins: The inferior vena cava is slit like and may be secondary to chronic occlusion. This would correspond with the known history of prior deep venous thrombosis. The venous structures on the left are slightly more prominent than that on the right consistent with the known chronic thrombus. Review of the MIP images confirms the above findings. NON-VASCULAR Lower chest: Mild basilar atelectasis is noted in the left lower lobe. Hepatobiliary: No focal liver abnormality is seen. No gallstones, gallbladder wall thickening, or biliary dilatation. Pancreas: Pancreas  is well visualized. A focal hypodensity is noted in the midportion of the pancreas best seen on image number 39 of series 4 measuring 7.6 mm. This is stable in appearance in retrospect dating back to 2016. Spleen: Normal in size without focal abnormality. Adrenals/Urinary Tract: Adrenal glands are within normal limits. Kidneys demonstrate a normal enhancement pattern bilaterally. No renal calculi are seen. Hypodensity in the left kidney is noted consistent with a 1 cm cyst. No obstructive changes are noted. The bladder is partially distended. Stomach/Bowel: Scattered fecal material is noted throughout the colon without  obstructive or inflammatory changes. The appendix is air-filled and within normal limits. No inflammatory changes are noted. Stomach and small bowel are unremarkable. Lymphatic: No significant lymphadenopathy is noted. Reproductive: Status post hysterectomy. No adnexal masses. Other: No abdominal wall hernia or abnormality. No abdominopelvic ascites. Musculoskeletal: Degenerative changes of lumbar spine are noted. No bony abnormality in the lower extremities is seen. Generalized soft tissue swelling is noted in the left foot and left lower leg consistent with cellulitis. No focal wound is noted. No underlying muscular abnormality is seen. IMPRESSION: VASCULAR Irregular soft atherosclerotic plaque in the abdominal aorta in the juxtarenal location. This is not flow limiting. 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 Lower extremity runoff is within normal limits with 2 vessel runoff to the feet bilaterally as described. No significant stenosis is noted. 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 This would correspond with the known history of lower extremity deep venous thrombosis. This is new from 2016. NON-VASCULAR Diffuse soft tissue swelling in the left lower extremity consistent with the given clinical history. These changes are felt to represent cellulitis. No skin ulcer is seen. No underlying bony or muscular abnormality is seen. Changes suggestive of mild constipation. Chronic changes within the abdomen and pelvis similar to that seen on prior exams. Electronically Signed   By: Inez Catalina M.D.   On: 12/05/2020 21:41   US Venous Img Lower Unilateral Left  Result Date: 12/05/2020 CLINICAL DATA:  Left leg pain and swelling for 2 days EXAM: LEFT LOWER EXTREMITY VENOUS DOPPLER ULTRASOUND TECHNIQUE: Gray-scale sonography with graded compression, as well as color Doppler and duplex ultrasound were performed to evaluate the lower extremity  deep venous systems from the level of the common femoral vein and including the common femoral, femoral, profunda femoral, popliteal and calf veins including the posterior tibial, peroneal and gastrocnemius veins when visible. The superficial great saphenous vein was also interrogated. Spectral Doppler was utilized to evaluate flow at rest and with distal augmentation maneuvers in the common femoral, femoral and popliteal veins. COMPARISON:  11/07/2020 FINDINGS: Contralateral Common Femoral Vein: Nonocclusive thrombus is noted with decreased compressibility. Common Femoral Vein: Nonocclusive thrombus is noted with decreased compressibility. Saphenofemoral Junction: Nonocclusive thrombus is noted with decreased compressibility. Profunda Femoral Vein: No evidence of thrombus. Normal compressibility and flow on color Doppler imaging. Femoral Vein: Nonocclusive thrombus is noted with decreased compressibility. Popliteal Vein: Nonocclusive thrombus is noted with decreased compressibility. Calf Veins: Nonocclusive thrombus is noted with decreased compressibility. Peroneal vein is not well visualized. Superficial Great Saphenous Vein: No evidence of thrombus. Normal compressibility. Venous Reflux:  None. Other Findings:  Calf edema is noted IMPRESSION: Diffuse nonocclusive thrombus in the visualized venous structures. The overall appearance is similar to that seen on prior ultrasound from 11/07/2020. No definitive acute thrombus is noted. Electronically Signed   By: Inez Catalina M.D.   On: 12/05/2020 19:13  DG Foot 2 Views Left  Result Date: 12/06/2020 CLINICAL DATA:  LEFT foot pain and swelling. EXAM: LEFT FOOT - 2 VIEW COMPARISON:  None. FINDINGS: There is no evidence of fracture, subluxation or dislocation. No focal bony lesions are present. Diffuse soft tissue swelling is noted. IMPRESSION: Diffuse soft tissue swelling without bony abnormality. Electronically Signed   By: Margarette Canada M.D.   On: 12/06/2020 10:35         Scheduled Meds:  atorvastatin  40 mg Oral Daily   baclofen  5 mg Per Tube TID   hydrALAZINE  10 mg Oral TID   ketorolac  15 mg Intravenous Q6H   levETIRAcetam  1,500 mg Oral BID   mirtazapine  7.5 mg Oral QHS   pantoprazole  20 mg Oral Daily   polyethylene glycol powder  17 g Oral Daily   rivaroxaban  10 mg Oral QPM   Continuous Infusions:  sodium chloride 75 mL/hr at 12/07/20 0527   cefTRIAXone (ROCEPHIN)  IV Stopped (12/06/20 2140)     LOS: 2 days    Time spent: 25 minutes    Sidney Ace, MD [facility] Pager [phone]  If 7PM-7AM, please contact night-coverage 12/07/2020, 11:20 AM

## 2020-12-08 ENCOUNTER — Inpatient Hospital Stay: Payer: Medicare Other

## 2020-12-08 DIAGNOSIS — G9341 Metabolic encephalopathy: Secondary | ICD-10-CM

## 2020-12-08 LAB — CBC
HCT: 34.5 % — ABNORMAL LOW (ref 36.0–46.0)
Hemoglobin: 11.5 g/dL — ABNORMAL LOW (ref 12.0–15.0)
MCH: 31.3 pg (ref 26.0–34.0)
MCHC: 33.3 g/dL (ref 30.0–36.0)
MCV: 93.8 fL (ref 80.0–100.0)
Platelets: 130 10*3/uL — ABNORMAL LOW (ref 150–400)
RBC: 3.68 MIL/uL — ABNORMAL LOW (ref 3.87–5.11)
RDW: 12.8 % (ref 11.5–15.5)
WBC: 10 10*3/uL (ref 4.0–10.5)
nRBC: 0 % (ref 0.0–0.2)

## 2020-12-08 NOTE — Progress Notes (Signed)
Patient refused to take all medications and will not interact at all.  It you try to move her, she becomes combative.  Will continue to monitor.  Christene Slates

## 2020-12-08 NOTE — Progress Notes (Signed)
PT Cancellation Note  Patient Details Name: Valerie Bell MRN: [id number] DOB: Mar 27, 1966   Cancelled Treatment:    Reason Eval/Treat Not Completed: Fatigue/lethargy limiting ability to participate. Patient sleeping, unable to rouse to participate with therapy. Will return at later day/time for evaluation.     Lynden Carrithers 12/08/2020, 1:08 PM

## 2020-12-08 NOTE — Progress Notes (Signed)
OT Cancellation Note  Patient Details Name: Valerie Bell MRN: [id number] DOB: [birth year]   Cancelled Treatment:    Reason Eval/Treat Not Completed: Fatigue/lethargy limiting ability to participate. Orders received and chart reviewed. Upon arrival to room, pt sleeping soundly. Pt able to be awoken, however unable to remain alert for >5 sec following multiple attempts to improve alertness following touch and warm washcloth applied to face. OT to re-attempt at later time/date as able.  Fredirick Maudlin, OTR/L [place]

## 2020-12-08 NOTE — Progress Notes (Signed)
VS refused by patient. Also attempted to remove PIV in right arm which is currently SL. Pt refused removal of IV at this time.

## 2020-12-08 NOTE — Progress Notes (Signed)
PROGRESS NOTE    BANEZA BARTOSZEK  WUJ:[id number] DOB: Jan 12, 1966 DOA: 12/05/2020 PCP: [facility]   Brief Narrative: 55 y.o. female with medical history significant for hypertension, systemic lupus erythematosus, CVA and DVT, who presented to the emergency room with acute onset of worsening left lower extremity erythema and swelling with associated warmth and pain.  The patient was recently admitted for acute DVT of  both lower extremities for which she was started on Xarelto.  No chest pain or palpitations.  No cough or wheezing or hemoptysis.  No nausea or vomiting or abdominal pain. Imaging revealed no thrombus.  Patient does have evidence of vascular disease however lower extremity runoff is normal.  No significant lower extremity stenosis is noted. Plain film x-ray with significant soft tissue swelling with no concern for acute osteomyelitis.   Assessment & Plan:   Active Problems:   Left leg cellulitis  Left lower extremity nonpurulent cellulitis Likely underlying clot burden is contributing Significant swelling of left lower extremity Vascular ultrasound shows diffuse nonocclusive thrombosis seen on prior ultrasound, no acute thrombus noted X-ray negative for fracture or dislocation.  Low concern for osteomyelitis Continue IV Rocephin for now If patient's clinical condition deteriorates we will pursue MRI to rule out underlying osteomyelitis  ??  Acute metabolic encephalopathy Patient very lethargic, unable to carry out a conversation Per note from 11/17/2020, patient is conversant UA/UC pending collection, chest x-ray ordered, blood culture ordered CT head pending Monitor closely  Right lower extremity DVT Clot burden appears stable on imaging No concern for acute vascular obstruction Continue Xarelto  Essential hypertension PTA amlodipine and hydralazine  Hyperlipidemia PTA statin  GERD PPI   DVT prophylaxis: Xarelto Code Status: Full Family  Communication: None today Disposition Plan: Status is: Inpatient  Remains inpatient appropriate because:Inpatient level of care appropriate due to severity of illness  Dispo: The patient is from: Home              Anticipated d/c is to: Home              Patient currently is not medically stable to d/c.   Difficult to place patient No    Level of care: Med-Surg  Consultants:  None  Procedures:  None  Antimicrobials:  Ceftriaxone   Subjective: Patient seen and examined at bedside, appears very lethargic, unable to carry out a conversation.  Was reported that patient has been this way for the past 2 days.  Objective: Vitals:   12/08/20 0122 12/08/20 0317 12/08/20 0747 12/08/20 1110  BP: (!) 162/98 (!) 150/85 (!) 158/95 128/86  Pulse: 83 88 74 66  Resp:  16 18 16   Temp:  99.4 F (37.4 C) 98.1 F (36.7 C) 98.9 F (37.2 C)  TempSrc:  Axillary  Oral  SpO2:  100% 100% 100%  Weight:      Height:        Intake/Output Summary (Last 24 hours) at 12/08/2020 1855 Last data filed at 12/08/2020 0400 Gross per 24 hour  Intake 1687.8 ml  Output 600 ml  Net 1087.8 ml   Filed Weights   12/05/20 1758  Weight: 59 kg    Examination: General: NAD, appears lethargic Cardiovascular: S1, S2 present Respiratory: CTAB Abdomen: Soft, nontender, nondistended, bowel sounds present Musculoskeletal: LLE swollen, TTP Skin: LLE erythema, warmth, tender to touch Psychiatry: Unable to assess    Data Reviewed: I have personally reviewed following labs and imaging studies  CBC: Recent Labs  Lab 12/05/20 1844 12/06/20  1610 12/08/20 0518  WBC 13.3* 12.1* 10.0  NEUTROABS 11.9*  --   --   HGB 11.7* 11.3* 11.5*  HCT 36.6 34.2* 34.5*  MCV 96.8 95.0 93.8  PLT 87* 60* 960*   Basic Metabolic Panel: Recent Labs  Lab 12/05/20 1844 12/06/20 0638  NA 135 135  K 4.4 4.0  CL 102 103  CO2 26 23  GLUCOSE 118* 85  BUN 15 14  CREATININE 0.86 0.78  CALCIUM 9.4 8.9   GFR: Estimated  Creatinine Clearance: 69.4 mL/min (by C-G formula based on SCr of 0.78 mg/dL). Liver Function Tests: Recent Labs  Lab 12/05/20 1844  AST 27  ALT 22  ALKPHOS 59  BILITOT 0.7  PROT 9.0*  ALBUMIN 3.6   Recent Labs  Lab 12/05/20 1844  LIPASE 30   Recent Labs  Lab 12/05/20 2006  AMMONIA 13   Coagulation Profile: Recent Labs  Lab 12/05/20 1940 12/05/20 2006  INR 1.1 1.0   Cardiac Enzymes: No results for input(s): CKTOTAL, CKMB, CKMBINDEX, TROPONINI in the last 168 hours. BNP (last 3 results) No results for input(s): PROBNP in the last 8760 hours. HbA1C: No results for input(s): HGBA1C in the last 72 hours. CBG: No results for input(s): GLUCAP in the last 168 hours. Lipid Profile: No results for input(s): CHOL, HDL, LDLCALC, TRIG, CHOLHDL, LDLDIRECT in the last 72 hours. Thyroid Function Tests: No results for input(s): TSH, T4TOTAL, FREET4, T3FREE, THYROIDAB in the last 72 hours. Anemia Panel: No results for input(s): VITAMINB12, FOLATE, FERRITIN, TIBC, IRON, RETICCTPCT in the last 72 hours. Sepsis Labs: No results for input(s): PROCALCITON, LATICACIDVEN in the last 168 hours.  Recent Results (from the past 240 hour(s))  Resp Panel by RT-PCR (Flu A&B, Covid) Nasopharyngeal Swab     Status: None   Collection Time: 12/05/20 10:30 PM   Specimen: Nasopharyngeal Swab; Nasopharyngeal(NP) swabs in vial transport medium  Result Value Ref Range Status   SARS Coronavirus 2 by RT PCR NEGATIVE NEGATIVE Final    Comment: (NOTE) SARS-CoV-2 target nucleic acids are NOT DETECTED.  The SARS-CoV-2 RNA is generally detectable in upper respiratory specimens during the acute phase of infection. The lowest concentration of SARS-CoV-2 viral copies this assay can detect is 138 copies/mL. A negative result does not preclude SARS-Cov-2 infection and should not be used as the sole basis for treatment or other patient management decisions. A negative result may occur with  improper specimen  collection/handling, submission of specimen other than nasopharyngeal swab, presence of viral mutation(s) within the areas targeted by this assay, and inadequate number of viral copies(<138 copies/mL). A negative result must be combined with clinical observations, patient history, and epidemiological information. The expected result is Negative.  Fact Sheet for Patients:  EntrepreneurPulse.com.au  Fact Sheet for Healthcare Providers:  IncredibleEmployment.be  This test is no t yet approved or cleared by the [place] FDA and  has been authorized for detection and/or diagnosis of SARS-CoV-2 by FDA under an Emergency Use Authorization (EUA). This EUA will remain  in effect (meaning this test can be used) for the duration of the COVID-19 declaration under Section 564(b)(1) of the Act, 21 U.S.C.section 360bbb-3(b)(1), unless the authorization is terminated  or revoked sooner.       Influenza A by PCR NEGATIVE NEGATIVE Final   Influenza B by PCR NEGATIVE NEGATIVE Final    Comment: (NOTE) The Xpert Xpress SARS-CoV-2/FLU/RSV plus assay is intended as an aid in the diagnosis of influenza from Nasopharyngeal swab specimens and should not  be used as a sole basis for treatment. Nasal washings and aspirates are unacceptable for Xpert Xpress SARS-CoV-2/FLU/RSV testing.  Fact Sheet for Patients: EntrepreneurPulse.com.au  Fact Sheet for Healthcare Providers: IncredibleEmployment.be  This test is not yet approved or cleared by the [place] FDA and has been authorized for detection and/or diagnosis of SARS-CoV-2 by FDA under an Emergency Use Authorization (EUA). This EUA will remain in effect (meaning this test can be used) for the duration of the COVID-19 declaration under Section 564(b)(1) of the Act, 21 U.S.C. section 360bbb-3(b)(1), unless the authorization is terminated or revoked.  Performed at [facility], [address] [place], [place] [address]          Radiology Studies: No results found.      Scheduled Meds:  amLODipine  10 mg Oral Daily   atorvastatin  40 mg Oral Daily   baclofen  5 mg Per Tube TID   hydrALAZINE  10 mg Oral TID   ketorolac  15 mg Intravenous Q6H   levETIRAcetam  1,500 mg Oral BID   mirtazapine  7.5 mg Oral QHS   pantoprazole  20 mg Oral Daily   polyethylene glycol powder  17 g Oral Daily   rivaroxaban  10 mg Oral QPM   Continuous Infusions:  cefTRIAXone (ROCEPHIN)  IV Stopped (12/07/20 2213)     LOS: 3 days     Alma Friendly, MD [facility]  If 7PM-7AM, please contact night-coverage 12/08/2020, 6:55 PM

## 2020-12-09 LAB — URINALYSIS, COMPLETE (UACMP) WITH MICROSCOPIC
Bilirubin Urine: NEGATIVE
Glucose, UA: NEGATIVE mg/dL
Hgb urine dipstick: NEGATIVE
Ketones, ur: 5 mg/dL — AB
Leukocytes,Ua: NEGATIVE
Nitrite: NEGATIVE
Protein, ur: NEGATIVE mg/dL
Specific Gravity, Urine: 1.025 (ref 1.005–1.030)
WBC, UA: NONE SEEN WBC/hpf (ref 0–5)
pH: 6.5 (ref 5.0–8.0)

## 2020-12-09 LAB — CBC WITH DIFFERENTIAL/PLATELET
Abs Immature Granulocytes: 0.06 10*3/uL (ref 0.00–0.07)
Basophils Absolute: 0 10*3/uL (ref 0.0–0.1)
Basophils Relative: 0 %
Eosinophils Absolute: 0.3 10*3/uL (ref 0.0–0.5)
Eosinophils Relative: 3 %
HCT: 39.9 % (ref 36.0–46.0)
Hemoglobin: 12.8 g/dL (ref 12.0–15.0)
Immature Granulocytes: 1 %
Lymphocytes Relative: 22 %
Lymphs Abs: 2 10*3/uL (ref 0.7–4.0)
MCH: 30.9 pg (ref 26.0–34.0)
MCHC: 32.1 g/dL (ref 30.0–36.0)
MCV: 96.4 fL (ref 80.0–100.0)
Monocytes Absolute: 0.5 10*3/uL (ref 0.1–1.0)
Monocytes Relative: 6 %
Neutro Abs: 6.5 10*3/uL (ref 1.7–7.7)
Neutrophils Relative %: 68 %
Platelets: 157 10*3/uL (ref 150–400)
RBC: 4.14 MIL/uL (ref 3.87–5.11)
RDW: 12.8 % (ref 11.5–15.5)
WBC: 9.4 10*3/uL (ref 4.0–10.5)
nRBC: 0 % (ref 0.0–0.2)

## 2020-12-09 LAB — BASIC METABOLIC PANEL
Anion gap: 12 (ref 5–15)
BUN: 11 mg/dL (ref 6–20)
CO2: 24 mmol/L (ref 22–32)
Calcium: 9.3 mg/dL (ref 8.9–10.3)
Chloride: 104 mmol/L (ref 98–111)
Creatinine, Ser: 0.9 mg/dL (ref 0.44–1.00)
GFR, Estimated: >60 mL/min (ref >60)
Glucose, Bld: 72 mg/dL (ref 70–99)
Potassium: 3.9 mmol/L (ref 3.5–5.1)
Sodium: 140 mmol/L (ref 135–145)

## 2020-12-09 LAB — PROCALCITONIN: Procalcitonin: <0.1 ng/mL

## 2020-12-09 MED ORDER — SODIUM CHLORIDE 0.9 % IV SOLN
INTRAVENOUS | Status: DC | PRN
  Administered 2020-12-09: 1000 mL via INTRAVENOUS

## 2020-12-09 MED ORDER — POLYETHYLENE GLYCOL 3350 17 G PO PACK
17.0000 g | PACK | Freq: Every day | ORAL | Status: DC
  Administered 2020-12-10: 17 g via ORAL
  Filled 2020-12-09: qty 1

## 2020-12-09 NOTE — Progress Notes (Signed)
Pt refusing IV start.  Pt up in chair.  Danae Chen RN notified.

## 2020-12-09 NOTE — Progress Notes (Signed)
Order received from Dr Horris Latino to discontinue toradol

## 2020-12-09 NOTE — Progress Notes (Signed)
Patient refused for the IV team to place her IV. I told her that it is hospital policy that all patients have an IV and that the MD wants the patient to have an IV. The patient is still refusing for me to place an IV

## 2020-12-09 NOTE — Evaluation (Signed)
Occupational Therapy Evaluation Patient Details Name: Valerie Bell MRN: [id number] DOB: 1965-07-26 Today's Date: 12/09/2020    History of Present Illness 55 y.o. female with medical history significant for hypertension, systemic lupus erythematosus, CVA and DVT, who presented to the emergency room with acute onset of worsening left lower extremity erythema and swelling with associated warmth and pain.  The patient was recently admitted for acute DVT of  both lower extremities for which she was started on Xarelto. Imaging revealed no thrombus.  Patient does have evidence of vascular disease however lower extremity runoff is normal.  No significant lower extremity stenosis is noted. Plain film x-ray with significant soft tissue swelling with no concern for acute osteomyelitis.   Clinical Impression   Pt was seen for OT evaluation and co-tx this date. Pt's partner [name] present for session and able to provide majority of home/PLOF information, as pt is very limited in verbal communication (unclear as to why). Per partner, prior to recent hospital admission/past 2 weeks, pt was ambulating with a rollator sometimes (otherwise furniture walking) short household distances, using a w/c for community, still driving. [name] reports she manages groceries, cleaning, and cooking, while son assists with medication mgt. Currently pt demonstrates impairments as described below (See OT problem list) which functionally limit her ability to perform ADL/self-care tasks. After max encouragement, pt required MIN-MOD A x2 for bed mobility and ADL transfers, MAX A for LB ADL, and set up/SUP for seated/bed level grooming and self feeding. LLE swollen, red, and painful per pt, with limited active DF/PF noted. Pt noted to decrease WBing through LLE. Pt would benefit from skilled OT services to address noted impairments and functional limitations (see below for any additional details) in order to maximize safety and  independence while minimizing falls risk and caregiver burden. Partner reports she would be willing/able to provide assist for transfers and her sister could also assist when partner is working 3rd shift. Goal is to return home. Upon hospital discharge, currently recommend STR to maximize pt safety and return to PLOF given significant increase in assist required for ADL/mobility.     Follow Up Recommendations  SNF    Equipment Recommendations  3 in 1 bedside commode    Recommendations for Other Services       Precautions / Restrictions Precautions Precautions: Fall Restrictions Weight Bearing Restrictions: No      Mobility Bed Mobility Overal bed mobility: Needs Assistance Bed Mobility: Supine to Sit     Supine to sit: Mod assist;HOB elevated     General bed mobility comments: MOD A for BLE (L>R) mgt, significant increased time    Transfers Overall transfer level: Needs assistance Equipment used: 2 person hand held assist Transfers: Sit to/from [facility] Sit to Stand: +2 physical assistance;Mod assist;Min assist Stand pivot transfers: +2 physical assistance;Mod assist       General transfer comment: decreased WBing on LLE 2/2 pain    Balance Overall balance assessment: Needs assistance Sitting-balance support: Bilateral upper extremity supported;Single extremity supported;Feet supported Sitting balance-Leahy Scale: Fair       Standing balance-Leahy Scale: Poor Standing balance comment: required assist to remain standing                           ADL either performed or assessed with clinical judgement   ADL Overall ADL's : Needs assistance/impaired  General ADL Comments: Pt attempted but ultimately required MAX A for LB dressing and MIN-MOD A x2 for ADL transfers, able to self feed and perform seated grooming tasks with set up     Vision Patient Visual Report: No change from  baseline Vision Assessment?: No apparent visual deficits     Perception     Praxis      Pertinent Vitals/Pain Pain Assessment: Faces Faces Pain Scale: Hurts little more Pain Location: LLE Pain Descriptors / Indicators: Aching;Grimacing Pain Intervention(s): Limited activity within patient's tolerance;Monitored during session;Repositioned     Hand Dominance     Extremity/Trunk Assessment Upper Extremity Assessment Upper Extremity Assessment: Generalized weakness   Lower Extremity Assessment Lower Extremity Assessment: Generalized weakness;LLE deficits/detail LLE Deficits / Details: very swollen and painful, limited ankle ROM       Communication Communication Communication: No difficulties   Cognition Arousal/Alertness: Awake/alert Behavior During Therapy: Flat affect;Agitated Overall Cognitive Status: Difficult to assess                                 General Comments: pt slow to process, limited verbal communication, cues and increased time to follow simple commands   General Comments  LLE swollen, red, worse distal to knee, RN aware    Exercises Other Exercises Other Exercises: With max encouragement, pt performed bed mobility and ADL transfers with OT/PT   Shoulder Instructions      Home Living Family/patient expects to be discharged to:: Private residence Living Arrangements: Spouse/significant other;Other (Comment) (partner [name] present and reports her sister is available when [name] is at work 3rd shift) Available Help at Discharge: Available 24 hours/day;Family Type of Home: House Home Access: [place]: One level     Bathroom Shower/Tub: Teacher, early years/pre: [place]: Environmental consultant - 4 wheels;Shower seat;Wheelchair - manual          Prior Functioning/Environment Level of Independence: Needs assistance  Gait / Transfers Assistance Needed: per Jennifer/pt, unable to walk for  past 2 wks 2/2 LLE swelling/pain, before then was using rollator sometimes in the home, other times holding onto furniture/walls for support;Jennifer reports using w/c for community ADL's / Homemaking Assistance Needed: [name] reports up until 2 weeks ago was indep with basic ADL, seated shower, and pt was driving; [name] assists with groceries, meals, cleaning; pt's son assists with medications   Comments: Partner [name] reports no falls in past 74mo, but recent admission pt endorses multiple recent falls.        OT Problem List: Decreased strength;Pain;Decreased cognition;Decreased range of motion;Decreased activity tolerance;Impaired balance (sitting and/or standing);Decreased knowledge of use of DME or AE      OT Treatment/Interventions: Self-care/ADL training;Therapeutic exercise;Therapeutic activities;DME and/or AE instruction;Patient/family education;Balance training;Cognitive remediation/compensation    OT Goals(Current goals can be found in the care plan section) Acute Rehab OT Goals Patient Stated Goal: go home OT Goal Formulation: With patient/family Time For Goal Achievement: 12/23/20 Potential to Achieve Goals: Good ADL Goals Pt Will Perform Lower Body Dressing: with caregiver independent in assisting;with min assist;sit to/from stand Pt Will Transfer to Toilet: stand pivot transfer;bedside commode;with min assist (caregiver assist) Additional ADL Goal #1: Pt will perform seated sponge bath with modified independence with caregiver assist as needed.  OT Frequency: Min 2X/week   Barriers to D/C:            Co-evaluation PT/OT/SLP Co-Evaluation/Treatment:  Yes Reason for Co-Treatment: For patient/therapist safety;To address functional/ADL transfers PT goals addressed during session: Mobility/safety with mobility;Balance;Proper use of DME OT goals addressed during session: Proper use of Adaptive equipment and DME;ADL's and self-care      AM-PAC OT "6 Clicks" Daily  Activity     Outcome Measure Help from another person eating meals?: None Help from another person taking care of personal grooming?: None Help from another person toileting, which includes using toliet, bedpan, or urinal?: Total Help from another person bathing (including washing, rinsing, drying)?: A Lot Help from another person to put on and taking off regular upper body clothing?: A Lot Help from another person to put on and taking off regular lower body clothing?: A Lot 6 Click Score: 15   End of Session    Activity Tolerance: Patient tolerated treatment well Patient left: in chair;with call bell/phone within reach;with chair alarm set;with family/visitor present;with nursing/sitter in room;Other (comment) (pillow under LLE)  OT Visit Diagnosis: Other abnormalities of gait and mobility (R26.89);Muscle weakness (generalized) (M62.81);Pain Pain - Right/Left: Left Pain - part of body: Ankle and joints of foot;Leg                Time: 8184-0375 OT Time Calculation (min): 39 min Charges:  OT General Charges $OT Visit: 1 Visit OT Evaluation $OT Eval Moderate Complexity: 1 Mod OT Treatments $Self Care/Home Management : 8-22 mins  Hanley Hays, MPH, MS, OTR/L ascom [phone] 12/09/20, 11:08 AM

## 2020-12-09 NOTE — TOC Progression Note (Addendum)
Transition of Care [facility]) - Progression Note    Patient Details  Name: Valerie Bell MRN: [id number] Date of Birth: 1966/03/05  [facility]) CM/SW [place], LCSW Phone Number: 12/09/2020, 11:41 AM  Clinical Narrative:  PT and OT recommending SNF. Tried calling son. Voicemail not set up. Will try again later. Asked RN to let me know if he comes by today.  2:35 pm: Tried calling son again.  Expected Discharge Plan: [place] Barriers to Discharge: Continued Medical Work up  Expected Discharge Plan and Services Expected Discharge Plan: [place] Choice: Resumption of Svcs/PTA Provider, Durable Medical Equipment Living arrangements for the past 2 months: Single Family Home                 DME Arranged: 3-N-1 DME Agency: [employer] Date DME Agency Contacted: 12/07/20   Representative spoke with at DME Agency: Suanne Marker HH Arranged: RN, PT, Speech Therapy HH Agency: [place] Date [place]: 12/06/20   Representative spoke with at [place]: [place] Interventions    Readmission Risk Interventions Readmission Risk Prevention Plan 12/07/2020  Transportation Screening Complete  PCP or Specialist Appt within 5-7 Days Complete  Home Care Screening Complete  Medication Review (RN CM) Complete  Some recent data might be hidden

## 2020-12-09 NOTE — Evaluation (Signed)
Physical Therapy Evaluation Patient Details Name: Valerie Bell MRN: [id number] DOB: September 23, 1965 Today's Date: 12/09/2020   History of Present Illness  55 y.o. female with medical history significant for hypertension, systemic lupus erythematosus, CVA and DVT, who presented to the emergency room with acute onset of worsening left lower extremity erythema and swelling with associated warmth and pain.  The patient was recently admitted for acute DVT of  both lower extremities for which she was started on Xarelto. Imaging revealed no thrombus.  Patient does have evidence of vascular disease however lower extremity runoff is normal.  No significant lower extremity stenosis is noted. Plain film x-ray with significant soft tissue swelling with no concern for acute osteomyelitis.   Clinical Impression  Patient received in bed, caregiver, RN and OT in room on arrival. Patient is agreeable to PT session. She is minimally verbal, will shake or nod head in response to questions. Slow processing. Patient required min assist for supine to sit. Mod +2 assist for sit to stand and to pivot to recliner. Patient has difficulty weight bearing on L LE due to swelling and pain. She will continue to benefit from skilled PT while here to improve functional mobility and independence for return home.       Follow Up Recommendations SNF;Supervision for mobility/OOB    Equipment Recommendations  None recommended by PT    Recommendations for Other Services       Precautions / Restrictions Precautions Precautions: Fall Restrictions Weight Bearing Restrictions: No      Mobility  Bed Mobility Overal bed mobility: Needs Assistance Bed Mobility: Supine to Sit     Supine to sit: Min assist;HOB elevated     General bed mobility comments: min assist with B LEs to move off bed, increased time and effort    Transfers Overall transfer level: Needs assistance Equipment used: 2 person hand held assist Transfers:  Sit to/from Stand;Stand Pivot Transfers Sit to Stand: +2 physical assistance;Min assist Stand pivot transfers: +2 physical assistance;Mod assist       General transfer comment: decreased WBing on LLE 2/2 pain  Ambulation/Gait             General Gait Details: unable  Stairs            Wheelchair Mobility    Modified Rankin (Stroke Patients Only)       Balance Overall balance assessment: Needs assistance Sitting-balance support: Feet supported;Bilateral upper extremity supported Sitting balance-Leahy Scale: Good     Standing balance support: Bilateral upper extremity supported;During functional activity Standing balance-Leahy Scale: Poor Standing balance comment: required assist to remain standing                             Pertinent Vitals/Pain Pain Assessment: Faces Faces Pain Scale: Hurts little more Pain Location: LLE Pain Descriptors / Indicators: Discomfort Pain Intervention(s): Monitored during session;Repositioned    Home Living Family/patient expects to be discharged to:: Private residence Living Arrangements: Spouse/significant other Available Help at Discharge: Available 24 hours/day;Family Type of Home: House Home Access: Ramped entrance     Home Layout: One level Home Equipment: Environmental consultant - 4 wheels;Shower seat;Wheelchair - manual      Prior Function Level of Independence: Needs assistance   Gait / Transfers Assistance Needed: per Jennifer/pt, unable to walk for past 2 wks 2/2 LLE swelling/pain, before then was using rollator sometimes in the home, other times holding onto furniture/walls for support;Jennifer reports using w/c for  community  ADL's / Homemaking Assistance Needed: [name] reports up until 2 weeks ago was indep with basic ADL, seated shower, and pt was driving; [name] assists with groceries, meals, cleaning; pt's son assists with medications  Comments: Partner [name] reports no falls in past 29mo, but recent  admission pt endorses multiple recent falls.     Hand Dominance        Extremity/Trunk Assessment   Upper Extremity Assessment Upper Extremity Assessment: Generalized weakness    Lower Extremity Assessment Lower Extremity Assessment: Generalized weakness;LLE deficits/detail LLE Deficits / Details: very swollen and painful, limited ankle ROM LLE: Unable to fully assess due to pain    Cervical / Trunk Assessment Cervical / Trunk Assessment: Normal  Communication   Communication: Other (comment) (limited verbalizations, slow to respond, nods or shakes head yes/no)  Cognition Arousal/Alertness: Awake/alert Behavior During Therapy: Flat affect Overall Cognitive Status: Difficult to assess                                 General Comments: pt slow to process, limited verbal communication, cues and increased time to follow simple commands      General Comments General comments (skin integrity, edema, etc.): LLE swollen, red, worse distal to knee, RN aware    Exercises Other Exercises Other Exercises: With max encouragement, pt performed bed mobility and ADL transfers with OT/PT   Assessment/Plan    PT Assessment Patient needs continued PT services  PT Problem List Decreased strength;Decreased activity tolerance;Decreased balance;Decreased mobility;Pain;Decreased cognition       PT Treatment Interventions Gait training;DME instruction;Therapeutic activities;Therapeutic exercise;Functional mobility training;Balance training;Patient/family education    PT Goals (Current goals can be found in the Care Plan section)  Acute Rehab PT Goals Patient Stated Goal: Patient's caregiver/partner wants to take her home PT Goal Formulation: With patient Time For Goal Achievement: 12/16/20 Potential to Achieve Goals: Fair    Frequency Min 2X/week   Barriers to discharge        Co-evaluation PT/OT/SLP Co-Evaluation/Treatment: Yes Reason for Co-Treatment: Necessary to  address cognition/behavior during functional activity;For patient/therapist safety;To address functional/ADL transfers PT goals addressed during session: Mobility/safety with mobility;Balance OT goals addressed during session: Proper use of Adaptive equipment and DME;ADL's and self-care       AM-PAC PT "6 Clicks" Mobility  Outcome Measure Help needed turning from your back to your side while in a flat bed without using bedrails?: A Little Help needed moving from lying on your back to sitting on the side of a flat bed without using bedrails?: A Lot Help needed moving to and from a bed to a chair (including a wheelchair)?: A Lot Help needed standing up from a chair using your arms (e.g., wheelchair or bedside chair)?: A Lot Help needed to walk in hospital room?: Total Help needed climbing 3-5 steps with a railing? : Total 6 Click Score: 11    End of Session   Activity Tolerance: Patient limited by pain;Patient limited by fatigue Patient left: with call bell/phone within reach;in chair;with chair alarm set;with family/visitor present Nurse Communication: Mobility status PT Visit Diagnosis: Muscle weakness (generalized) (M62.81);Difficulty in walking, not elsewhere classified (R26.2);Other abnormalities of gait and mobility (R26.89);Pain;Unsteadiness on feet (R26.81) Pain - Right/Left: Left Pain - part of body: Leg;Ankle and joints of foot    Time: 1005-1027 PT Time Calculation (min) (ACUTE ONLY): 22 min   Charges:   PT Evaluation $PT Eval Moderate Complexity: 1 Mod  Amanda Cockayne, PT, GCS 12/09/20,11:24 AM

## 2020-12-09 NOTE — Progress Notes (Signed)
Patient also refused to get vital signs taken. Valerie Bell

## 2020-12-09 NOTE — Progress Notes (Signed)
PROGRESS NOTE    Valerie Bell  XAJ:[id number] DOB: 1965-09-08 DOA: 12/05/2020 PCP: [facility]   Brief Narrative: 55 y.o. female with medical history significant for hypertension, systemic lupus erythematosus, CVA and DVT, who presented to the emergency room with acute onset of worsening left lower extremity erythema and swelling with associated warmth and pain.  The patient was recently admitted for acute DVT of  both lower extremities for which she was started on Xarelto.  No chest pain or palpitations.  No cough or wheezing or hemoptysis.  No nausea or vomiting or abdominal pain. Imaging revealed no thrombus.  Patient does have evidence of vascular disease however lower extremity runoff is normal.  No significant lower extremity stenosis is noted. Plain film x-ray with significant soft tissue swelling with no concern for acute osteomyelitis.   Assessment & Plan:   Active Problems:   Left leg cellulitis  Left lower extremity nonpurulent cellulitis Likely underlying clot burden is contributing Significant swelling of left lower extremity Vascular ultrasound shows diffuse nonocclusive thrombosis seen on prior ultrasound, no acute thrombus noted X-ray negative for fracture or dislocation.  Low concern for osteomyelitis Continue IV Rocephin for now If patient's clinical condition deteriorates we will pursue MRI to rule out underlying osteomyelitis  ??  Acute metabolic encephalopathy Improving Patient more awake today, able to carry out a conversation UA neg (has been on AB) UC and BC X 2 pending Chest x-ray ordered, pt not cooperative blood culture ordered CT head unremarkable Monitor closely  BLE DVT Clot burden appears stable on imaging No concern for acute vascular obstruction Continue Xarelto  Essential hypertension PTA amlodipine and hydralazine  Hyperlipidemia PTA statin  GERD PPI   DVT prophylaxis: Xarelto Code Status: Full Family Communication:  Met caregiver at bedside Disposition Plan: Status is: Inpatient  Remains inpatient appropriate because:Inpatient level of care appropriate due to severity of illness  Dispo: The patient is from: Home              Anticipated d/c is to: SNF              Patient currently is not medically stable to d/c.   Difficult to place patient No    Level of care: Med-Surg  Consultants:  None  Procedures:  None  Antimicrobials:  Ceftriaxone   Subjective: Patient noted to be more awake today, able to engage in simple conversations.  Patient denies any new complaints except for pain in left lower extremity, denies any shortness of breath, chest pain, abdominal pain, nausea/vomiting, fever/chills.  Objective: Vitals:   12/09/20 0605 12/09/20 0608 12/09/20 0956 12/09/20 1542  BP: (!) 159/106 (!) 168/93 110/78 104/73  Pulse: 72 80 100 93  Resp: $Remo'16  16 18  'hAqbX$ Temp: 98 F (36.7 C)  98.1 F (36.7 C) 97.9 F (36.6 C)  TempSrc: Oral     SpO2: 100%  100% 100%  Weight:      Height:      st 2 days.   Intake/Output Summary (Last 24 hours) at 12/09/2020 1729 Last data filed at 12/09/2020 1035 Gross per 24 hour  Intake 120 ml  Output 700 ml  Net -580 ml   Filed Weights   12/05/20 1758  Weight: 59 kg    Examination: General: NAD Cardiovascular: S1, S2 present Respiratory: CTAB Abdomen: Soft, nontender, nondistended, bowel sounds present Musculoskeletal: LLE swollen, TTP Skin: LLE erythema, warmth, tender to touch Psychiatry: Normal mood    Data Reviewed: I have personally reviewed  following labs and imaging studies  CBC: Recent Labs  Lab 12/05/20 1844 12/06/20 0638 12/08/20 0518 12/09/20 0625  WBC 13.3* 12.1* 10.0 9.4  NEUTROABS 11.9*  --   --  6.5  HGB 11.7* 11.3* 11.5* 12.8  HCT 36.6 34.2* 34.5* 39.9  MCV 96.8 95.0 93.8 96.4  PLT 87* 60* 130* 503   Basic Metabolic Panel: Recent Labs  Lab 12/05/20 1844 12/06/20 0638 12/09/20 0625  NA 135 135 140  K 4.4 4.0 3.9   CL 102 103 104  CO2 $Re'26 23 24  'NQr$ GLUCOSE 118* 85 72  BUN $Re'15 14 11  'oxl$ CREATININE 0.86 0.78 0.90  CALCIUM 9.4 8.9 9.3   GFR: Estimated Creatinine Clearance: 61.7 mL/min (by C-G formula based on SCr of 0.9 mg/dL). Liver Function Tests: Recent Labs  Lab 12/05/20 1844  AST 27  ALT 22  ALKPHOS 59  BILITOT 0.7  PROT 9.0*  ALBUMIN 3.6   Recent Labs  Lab 12/05/20 1844  LIPASE 30   Recent Labs  Lab 12/05/20 2006  AMMONIA 13   Coagulation Profile: Recent Labs  Lab 12/05/20 1940 12/05/20 2006  INR 1.1 1.0   Cardiac Enzymes: No results for input(s): CKTOTAL, CKMB, CKMBINDEX, TROPONINI in the last 168 hours. BNP (last 3 results) No results for input(s): PROBNP in the last 8760 hours. HbA1C: No results for input(s): HGBA1C in the last 72 hours. CBG: No results for input(s): GLUCAP in the last 168 hours. Lipid Profile: No results for input(s): CHOL, HDL, LDLCALC, TRIG, CHOLHDL, LDLDIRECT in the last 72 hours. Thyroid Function Tests: No results for input(s): TSH, T4TOTAL, FREET4, T3FREE, THYROIDAB in the last 72 hours. Anemia Panel: No results for input(s): VITAMINB12, FOLATE, FERRITIN, TIBC, IRON, RETICCTPCT in the last 72 hours. Sepsis Labs: Recent Labs  Lab 12/09/20 0625  PROCALCITON <0.10    Recent Results (from the past 240 hour(s))  Resp Panel by RT-PCR (Flu A&B, Covid) Nasopharyngeal Swab     Status: None   Collection Time: 12/05/20 10:30 PM   Specimen: Nasopharyngeal Swab; Nasopharyngeal(NP) swabs in vial transport medium  Result Value Ref Range Status   SARS Coronavirus 2 by RT PCR NEGATIVE NEGATIVE Final    Comment: (NOTE) SARS-CoV-2 target nucleic acids are NOT DETECTED.  The SARS-CoV-2 RNA is generally detectable in upper respiratory specimens during the acute phase of infection. The lowest concentration of SARS-CoV-2 viral copies this assay can detect is 138 copies/mL. A negative result does not preclude SARS-Cov-2 infection and should not be used as  the sole basis for treatment or other patient management decisions. A negative result may occur with  improper specimen collection/handling, submission of specimen other than nasopharyngeal swab, presence of viral mutation(s) within the areas targeted by this assay, and inadequate number of viral copies(<138 copies/mL). A negative result must be combined with clinical observations, patient history, and epidemiological information. The expected result is Negative.  Fact Sheet for Patients:  EntrepreneurPulse.com.au  Fact Sheet for Healthcare Providers:  IncredibleEmployment.be  This test is no t yet approved or cleared by the [place] FDA and  has been authorized for detection and/or diagnosis of SARS-CoV-2 by FDA under an Emergency Use Authorization (EUA). This EUA will remain  in effect (meaning this test can be used) for the duration of the COVID-19 declaration under Section 564(b)(1) of the Act, 21 U.S.C.section 360bbb-3(b)(1), unless the authorization is terminated  or revoked sooner.       Influenza A by PCR NEGATIVE NEGATIVE Final   Influenza B by  PCR NEGATIVE NEGATIVE Final    Comment: (NOTE) The Xpert Xpress SARS-CoV-2/FLU/RSV plus assay is intended as an aid in the diagnosis of influenza from Nasopharyngeal swab specimens and should not be used as a sole basis for treatment. Nasal washings and aspirates are unacceptable for Xpert Xpress SARS-CoV-2/FLU/RSV testing.  Fact Sheet for Patients: EntrepreneurPulse.com.au  Fact Sheet for Healthcare Providers: IncredibleEmployment.be  This test is not yet approved or cleared by the [place] FDA and has been authorized for detection and/or diagnosis of SARS-CoV-2 by FDA under an Emergency Use Authorization (EUA). This EUA will remain in effect (meaning this test can be used) for the duration of the COVID-19 declaration under Section 564(b)(1) of  the Act, 21 U.S.C. section 360bbb-3(b)(1), unless the authorization is terminated or revoked.  Performed at [facility], [address] [place], [place] [address]          Radiology Studies: CT HEAD WO CONTRAST  Result Date: 12/08/2020 CLINICAL DATA:  55 year old female with altered mental status. EXAM: CT HEAD WITHOUT CONTRAST TECHNIQUE: Contiguous axial images were obtained from the base of the skull through the vertex without intravenous contrast. COMPARISON:  Head CT dated 11/17/2020. FINDINGS: Brain: Mild age-related atrophy and chronic microvascular ischemic changes. Small old infarct involving the medial right occipital lobe. There is no acute intracranial hemorrhage. No mass effect or midline shift. No extra-axial fluid collection. Vascular: No hyperdense vessel or unexpected calcification. Skull: Normal. Negative for fracture or focal lesion. Sinuses/Orbits: Mild mucoperiosteal thickening of paranasal sinuses. The mastoid air cells are clear. No air-fluid level. Other: None IMPRESSION: 1. No acute intracranial pathology. 2. Mild age-related atrophy and chronic microvascular ischemic changes. Small old right occipital infarct. Electronically Signed   By: Anner Crete M.D.   On: 12/08/2020 20:22        Scheduled Meds:  amLODipine  10 mg Oral Daily   atorvastatin  40 mg Oral Daily   baclofen  5 mg Per Tube TID   hydrALAZINE  10 mg Oral TID   ketorolac  15 mg Intravenous Q6H   levETIRAcetam  1,500 mg Oral BID   mirtazapine  7.5 mg Oral QHS   pantoprazole  20 mg Oral Daily   [START ON 12/10/2020] polyethylene glycol  17 g Oral Daily   rivaroxaban  10 mg Oral QPM   Continuous Infusions:  cefTRIAXone (ROCEPHIN)  IV 1 g (12/08/20 2216)     LOS: 4 days     Alma Friendly, MD [facility]  If 7PM-7AM, please contact night-coverage 12/09/2020, 5:29 PM

## 2020-12-10 LAB — CBC WITH DIFFERENTIAL/PLATELET
Abs Immature Granulocytes: 0.04 10*3/uL (ref 0.00–0.07)
Basophils Absolute: 0 10*3/uL (ref 0.0–0.1)
Basophils Relative: 1 %
Eosinophils Absolute: 0.3 10*3/uL (ref 0.0–0.5)
Eosinophils Relative: 5 %
HCT: 34.6 % — ABNORMAL LOW (ref 36.0–46.0)
Hemoglobin: 11.3 g/dL — ABNORMAL LOW (ref 12.0–15.0)
Immature Granulocytes: 1 %
Lymphocytes Relative: 31 %
Lymphs Abs: 1.9 10*3/uL (ref 0.7–4.0)
MCH: 30.7 pg (ref 26.0–34.0)
MCHC: 32.7 g/dL (ref 30.0–36.0)
MCV: 94 fL (ref 80.0–100.0)
Monocytes Absolute: 0.6 10*3/uL (ref 0.1–1.0)
Monocytes Relative: 10 %
Neutro Abs: 3.3 10*3/uL (ref 1.7–7.7)
Neutrophils Relative %: 52 %
Platelets: 114 10*3/uL — ABNORMAL LOW (ref 150–400)
RBC: 3.68 MIL/uL — ABNORMAL LOW (ref 3.87–5.11)
RDW: 13.1 % (ref 11.5–15.5)
WBC: 6.1 10*3/uL (ref 4.0–10.5)
nRBC: 0 % (ref 0.0–0.2)

## 2020-12-10 LAB — BASIC METABOLIC PANEL
Anion gap: 6 (ref 5–15)
BUN: 14 mg/dL (ref 6–20)
CO2: 24 mmol/L (ref 22–32)
Calcium: 9.2 mg/dL (ref 8.9–10.3)
Chloride: 109 mmol/L (ref 98–111)
Creatinine, Ser: 0.79 mg/dL (ref 0.44–1.00)
GFR, Estimated: >60 mL/min (ref >60)
Glucose, Bld: 94 mg/dL (ref 70–99)
Potassium: 4.1 mmol/L (ref 3.5–5.1)
Sodium: 139 mmol/L (ref 135–145)

## 2020-12-10 LAB — BLOOD CULTURE ID PANEL (REFLEXED) - BCID2

## 2020-12-10 LAB — PROCALCITONIN: Procalcitonin: <0.1 ng/mL

## 2020-12-10 NOTE — Plan of Care (Signed)
  Problem: Nutrition: Goal: Adequate nutrition will be maintained Outcome: Progressing   Problem: Coping: Goal: Level of anxiety will decrease Outcome: Progressing   Problem: Safety: Goal: Ability to remain free from injury will improve Outcome: Progressing   Problem: Skin Integrity: Goal: Risk for impaired skin integrity will decrease Outcome: Progressing   

## 2020-12-10 NOTE — Progress Notes (Signed)
PHARMACY - PHYSICIAN COMMUNICATION CRITICAL VALUE ALERT - BLOOD CULTURE IDENTIFICATION (BCID)  Valerie Bell is an 55 y.o. female who presented to [facility] on 12/05/2020 with a chief complaint of calf swelling.    Assessment:  Blood cultures from 6/23 with GPC in 1 of 4 bottles, BCID = MRSE.  Patient being treating for LLE cellulitis, has BL DVT.  Due to encephalopathy on 6/22, cultures done.    Name of physician (or Provider) Contacted: Dr Horris Latino  Current antibiotics: ceftriaxone  Changes to prescribed antibiotics recommended:  Provider to evaluate patient today to determine if blood cx represents likely contaminant and need for antibiotic adjustment.  Results for orders placed or performed during the hospital encounter of 12/05/20  Blood Culture ID Panel (Reflexed) (Collected: 12/09/2020  6:25 AM)  Result Value Ref Range   Enterococcus faecalis NOT DETECTED NOT DETECTED   Enterococcus Faecium NOT DETECTED NOT DETECTED   Listeria monocytogenes NOT DETECTED NOT DETECTED   Staphylococcus species DETECTED (A) NOT DETECTED   Staphylococcus aureus (BCID) NOT DETECTED NOT DETECTED   Staphylococcus epidermidis DETECTED (A) NOT DETECTED   Staphylococcus lugdunensis NOT DETECTED NOT DETECTED   Streptococcus species NOT DETECTED NOT DETECTED   Streptococcus agalactiae NOT DETECTED NOT DETECTED   Streptococcus pneumoniae NOT DETECTED NOT DETECTED   Streptococcus pyogenes NOT DETECTED NOT DETECTED   A.calcoaceticus-baumannii NOT DETECTED NOT DETECTED   Bacteroides fragilis NOT DETECTED NOT DETECTED   Enterobacterales NOT DETECTED NOT DETECTED   Enterobacter cloacae complex NOT DETECTED NOT DETECTED   Escherichia coli NOT DETECTED NOT DETECTED   Klebsiella aerogenes NOT DETECTED NOT DETECTED   Klebsiella oxytoca NOT DETECTED NOT DETECTED   Klebsiella pneumoniae NOT DETECTED NOT DETECTED   Proteus species NOT DETECTED NOT DETECTED   Salmonella species NOT DETECTED NOT DETECTED   Serratia  marcescens NOT DETECTED NOT DETECTED   Haemophilus influenzae NOT DETECTED NOT DETECTED   Neisseria meningitidis NOT DETECTED NOT DETECTED   Pseudomonas aeruginosa NOT DETECTED NOT DETECTED   Stenotrophomonas maltophilia NOT DETECTED NOT DETECTED   Candida albicans NOT DETECTED NOT DETECTED   Candida auris NOT DETECTED NOT DETECTED   Candida glabrata NOT DETECTED NOT DETECTED   Candida krusei NOT DETECTED NOT DETECTED   Candida parapsilosis NOT DETECTED NOT DETECTED   Candida tropicalis NOT DETECTED NOT DETECTED   Cryptococcus neoformans/gattii NOT DETECTED NOT DETECTED   Methicillin resistance mecA/C DETECTED (A) NOT DETECTED   Doreene Eland, PharmD, BCPS.   Work Cell: [phone] 12/10/2020 8:01 AM

## 2020-12-10 NOTE — TOC Progression Note (Addendum)
Transition of Care [facility]) - Progression Note    Patient Details  Name: Valerie Bell MRN: [id number] Date of Birth: July 06, 1965  [facility] CM/SW [place], LCSW Phone Number: 12/10/2020, 9:06 AM  Clinical Narrative:  No call back from son yet and he did not visit yesterday per RN. Today's RN will notify CSW if he arrives.   12:00 pm: Tried calling son again. Sent him a text message requesting call.  12:15 pm: Received call back from son. He prefers patient return home and resume home health services at discharge. Plan is still for her to go home with him. Sent secure chat to MD and RN to notify.  Expected Discharge Plan: [place] Barriers to Discharge: Continued Medical Work up  Expected Discharge Plan and Services Expected Discharge Plan: [place] Choice: Resumption of Svcs/PTA Provider, Durable Medical Equipment Living arrangements for the past 2 months: Single Family Home                 DME Arranged: 3-N-1 DME Agency: [employer] Date DME Agency Contacted: 12/07/20   Representative spoke with at DME Agency: Suanne Marker HH Arranged: RN, PT, Speech Therapy HH Agency: [facility] Date [place]: 12/06/20   Representative spoke with at [place]: [place] Interventions    Readmission Risk Interventions Readmission Risk Prevention Plan 12/07/2020  Transportation Screening Complete  PCP or Specialist Appt within 5-7 Days Complete  Home Care Screening Complete  Medication Review (RN CM) Complete  Some recent data might be hidden

## 2020-12-10 NOTE — Progress Notes (Signed)
PROGRESS NOTE    Valerie Bell  OIZ:[id number] DOB: 09-28-1965 DOA: 12/05/2020 PCP: [facility]   Brief Narrative: 55 y.o. female with medical history significant for hypertension, systemic lupus erythematosus, CVA and DVT, who presented to the emergency room with acute onset of worsening left lower extremity erythema and swelling with associated warmth and pain.  The patient was recently admitted for acute DVT of  both lower extremities for which she was started on Xarelto.  No chest pain or palpitations.  No cough or wheezing or hemoptysis.  No nausea or vomiting or abdominal pain. Imaging revealed no thrombus.  Patient does have evidence of vascular disease however lower extremity runoff is normal.  No significant lower extremity stenosis is noted. Plain film x-ray with significant soft tissue swelling with no concern for acute osteomyelitis.   Assessment & Plan:   Active Problems:   Left leg cellulitis  Left lower extremity nonpurulent cellulitis Likely underlying clot burden is contributing Significant swelling of left lower extremity Vascular ultrasound shows diffuse nonocclusive thrombosis seen on prior ultrasound, no acute thrombus noted X-ray negative for fracture or dislocation.  Low concern for osteomyelitis Continue IV Rocephin for now If patient's clinical condition deteriorates we will pursue MRI to rule out underlying osteomyelitis  Acute metabolic encephalopathy Likely 2/2 Enterococcus faecium UTI UA neg, but UC growing > 100, 000 E. faecium BC X 2, with 1/4 growing methicillin resistant staph epi, likely contaminant, will repeat blood culture Procalcitonin negative Chest x-ray ordered, pt not cooperative CT head unremarkable Continue IV Rocephin for now Monitor closely  BLE DVT Clot burden appears stable on imaging No concern for acute vascular obstruction Continue Xarelto  Essential hypertension PTA amlodipine and  hydralazine  Hyperlipidemia PTA statin  GERD PPI   DVT prophylaxis: Xarelto Code Status: Full Family Communication: None at bedside Disposition Plan: Status is: Inpatient  Remains inpatient appropriate because:Inpatient level of care appropriate due to severity of illness  Dispo: The patient is from: Home              Anticipated d/c is to: SNF, son wants to take her home with [facility]              Patient currently is not medically stable to d/c.   Difficult to place patient No    Level of care: Med-Surg  Consultants:  None  Procedures:  None  Antimicrobials:  Ceftriaxone   Subjective: Today, patient denies any new complaints, lower extremity still tender and swollen, improved erythema.  Objective: Vitals:   12/09/20 2335 12/10/20 0625 12/10/20 0831 12/10/20 1556  BP: (!) 121/91 135/72 140/88 140/85  Pulse: 77 74 60 71  Resp: 18 16 16 16   Temp: 98.6 F (37 C) 98 F (36.7 C) 98.3 F (36.8 C) 97.9 F (36.6 C)  TempSrc:  Oral Oral   SpO2: 100% 99% 100% 100%  Weight:      Height:      st 2 days.   Intake/Output Summary (Last 24 hours) at 12/10/2020 1733 Last data filed at 12/10/2020 0700 Gross per 24 hour  Intake 239.82 ml  Output 100 ml  Net 139.82 ml   Filed Weights   12/05/20 1758  Weight: 59 kg    Examination: General: NAD, chronically ill-appearing Cardiovascular: S1, S2 present Respiratory: CTAB Abdomen: Soft, nontender, nondistended, bowel sounds present Musculoskeletal: LLE swollen, TTP Skin: LLE tender to touch Psychiatry: Normal mood    Data Reviewed: I have personally reviewed following labs and imaging  studies  CBC: Recent Labs  Lab 12/05/20 1844 12/06/20 2836 12/08/20 0518 12/09/20 0625 12/10/20 0706  WBC 13.3* 12.1* 10.0 9.4 6.1  NEUTROABS 11.9*  --   --  6.5 3.3  HGB 11.7* 11.3* 11.5* 12.8 11.3*  HCT 36.6 34.2* 34.5* 39.9 34.6*  MCV 96.8 95.0 93.8 96.4 94.0  PLT 87* 60* 130* 157 629*   Basic Metabolic Panel: Recent  Labs  Lab 12/05/20 1844 12/06/20 0638 12/09/20 0625 12/10/20 0706  NA 135 135 140 139  K 4.4 4.0 3.9 4.1  CL 102 103 104 109  CO2 26 23 24 24   GLUCOSE 118* 85 72 94  BUN 15 14 11 14   CREATININE 0.86 0.78 0.90 0.79  CALCIUM 9.4 8.9 9.3 9.2   GFR: Estimated Creatinine Clearance: 69.4 mL/min (by C-G formula based on SCr of 0.79 mg/dL). Liver Function Tests: Recent Labs  Lab 12/05/20 1844  AST 27  ALT 22  ALKPHOS 59  BILITOT 0.7  PROT 9.0*  ALBUMIN 3.6   Recent Labs  Lab 12/05/20 1844  LIPASE 30   Recent Labs  Lab 12/05/20 2006  AMMONIA 13   Coagulation Profile: Recent Labs  Lab 12/05/20 1940 12/05/20 2006  INR 1.1 1.0   Cardiac Enzymes: No results for input(s): CKTOTAL, CKMB, CKMBINDEX, TROPONINI in the last 168 hours. BNP (last 3 results) No results for input(s): PROBNP in the last 8760 hours. HbA1C: No results for input(s): HGBA1C in the last 72 hours. CBG: No results for input(s): GLUCAP in the last 168 hours. Lipid Profile: No results for input(s): CHOL, HDL, LDLCALC, TRIG, CHOLHDL, LDLDIRECT in the last 72 hours. Thyroid Function Tests: No results for input(s): TSH, T4TOTAL, FREET4, T3FREE, THYROIDAB in the last 72 hours. Anemia Panel: No results for input(s): VITAMINB12, FOLATE, FERRITIN, TIBC, IRON, RETICCTPCT in the last 72 hours. Sepsis Labs: Recent Labs  Lab 12/09/20 0625 12/10/20 0706  PROCALCITON <0.10 <0.10    Recent Results (from the past 240 hour(s))  Resp Panel by RT-PCR (Flu A&B, Covid) Nasopharyngeal Swab     Status: None   Collection Time: 12/05/20 10:30 PM   Specimen: Nasopharyngeal Swab; Nasopharyngeal(NP) swabs in vial transport medium  Result Value Ref Range Status   SARS Coronavirus 2 by RT PCR NEGATIVE NEGATIVE Final    Comment: (NOTE) SARS-CoV-2 target nucleic acids are NOT DETECTED.  The SARS-CoV-2 RNA is generally detectable in upper respiratory specimens during the acute phase of infection. The  lowest concentration of SARS-CoV-2 viral copies this assay can detect is 138 copies/mL. A negative result does not preclude SARS-Cov-2 infection and should not be used as the sole basis for treatment or other patient management decisions. A negative result may occur with  improper specimen collection/handling, submission of specimen other than nasopharyngeal swab, presence of viral mutation(s) within the areas targeted by this assay, and inadequate number of viral copies(<138 copies/mL). A negative result must be combined with clinical observations, patient history, and epidemiological information. The expected result is Negative.  Fact Sheet for Patients:  EntrepreneurPulse.com.au  Fact Sheet for Healthcare Providers:  IncredibleEmployment.be  This test is no t yet approved or cleared by the [place] FDA and  has been authorized for detection and/or diagnosis of SARS-CoV-2 by FDA under an Emergency Use Authorization (EUA). This EUA will remain  in effect (meaning this test can be used) for the duration of the COVID-19 declaration under Section 564(b)(1) of the Act, 21 U.S.C.section 360bbb-3(b)(1), unless the authorization is terminated  or revoked sooner.  Influenza A by PCR NEGATIVE NEGATIVE Final   Influenza B by PCR NEGATIVE NEGATIVE Final    Comment: (NOTE) The Xpert Xpress SARS-CoV-2/FLU/RSV plus assay is intended as an aid in the diagnosis of influenza from Nasopharyngeal swab specimens and should not be used as a sole basis for treatment. Nasal washings and aspirates are unacceptable for Xpert Xpress SARS-CoV-2/FLU/RSV testing.  Fact Sheet for Patients: EntrepreneurPulse.com.au  Fact Sheet for Healthcare Providers: IncredibleEmployment.be  This test is not yet approved or cleared by the [place] FDA and has been authorized for detection and/or diagnosis of SARS-CoV-2 by FDA under  an Emergency Use Authorization (EUA). This EUA will remain in effect (meaning this test can be used) for the duration of the COVID-19 declaration under Section 564(b)(1) of the Act, 21 U.S.C. section 360bbb-3(b)(1), unless the authorization is terminated or revoked.  Performed at [facility], [address] [place], [place] [address]   CULTURE, BLOOD (ROUTINE X 2) w Reflex to ID Panel     Status: None (Preliminary result)   Collection Time: 12/09/20  6:24 AM   Specimen: BLOOD  Result Value Ref Range Status   Specimen Description BLOOD LEFT ASSIST CONTROL  Final   Special Requests   Final    BOTTLES DRAWN AEROBIC AND ANAEROBIC Blood Culture adequate volume   Culture   Final    NO GROWTH < 24 HOURS Performed at [facility], [address], [place], [place] [address]    Report Status PENDING  Incomplete  CULTURE, BLOOD (ROUTINE X 2) w Reflex to ID Panel     Status: None (Preliminary result)   Collection Time: 12/09/20  6:25 AM   Specimen: BLOOD  Result Value Ref Range Status   Specimen Description   Final    BLOOD RIGHT ASSIST CONTROL Performed at [facility], [address] [place], [place] [address]    Special Requests   Final    BOTTLES DRAWN AEROBIC AND ANAEROBIC Blood Culture adequate volume Performed at [facility], [address] [place], [place] [address]    Culture  Setup Time   Final    AEROBIC BOTTLE ONLY GRAM POSITIVE COCCI IN CLUSTERS CRITICAL RESULT CALLED TO, READ BACK BY AND VERIFIED WITH: DEVIN MITCHELL @0751  ON 06.24.22.SH Performed at [facility], [place] [address] [place], [place] [address]    Culture GRAM POSITIVE COCCI IN CLUSTERS  Final   Report Status PENDING  Incomplete  Blood Culture ID Panel (Reflexed)     Status: Abnormal   Collection Time: 12/09/20  6:25 AM  Result Value Ref Range Status   Enterococcus faecalis NOT DETECTED NOT DETECTED Final   Enterococcus Faecium NOT DETECTED NOT DETECTED  Final   Listeria monocytogenes NOT DETECTED NOT DETECTED Final   Staphylococcus species DETECTED (A) NOT DETECTED Final    Comment: CRITICAL RESULT CALLED TO, READ BACK BY AND VERIFIED WITH: DEVIN MITCHELL @0751  ON 06.24.22.SH    Staphylococcus aureus (BCID) NOT DETECTED NOT DETECTED Final   Staphylococcus epidermidis DETECTED (A) NOT DETECTED Final    Comment: Methicillin (oxacillin) resistant coagulase negative staphylococcus. Possible blood culture contaminant (unless isolated from more than one blood culture draw or clinical case suggests pathogenicity). No antibiotic treatment is indicated for blood  culture contaminants. CRITICAL RESULT CALLED TO, READ BACK BY AND VERIFIED WITH: DEVIN MITCHELL @0751  ON 06.24.22.SH    Staphylococcus lugdunensis NOT DETECTED NOT DETECTED Final   Streptococcus species NOT DETECTED NOT DETECTED Final   Streptococcus agalactiae NOT  DETECTED NOT DETECTED Final   Streptococcus pneumoniae NOT DETECTED NOT DETECTED Final   Streptococcus pyogenes NOT DETECTED NOT DETECTED Final   A.calcoaceticus-baumannii NOT DETECTED NOT DETECTED Final   Bacteroides fragilis NOT DETECTED NOT DETECTED Final   Enterobacterales NOT DETECTED NOT DETECTED Final   Enterobacter cloacae complex NOT DETECTED NOT DETECTED Final   Escherichia coli NOT DETECTED NOT DETECTED Final   Klebsiella aerogenes NOT DETECTED NOT DETECTED Final   Klebsiella oxytoca NOT DETECTED NOT DETECTED Final   Klebsiella pneumoniae NOT DETECTED NOT DETECTED Final   Proteus species NOT DETECTED NOT DETECTED Final   Salmonella species NOT DETECTED NOT DETECTED Final   Serratia marcescens NOT DETECTED NOT DETECTED Final   Haemophilus influenzae NOT DETECTED NOT DETECTED Final   Neisseria meningitidis NOT DETECTED NOT DETECTED Final   Pseudomonas aeruginosa NOT DETECTED NOT DETECTED Final   Stenotrophomonas maltophilia NOT DETECTED NOT DETECTED Final   Candida albicans NOT DETECTED NOT DETECTED Final    Candida auris NOT DETECTED NOT DETECTED Final   Candida glabrata NOT DETECTED NOT DETECTED Final   Candida krusei NOT DETECTED NOT DETECTED Final   Candida parapsilosis NOT DETECTED NOT DETECTED Final   Candida tropicalis NOT DETECTED NOT DETECTED Final   Cryptococcus neoformans/gattii NOT DETECTED NOT DETECTED Final   Methicillin resistance mecA/C DETECTED (A) NOT DETECTED Final    Comment: CRITICAL RESULT CALLED TO, READ BACK BY AND VERIFIED WITH: DEVIN MITCHELL @0751  ON 06.24.22.SH Performed at [facility], [address], [place], [place] [address]   Urine Culture     Status: Abnormal (Preliminary result)   Collection Time: 12/09/20 10:17 AM   Specimen: Urine, Random  Result Value Ref Range Status   Specimen Description   Final    URINE, RANDOM Performed at [facility], [address] [place], [place] [address]    Special Requests   Final    NONE Performed at [facility], [address] [place], [place] [address]    Culture >=100,000 COLONIES/mL ENTEROCOCCUS FAECIUM (A)  Final   Report Status PENDING  Incomplete         Radiology Studies: CT HEAD WO CONTRAST  Result Date: 12/08/2020 CLINICAL DATA:  55 year old female with altered mental status. EXAM: CT HEAD WITHOUT CONTRAST TECHNIQUE: Contiguous axial images were obtained from the base of the skull through the vertex without intravenous contrast. COMPARISON:  Head CT dated 11/17/2020. FINDINGS: Brain: Mild age-related atrophy and chronic microvascular ischemic changes. Small old infarct involving the medial right occipital lobe. There is no acute intracranial hemorrhage. No mass effect or midline shift. No extra-axial fluid collection. Vascular: No hyperdense vessel or unexpected calcification. Skull: Normal. Negative for fracture or focal lesion. Sinuses/Orbits: Mild mucoperiosteal thickening of paranasal sinuses. The mastoid air cells are clear. No air-fluid level. Other: None IMPRESSION: 1.  No acute intracranial pathology. 2. Mild age-related atrophy and chronic microvascular ischemic changes. Small old right occipital infarct. Electronically Signed   By: Anner Crete M.D.   On: 12/08/2020 20:22        Scheduled Meds:  amLODipine  10 mg Oral Daily   atorvastatin  40 mg Oral Daily   baclofen  5 mg Per Tube TID   hydrALAZINE  10 mg Oral TID   levETIRAcetam  1,500 mg Oral BID   mirtazapine  7.5 mg Oral QHS   pantoprazole  20 mg Oral Daily   polyethylene glycol  17 g Oral Daily   rivaroxaban  10 mg Oral QPM  Continuous Infusions:  sodium chloride 10 mL/hr at 12/10/20 0356   cefTRIAXone (ROCEPHIN)  IV 200 mL/hr at 12/10/20 0356     LOS: 5 days     Alma Friendly, MD [facility]  If 7PM-7AM, please contact night-coverage 12/10/2020, 5:33 PM

## 2020-12-10 NOTE — Plan of Care (Signed)
Continuing with plan of care. 

## 2020-12-10 NOTE — Progress Notes (Signed)
Physical Therapy Treatment Patient Details Name: Valerie Bell MRN: [id number] DOB: Dec 02, 1965 Today's Date: 12/10/2020    History of Present Illness 55 y.o. female with medical history significant for hypertension, systemic lupus erythematosus, CVA and DVT, who presented to the emergency room with acute onset of worsening left lower extremity erythema and swelling with associated warmth and pain.  The patient was recently admitted for acute DVT of  both lower extremities for which she was started on Xarelto. Imaging revealed no thrombus.  Patient does have evidence of vascular disease however lower extremity runoff is normal.  No significant lower extremity stenosis is noted. Plain film x-ray with significant soft tissue swelling with no concern for acute osteomyelitis.    PT Comments    The pt continues to demonstrates inability to WB through the LLE for pre-gait and gait training. She does demonstrate improved independence with sit<>stand transfers with the use of a RW. The pt also demonstrates improved tolerance to LE being in dependent position. At this time the pt continue to be most appropriate for STR in order to progress functional mobility. PT will continue to follow while in house.     Follow Up Recommendations  SNF;Supervision for mobility/OOB     Equipment Recommendations  None recommended by PT    Recommendations for Other Services       Precautions / Restrictions Precautions Precautions: Fall Precaution Comments: LLE cellulits; difficulty with WB Restrictions Weight Bearing Restrictions: No    Mobility  Bed Mobility Overal bed mobility: Needs Assistance Bed Mobility: Supine to Sit     Supine to sit: HOB elevated;Min assist     General bed mobility comments: Assist for movement of RLE, increased time.    Transfers Overall transfer level: Needs assistance Equipment used: Rolling walker (2 wheeled) Transfers: Sit to/from Stand (x3) Sit to Stand: Min  assist;From elevated surface         General transfer comment: Pt requires verbal cues for increased forward translation for sit<>stand transfer. Also requires cues for decreased hip flexion for more erect posture in standing.  Ambulation/Gait             General Gait Details: Pt reports inability to WB through LLE. She is able to achieve foot flat contact, but is unable to progress towards lateral weightshifting d/t c/o LLE pain.   Stairs             Wheelchair Mobility    Modified Rankin (Stroke Patients Only)       Balance Overall balance assessment: Needs assistance Sitting-balance support: Feet supported;Bilateral upper extremity supported Sitting balance-Leahy Scale: Fair     Standing balance support: Bilateral upper extremity supported;During functional activity Standing balance-Leahy Scale: Fair                              Cognition Arousal/Alertness: Awake/alert Behavior During Therapy: WFL for tasks assessed/performed Overall Cognitive Status: Within Functional Limits for tasks assessed                                 General Comments: pt slow to process, limited verbal communication, cues and increased time to follow simple commands however answers questions appropriately.      Exercises General Exercises - Lower Extremity Long Arc Quad: 5 reps;Both;Seated;AROM Hip Flexion/Marching: Seated;10 reps;Both;AAROM    General Comments        Pertinent Vitals/Pain Pain  Assessment: No/denies pain Pain Location: LLE Pain Descriptors / Indicators: Grimacing Pain Intervention(s): Monitored during session;Repositioned    Home Living                      Prior Function            PT Goals (current goals can now be found in the care plan section) Acute Rehab PT Goals Patient Stated Goal: Patient's caregiver/partner wants to take her home PT Goal Formulation: With patient Time For Goal Achievement:  12/16/20 Potential to Achieve Goals: Fair Progress towards PT goals: Progressing toward goals    Frequency    Min 2X/week      PT Plan Current plan remains appropriate    Co-evaluation              AM-PAC PT "6 Clicks" Mobility   Outcome Measure  Help needed turning from your back to your side while in a flat bed without using bedrails?: A Little Help needed moving from lying on your back to sitting on the side of a flat bed without using bedrails?: A Little Help needed moving to and from a bed to a chair (including a wheelchair)?: A Lot Help needed standing up from a chair using your arms (e.g., wheelchair or bedside chair)?: A Little Help needed to walk in hospital room?: Total Help needed climbing 3-5 steps with a railing? : Total 6 Click Score: 13    End of Session Equipment Utilized During Treatment: Gait belt Activity Tolerance: Patient limited by pain Patient left: with call bell/phone within reach;in bed;with bed alarm set   PT Visit Diagnosis: Muscle weakness (generalized) (M62.81);Difficulty in walking, not elsewhere classified (R26.2);Other abnormalities of gait and mobility (R26.89);Pain;Unsteadiness on feet (R26.81) Pain - Right/Left: Left Pain - part of body: Leg;Ankle and joints of foot     Time: 1101-1134 PT Time Calculation (min) (ACUTE ONLY): 33 min  Charges:  $Therapeutic Activity: 23-37 mins                     12:01 PM, 12/10/20 Valerie Bell A. Saverio Danker PT, DPT Physical Therapist - [facility]    Shan Valdes A Loraine Freid 12/10/2020, 11:59 AM

## 2020-12-11 LAB — URINE CULTURE: Culture: >=100000 — AB

## 2020-12-11 LAB — CBC WITH DIFFERENTIAL/PLATELET
Abs Immature Granulocytes: 0.07 10*3/uL (ref 0.00–0.07)
Basophils Absolute: 0 10*3/uL (ref 0.0–0.1)
Basophils Relative: 1 %
Eosinophils Absolute: 0.3 10*3/uL (ref 0.0–0.5)
Eosinophils Relative: 5 %
HCT: 34.4 % — ABNORMAL LOW (ref 36.0–46.0)
Hemoglobin: 11 g/dL — ABNORMAL LOW (ref 12.0–15.0)
Immature Granulocytes: 1 %
Lymphocytes Relative: 31 %
Lymphs Abs: 2 10*3/uL (ref 0.7–4.0)
MCH: 30.8 pg (ref 26.0–34.0)
MCHC: 32 g/dL (ref 30.0–36.0)
MCV: 96.4 fL (ref 80.0–100.0)
Monocytes Absolute: 0.7 10*3/uL (ref 0.1–1.0)
Monocytes Relative: 11 %
Neutro Abs: 3.4 10*3/uL (ref 1.7–7.7)
Neutrophils Relative %: 51 %
Platelets: 119 10*3/uL — ABNORMAL LOW (ref 150–400)
RBC: 3.57 MIL/uL — ABNORMAL LOW (ref 3.87–5.11)
RDW: 13.1 % (ref 11.5–15.5)
WBC: 6.5 10*3/uL (ref 4.0–10.5)
nRBC: 0 % (ref 0.0–0.2)

## 2020-12-11 LAB — BASIC METABOLIC PANEL
Anion gap: 5 (ref 5–15)
BUN: 12 mg/dL (ref 6–20)
CO2: 26 mmol/L (ref 22–32)
Calcium: 8.9 mg/dL (ref 8.9–10.3)
Chloride: 107 mmol/L (ref 98–111)
Creatinine, Ser: 0.96 mg/dL (ref 0.44–1.00)
GFR, Estimated: >60 mL/min (ref >60)
Glucose, Bld: 89 mg/dL (ref 70–99)
Potassium: 4.1 mmol/L (ref 3.5–5.1)
Sodium: 138 mmol/L (ref 135–145)

## 2020-12-11 MED ORDER — CEPHALEXIN 500 MG PO CAPS
1000.0000 mg | ORAL_CAPSULE | Freq: Three times a day (TID) | ORAL | Status: DC
  Administered 2020-12-12: 1000 mg via ORAL
  Filled 2020-12-11: qty 2

## 2020-12-11 NOTE — Plan of Care (Signed)
Continuing with plan of care. 

## 2020-12-11 NOTE — Progress Notes (Signed)
PROGRESS NOTE    Valerie Bell  NHA:[id number] DOB: [birth year] DOA: 12/05/2020 PCP: [facility]   Brief Narrative: 55 y.o. female with medical history significant for hypertension, systemic lupus erythematosus, CVA and DVT, who presented to the emergency room with acute onset of worsening left lower extremity erythema and swelling with associated warmth and pain.  The patient was recently admitted for acute DVT of  both lower extremities for which she was started on Xarelto.  No chest pain or palpitations.  No cough or wheezing or hemoptysis.  No nausea or vomiting or abdominal pain. Imaging revealed no thrombus.  Patient does have evidence of vascular disease however lower extremity runoff is normal.  No significant lower extremity stenosis is noted. Plain film x-ray with significant soft tissue swelling with no concern for acute osteomyelitis.   Assessment & Plan:   Active Problems:   Left leg cellulitis  Left lower extremity nonpurulent cellulitis Likely underlying clot burden is contributing Significant swelling of left lower extremity on presentation Vascular ultrasound shows diffuse nonocclusive thrombosis seen on prior ultrasound, no acute thrombus noted X-ray negative for fracture or dislocation.  Low concern for osteomyelitis S/P IV Rocephin--> switch to Keflex If patient's clinical condition deteriorates we will pursue MRI to rule out underlying osteomyelitis  Acute metabolic encephalopathy Improving UA neg, but UC growing > 100, 000 VRE (discussed extensively with ID Dr. Gale Journey on 12/11/20, no further treatment required, could be seen after broad-spectrum antibiotics given BC X 2, with 1/4 growing methicillin resistant staph epi, likely contaminant, repeat blood culture, NGTD Procalcitonin negative Chest x-ray ordered, pt not cooperative CT head unremarkable Monitor closely  BLE DVT Clot burden appears stable on imaging No concern for acute vascular  obstruction Continue Xarelto  Essential hypertension PTA amlodipine and hydralazine  Hyperlipidemia PTA statin  GERD PPI   DVT prophylaxis: Xarelto Code Status: Full Family Communication: None at bedside Disposition Plan: Status is: Inpatient  Remains inpatient appropriate because:Inpatient level of care appropriate due to severity of illness  Dispo: The patient is from: Home              Anticipated d/c is to: SNF, son wants to take her home with [facility]              Patient currently is not medically stable to d/c.   Difficult to place patient No    Level of care: Med-Surg  Consultants:  None  Procedures:  None  Antimicrobials:  Keflex   Subjective: Today, patient denies any new complaints. LLE seems to be improving slowly, less erythema and swelling.  Still tender to touch  Objective: Vitals:   12/10/20 1556 12/10/20 2109 12/11/20 0626 12/11/20 1659  BP: 140/85 (!) 148/91 (!) 159/80 123/83  Pulse: 71 70 73 78  Resp: 16 16 16 18   Temp: 97.9 F (36.6 C) 98.9 F (37.2 C) (!) 97.4 F (36.3 C) 98.1 F (36.7 C)  TempSrc:  Oral Oral Oral  SpO2: 100% 100% 99% 100%  Weight:      Height:      st 2 days.   Intake/Output Summary (Last 24 hours) at 12/11/2020 1723 Last data filed at 12/11/2020 8329 Gross per 24 hour  Intake 0 ml  Output 950 ml  Net -950 ml   Filed Weights   12/05/20 1758  Weight: 59 kg    Examination: General: NAD, chronically ill-appearing Cardiovascular: S1, S2 present Respiratory: CTAB Abdomen: Soft, nontender, nondistended, bowel sounds present Musculoskeletal: LLE swollen, TTP  Skin: LLE tender to touch Psychiatry: Flat affect, poor mood    Data Reviewed: I have personally reviewed following labs and imaging studies  CBC: Recent Labs  Lab 12/05/20 1844 12/06/20 1700 12/08/20 0518 12/09/20 0625 12/10/20 0706 12/11/20 0448  WBC 13.3* 12.1* 10.0 9.4 6.1 6.5  NEUTROABS 11.9*  --   --  6.5 3.3 3.4  HGB 11.7* 11.3* 11.5*  12.8 11.3* 11.0*  HCT 36.6 34.2* 34.5* 39.9 34.6* 34.4*  MCV 96.8 95.0 93.8 96.4 94.0 96.4  PLT 87* 60* 130* 157 114* 174*   Basic Metabolic Panel: Recent Labs  Lab 12/05/20 1844 12/06/20 0638 12/09/20 0625 12/10/20 0706 12/11/20 0448  NA 135 135 140 139 138  K 4.4 4.0 3.9 4.1 4.1  CL 102 103 104 109 107  CO2 26 23 24 24 26   GLUCOSE 118* 85 72 94 89  BUN 15 14 11 14 12   CREATININE 0.86 0.78 0.90 0.79 0.96  CALCIUM 9.4 8.9 9.3 9.2 8.9   GFR: Estimated Creatinine Clearance: 57.8 mL/min (by C-G formula based on SCr of 0.96 mg/dL). Liver Function Tests: Recent Labs  Lab 12/05/20 1844  AST 27  ALT 22  ALKPHOS 59  BILITOT 0.7  PROT 9.0*  ALBUMIN 3.6   Recent Labs  Lab 12/05/20 1844  LIPASE 30   Recent Labs  Lab 12/05/20 2006  AMMONIA 13   Coagulation Profile: Recent Labs  Lab 12/05/20 1940 12/05/20 2006  INR 1.1 1.0   Cardiac Enzymes: No results for input(s): CKTOTAL, CKMB, CKMBINDEX, TROPONINI in the last 168 hours. BNP (last 3 results) No results for input(s): PROBNP in the last 8760 hours. HbA1C: No results for input(s): HGBA1C in the last 72 hours. CBG: No results for input(s): GLUCAP in the last 168 hours. Lipid Profile: No results for input(s): CHOL, HDL, LDLCALC, TRIG, CHOLHDL, LDLDIRECT in the last 72 hours. Thyroid Function Tests: No results for input(s): TSH, T4TOTAL, FREET4, T3FREE, THYROIDAB in the last 72 hours. Anemia Panel: No results for input(s): VITAMINB12, FOLATE, FERRITIN, TIBC, IRON, RETICCTPCT in the last 72 hours. Sepsis Labs: Recent Labs  Lab 12/09/20 0625 12/10/20 0706  PROCALCITON <0.10 <0.10    Recent Results (from the past 240 hour(s))  Resp Panel by RT-PCR (Flu A&B, Covid) Nasopharyngeal Swab     Status: None   Collection Time: 12/05/20 10:30 PM   Specimen: Nasopharyngeal Swab; Nasopharyngeal(NP) swabs in vial transport medium  Result Value Ref Range Status   SARS Coronavirus 2 by RT PCR NEGATIVE NEGATIVE Final     Comment: (NOTE) SARS-CoV-2 target nucleic acids are NOT DETECTED.  The SARS-CoV-2 RNA is generally detectable in upper respiratory specimens during the acute phase of infection. The lowest concentration of SARS-CoV-2 viral copies this assay can detect is 138 copies/mL. A negative result does not preclude SARS-Cov-2 infection and should not be used as the sole basis for treatment or other patient management decisions. A negative result may occur with  improper specimen collection/handling, submission of specimen other than nasopharyngeal swab, presence of viral mutation(s) within the areas targeted by this assay, and inadequate number of viral copies(<138 copies/mL). A negative result must be combined with clinical observations, patient history, and epidemiological information. The expected result is Negative.  Fact Sheet for Patients:  EntrepreneurPulse.com.au  Fact Sheet for Healthcare Providers:  IncredibleEmployment.be  This test is no t yet approved or cleared by the [place] FDA and  has been authorized for detection and/or diagnosis of SARS-CoV-2 by FDA under an Emergency Use  Authorization (EUA). This EUA will remain  in effect (meaning this test can be used) for the duration of the COVID-19 declaration under Section 564(b)(1) of the Act, 21 U.S.C.section 360bbb-3(b)(1), unless the authorization is terminated  or revoked sooner.       Influenza A by PCR NEGATIVE NEGATIVE Final   Influenza B by PCR NEGATIVE NEGATIVE Final    Comment: (NOTE) The Xpert Xpress SARS-CoV-2/FLU/RSV plus assay is intended as an aid in the diagnosis of influenza from Nasopharyngeal swab specimens and should not be used as a sole basis for treatment. Nasal washings and aspirates are unacceptable for Xpert Xpress SARS-CoV-2/FLU/RSV testing.  Fact Sheet for Patients: EntrepreneurPulse.com.au  Fact Sheet for Healthcare  Providers: IncredibleEmployment.be  This test is not yet approved or cleared by the [place] FDA and has been authorized for detection and/or diagnosis of SARS-CoV-2 by FDA under an Emergency Use Authorization (EUA). This EUA will remain in effect (meaning this test can be used) for the duration of the COVID-19 declaration under Section 564(b)(1) of the Act, 21 U.S.C. section 360bbb-3(b)(1), unless the authorization is terminated or revoked.  Performed at [facility], [address] [place], [place] [address]   CULTURE, BLOOD (ROUTINE X 2) w Reflex to ID Panel     Status: None (Preliminary result)   Collection Time: 12/09/20  6:24 AM   Specimen: BLOOD  Result Value Ref Range Status   Specimen Description BLOOD LEFT ASSIST CONTROL  Final   Special Requests   Final    BOTTLES DRAWN AEROBIC AND ANAEROBIC Blood Culture adequate volume   Culture   Final    NO GROWTH 2 DAYS Performed at [facility], [address], [place], [place] [address]    Report Status PENDING  Incomplete  CULTURE, BLOOD (ROUTINE X 2) w Reflex to ID Panel     Status: Abnormal (Preliminary result)   Collection Time: 12/09/20  6:25 AM   Specimen: BLOOD  Result Value Ref Range Status   Specimen Description   Final    BLOOD RIGHT ASSIST CONTROL Performed at [facility], [address] [place], [place] [address]    Special Requests   Final    BOTTLES DRAWN AEROBIC AND ANAEROBIC Blood Culture adequate volume Performed at [facility], [address] [place], [place] [address]    Culture  Setup Time   Final    AEROBIC BOTTLE ONLY GRAM POSITIVE COCCI IN CLUSTERS CRITICAL RESULT CALLED TO, READ BACK BY AND VERIFIED WITH: DEVIN MITCHELL @0751  ON 06.24.22.SH Performed at [facility], [place] [address], [place], [place] [address]    Culture STAPHYLOCOCCUS EPIDERMIDIS (A)  Final   Report Status PENDING  Incomplete  Blood Culture ID Panel  (Reflexed)     Status: Abnormal   Collection Time: 12/09/20  6:25 AM  Result Value Ref Range Status   Enterococcus faecalis NOT DETECTED NOT DETECTED Final   Enterococcus Faecium NOT DETECTED NOT DETECTED Final   Listeria monocytogenes NOT DETECTED NOT DETECTED Final   Staphylococcus species DETECTED (A) NOT DETECTED Final    Comment: CRITICAL RESULT CALLED TO, READ BACK BY AND VERIFIED WITH: DEVIN MITCHELL @0751  ON 06.24.22.SH    Staphylococcus aureus (BCID) NOT DETECTED NOT DETECTED Final   Staphylococcus epidermidis DETECTED (A) NOT DETECTED Final    Comment: Methicillin (oxacillin) resistant coagulase negative staphylococcus. Possible blood culture contaminant (unless isolated from more than one blood culture draw or clinical case suggests pathogenicity). No antibiotic treatment is indicated for  blood  culture contaminants. CRITICAL RESULT CALLED TO, READ BACK BY AND VERIFIED WITH: DEVIN MITCHELL @0751  ON 06.24.22.SH    Staphylococcus lugdunensis NOT DETECTED NOT DETECTED Final   Streptococcus species NOT DETECTED NOT DETECTED Final   Streptococcus agalactiae NOT DETECTED NOT DETECTED Final   Streptococcus pneumoniae NOT DETECTED NOT DETECTED Final   Streptococcus pyogenes NOT DETECTED NOT DETECTED Final   A.calcoaceticus-baumannii NOT DETECTED NOT DETECTED Final   Bacteroides fragilis NOT DETECTED NOT DETECTED Final   Enterobacterales NOT DETECTED NOT DETECTED Final   Enterobacter cloacae complex NOT DETECTED NOT DETECTED Final   Escherichia coli NOT DETECTED NOT DETECTED Final   Klebsiella aerogenes NOT DETECTED NOT DETECTED Final   Klebsiella oxytoca NOT DETECTED NOT DETECTED Final   Klebsiella pneumoniae NOT DETECTED NOT DETECTED Final   Proteus species NOT DETECTED NOT DETECTED Final   Salmonella species NOT DETECTED NOT DETECTED Final   Serratia marcescens NOT DETECTED NOT DETECTED Final   Haemophilus influenzae NOT DETECTED NOT DETECTED Final   Neisseria meningitidis NOT  DETECTED NOT DETECTED Final   Pseudomonas aeruginosa NOT DETECTED NOT DETECTED Final   Stenotrophomonas maltophilia NOT DETECTED NOT DETECTED Final   Candida albicans NOT DETECTED NOT DETECTED Final   Candida auris NOT DETECTED NOT DETECTED Final   Candida glabrata NOT DETECTED NOT DETECTED Final   Candida krusei NOT DETECTED NOT DETECTED Final   Candida parapsilosis NOT DETECTED NOT DETECTED Final   Candida tropicalis NOT DETECTED NOT DETECTED Final   Cryptococcus neoformans/gattii NOT DETECTED NOT DETECTED Final   Methicillin resistance mecA/C DETECTED (A) NOT DETECTED Final    Comment: CRITICAL RESULT CALLED TO, READ BACK BY AND VERIFIED WITH: DEVIN MITCHELL @0751  ON 06.24.22.SH Performed at [facility], [address] [place], [place] [address]   Urine Culture     Status: Abnormal   Collection Time: 12/09/20 10:17 AM   Specimen: Urine, Random  Result Value Ref Range Status   Specimen Description   Final    URINE, RANDOM Performed at [facility], [address] [place], [place] [address]    Special Requests   Final    NONE Performed at [facility], [address] [place], [place] [address]    Culture (A)  Final    >=100,000 COLONIES/mL VANCOMYCIN RESISTANT ENTEROCOCCUS   Report Status 12/11/2020 FINAL  Final   Organism ID, Bacteria VANCOMYCIN RESISTANT ENTEROCOCCUS (A)  Final      Susceptibility   Vancomycin resistant enterococcus - MIC*    AMPICILLIN >=32 RESISTANT Resistant     NITROFURANTOIN 64 INTERMEDIATE Intermediate     VANCOMYCIN >=32 RESISTANT Resistant     LINEZOLID 2 SENSITIVE Sensitive     * >=100,000 COLONIES/mL VANCOMYCIN RESISTANT ENTEROCOCCUS  CULTURE, BLOOD (ROUTINE X 2) w Reflex to ID Panel     Status: None (Preliminary result)   Collection Time: 12/11/20  4:48 AM   Specimen: BLOOD  Result Value Ref Range Status   Specimen Description BLOOD BLOOD LEFT HAND  Final   Special Requests   Final    BOTTLES DRAWN AEROBIC  AND ANAEROBIC Blood Culture adequate volume   Culture   Final    NO GROWTH <12 HOURS Performed at [facility], [address] [place], [place] [address]    Report Status PENDING  Incomplete  CULTURE, BLOOD (ROUTINE X 2) w Reflex to ID Panel     Status: None (Preliminary result)   Collection Time: 12/11/20  4:57 AM   Specimen: BLOOD  Result Value Ref Range Status   Specimen Description BLOOD RIGHT ANTECUBITAL  Final   Special Requests   Final    BOTTLES DRAWN AEROBIC AND ANAEROBIC Blood Culture results may not be optimal due to an inadequate volume of blood received in culture bottles   Culture   Final    NO GROWTH <12 HOURS Performed at [facility], [address] [place], [place] [address]    Report Status PENDING  Incomplete         Radiology Studies: No results found.      Scheduled Meds:  amLODipine  10 mg Oral Daily   atorvastatin  40 mg Oral Daily   baclofen  5 mg Per Tube TID   hydrALAZINE  10 mg Oral TID   levETIRAcetam  1,500 mg Oral BID   mirtazapine  7.5 mg Oral QHS   pantoprazole  20 mg Oral Daily   polyethylene glycol  17 g Oral Daily   rivaroxaban  10 mg Oral QPM   Continuous Infusions:  sodium chloride 10 mL/hr at 12/10/20 0356   cefTRIAXone (ROCEPHIN)  IV 1 g (12/10/20 2125)     LOS: 6 days     Alma Friendly, MD [facility]  If 7PM-7AM, please contact night-coverage 12/11/2020, 5:23 PM

## 2020-12-11 NOTE — Progress Notes (Signed)
OT Cancellation Note  Patient Details Name: Valerie Bell MRN: [id number] DOB: 09/29/1965   Cancelled Treatment:    Reason Eval/Treat Not Completed: Patient declined, no reason specified Pt in bed - did not answer any of OT questions-she shakes her head - but not appropriate upon questions by OT and close her eyes several times. Upon attempt to get pt up - she did not want to assist to move LE off pillow- upon attempt to assist to do supine to sit - pt resisting OT and pulling /tightening up and closing eyes. Will attempt again depending on schedule.  Rosalyn Gess OTR/L,CLT 12/11/2020, 12:11 PM

## 2020-12-12 LAB — BASIC METABOLIC PANEL
Anion gap: 4 — ABNORMAL LOW (ref 5–15)
BUN: 14 mg/dL (ref 6–20)
CO2: 26 mmol/L (ref 22–32)
Calcium: 8.7 mg/dL — ABNORMAL LOW (ref 8.9–10.3)
Chloride: 109 mmol/L (ref 98–111)
Creatinine, Ser: 0.95 mg/dL (ref 0.44–1.00)
GFR, Estimated: >60 mL/min (ref >60)
Glucose, Bld: 93 mg/dL (ref 70–99)
Potassium: 4 mmol/L (ref 3.5–5.1)
Sodium: 139 mmol/L (ref 135–145)

## 2020-12-12 LAB — CBC WITH DIFFERENTIAL/PLATELET
Abs Immature Granulocytes: 0.07 10*3/uL (ref 0.00–0.07)
Basophils Absolute: 0 10*3/uL (ref 0.0–0.1)
Basophils Relative: 1 %
Eosinophils Absolute: 0.3 10*3/uL (ref 0.0–0.5)
Eosinophils Relative: 5 %
HCT: 30.8 % — ABNORMAL LOW (ref 36.0–46.0)
Hemoglobin: 10 g/dL — ABNORMAL LOW (ref 12.0–15.0)
Immature Granulocytes: 1 %
Lymphocytes Relative: 38 %
Lymphs Abs: 2.1 10*3/uL (ref 0.7–4.0)
MCH: 31.3 pg (ref 26.0–34.0)
MCHC: 32.5 g/dL (ref 30.0–36.0)
MCV: 96.6 fL (ref 80.0–100.0)
Monocytes Absolute: 0.6 10*3/uL (ref 0.1–1.0)
Monocytes Relative: 10 %
Neutro Abs: 2.5 10*3/uL (ref 1.7–7.7)
Neutrophils Relative %: 45 %
Platelets: 102 10*3/uL — ABNORMAL LOW (ref 150–400)
RBC: 3.19 MIL/uL — ABNORMAL LOW (ref 3.87–5.11)
RDW: 13.2 % (ref 11.5–15.5)
WBC: 5.5 10*3/uL (ref 4.0–10.5)
nRBC: 0 % (ref 0.0–0.2)

## 2020-12-12 LAB — CULTURE, BLOOD (ROUTINE X 2): Special Requests: ADEQUATE

## 2020-12-12 MED ORDER — CEPHALEXIN 500 MG PO CAPS: 1000.0000 mg | ORAL_CAPSULE | Freq: Three times a day (TID) | ORAL | 0 refills | Status: AC

## 2020-12-12 NOTE — Plan of Care (Signed)
Continuing with plan of care. 

## 2020-12-12 NOTE — TOC Transition Note (Signed)
Transition of Care [facility]) - CM/SW Discharge Note   Patient Details  Name: Valerie Bell MRN: [id number] Date of Birth: 26-Jul-1965  Transition of [facility]) CM/SW Contact:  Beverly Sessions, RN Phone Number: 12/12/2020, 2:43 PM   Clinical Narrative:     Patient to discharge today [name] with [facility] notified of discharge, and is aware of the address that patient will be discharging too.   Son to transport at discharge Son confirms that he has already taken the [facility] home for discharge.  Bedside RN updated   Final next level of care: Home w Home Health Services Barriers to Discharge: No Barriers Identified   Patient Goals and CMS Choice Patient states their goals for this hospitalization and ongoing recovery are:: Patient not fully oriented   Choice offered to / list presented to : NA  Discharge Placement                       Discharge Plan and Services     Post Acute Care Choice: Resumption of Svcs/PTA Provider, Durable Medical Equipment          DME Arranged: 3-N-1 DME Agency: [employer] Date DME Agency Contacted: 12/07/20   Representative spoke with at DME Agency: Suanne Marker HH Arranged: RN, PT, Speech Therapy HH Agency: [place] Date [place]: 12/06/20   Representative spoke with at [place]: [place] ([place]) Interventions     Readmission Risk Interventions Readmission Risk Prevention Plan 12/12/2020 12/07/2020  Transportation Screening Complete Complete  PCP or Specialist Appt within 5-7 Days - Complete  Home Care Screening - Complete  Medication Review (RN CM) - Complete  HRI or Home Care Consult Complete -  Social Work Consult for [place] Planning/Counseling Complete -  Palliative Care Screening Complete -  Medication Review Press photographer) Complete -  Some recent data might be hidden

## 2020-12-12 NOTE — Plan of Care (Signed)
Discharge teaching completed with patient son, patient is in stable condition.

## 2020-12-12 NOTE — Discharge Summary (Signed)
Discharge Summary  Valerie Bell OZH:[id number] DOB: Sep 30, 1965  PCP: [facility]  Admit date: 12/05/2020 Discharge date: 12/12/2020  Time spent: 40 mins  Recommendations for Outpatient Follow-up:  Follow-up with PCP in 1 week  Discharge Diagnoses:  Active Hospital Problems   Diagnosis Date Noted   Left leg cellulitis 12/05/2020    Resolved Hospital Problems  No resolved problems to display.    Discharge Condition: Stable  Diet recommendation: Heart healthy  Vitals:   12/12/20 0448 12/12/20 0817  BP: 126/90 (!) 141/82  Pulse: 71 71  Resp: 16 16  Temp: 98.3 F (36.8 C) 98 F (36.7 C)  SpO2: 99% 100%    History of present illness:  55 y.o. female with medical history significant for hypertension, systemic lupus erythematosus, CVA and DVT, who presented to the emergency room with acute onset of worsening left lower extremity erythema and swelling with associated warmth and pain.  The patient was recently admitted for acute DVT of  both lower extremities for which she was started on Xarelto.  No chest pain or palpitations.  No cough or wheezing or hemoptysis.  No nausea or vomiting or abdominal pain. Imaging revealed no thrombus.  Patient does have evidence of vascular disease however lower extremity runoff is normal.  No significant lower extremity stenosis is noted. Plain film x-ray with significant soft tissue swelling with no concern for acute osteomyelitis.  Hospital Course:  Active Problems:   Left leg cellulitis   Left lower extremity nonpurulent cellulitis Significant swelling of left lower extremity on presentation Vascular ultrasound shows diffuse nonocclusive thrombosis seen on prior ultrasound, no acute thrombus noted X-ray negative for fracture or dislocation.  Low concern for osteomyelitis S/P IV Rocephin--> switch to Keflex to complete 10 days of AB given severity Follow-up with PCP in 1 week   Acute metabolic encephalopathy Resolved UA  neg, but UC growing > 100, 000 VRE (discussed extensively with ID Dr. Gale Journey on 12/11/20, no further treatment required, could be seen after broad-spectrum antibiotics given) BC X 2, with 1/4 growing methicillin resistant staph epi, likely contaminant, repeat blood culture, NGTD Procalcitonin negative Chest x-ray ordered, pt not cooperative CT head unremarkable Follow-up with PCP   BLE DVT Clot burden appears stable on imaging No concern for acute vascular obstruction Continue Xarelto   Essential hypertension PTA amlodipine and hydralazine   Hyperlipidemia PTA statin   GERD PPI    Estimated body mass index is 22.31 kg/m as calculated from the following:   Height as of this encounter: 5\' 4"  (1.626 m).   Weight as of this encounter: 59 kg.    Procedures: None  Consultations: None  Discharge Exam: BP (!) 141/82 (BP Location: Left Arm)   Pulse 71   Temp 98 F (36.7 C) (Oral)   Resp 16   Ht 5\' 4"  (1.626 m)   Wt 59 kg   LMP 12/29/2001   SpO2 100%   BMI 22.31 kg/m   General: NAD  Cardiovascular: S1, S2 present Respiratory: CTAB Abdomen: Soft, nontender, nondistended, bowel sounds present Musculoskeletal: LLE with trace edema, slightly tender to touch Skin: Normal Psychiatry: Fair mood    Discharge Instructions You were cared for by a hospitalist during your hospital stay. If you have any questions about your discharge medications or the care you received while you were in the hospital after you are discharged, you can call the unit and asked to speak with the hospitalist on call if the hospitalist that took care of  you is not available. Once you are discharged, your primary care physician will handle any further medical issues. Please note that NO REFILLS for any discharge medications will be authorized once you are discharged, as it is imperative that you return to your primary care physician (or establish a relationship with a primary care physician if you do not have  one) for your aftercare needs so that they can reassess your need for medications and monitor your lab values.  Discharge Instructions     Diet - low sodium heart healthy   Complete by: As directed    Increase activity slowly   Complete by: As directed       Allergies as of 12/12/2020       Reactions   Ace Inhibitors Other (See Comments)   Angioedema of face        Medication List     TAKE these medications    amLODipine 10 MG tablet Commonly known as: NORVASC Take 10 mg by mouth daily.   atorvastatin 80 MG tablet Commonly known as: LIPITOR Take 0.5 tablets (40 mg total) by mouth daily.   baclofen 10 MG tablet Commonly known as: LIORESAL Place 5 mg into feeding tube 3 (three) times daily.   cephALEXin 500 MG capsule Commonly known as: KEFLEX Take 2 capsules (1,000 mg total) by mouth every 8 (eight) hours for 3 days.   hydrALAZINE 10 MG tablet Commonly known as: APRESOLINE Take 10 mg by mouth 3 (three) times daily.   hydrocortisone 1 % ointment Apply 1 application topically 2 (two) times daily as needed for itching.   levETIRAcetam 750 MG tablet Commonly known as: KEPPRA Take 1,500 mg by mouth 2 (two) times daily.   mirtazapine 7.5 MG tablet Commonly known as: REMERON Take 7.5 mg by mouth at bedtime.   pantoprazole 20 MG tablet Commonly known as: PROTONIX Take 20 mg by mouth daily.   polyethylene glycol powder 17 GM/SCOOP powder Commonly known as: GLYCOLAX/MIRALAX Take 17 g by mouth daily.   rivaroxaban 10 MG Tabs tablet Commonly known as: XARELTO Take 10 mg by mouth every evening.               Durable Medical Equipment  (From admission, onward)           Start     Ordered   12/07/20 1144  For home use only DME 3 n 1  Once        12/07/20 1143           Allergies  Allergen Reactions   Ace Inhibitors Other (See Comments)    Angioedema of face    Follow-up Information     [facility].   Why: They  will resume home health services at discharge. Contact information: [place] [address] [phone]         [facility]. Schedule an appointment as soon as possible for a visit in 1 week(s).   Contact information: [place] [address] [phone]                  The results of significant diagnostics from this hospitalization (including imaging, microbiology, ancillary and laboratory) are listed below for reference.    Significant Diagnostic Studies: DG Tibia/Fibula Left  Result Date: 12/06/2020 CLINICAL DATA:  Swelling to the left lower extremity. EXAM: LEFT TIBIA AND FIBULA - 2 VIEW COMPARISON:  None. FINDINGS: No acute or suspicious bony abnormality  in the left kidney or fibula. Diffuse soft tissue edema/swelling evident. IMPRESSION: No acute bony abnormality. Electronically Signed   By: Misty Stanley M.D.   On: 12/06/2020 10:50   CT HEAD WO CONTRAST  Result Date: 12/08/2020 CLINICAL DATA:  55 year old female with altered mental status. EXAM: CT HEAD WITHOUT CONTRAST TECHNIQUE: Contiguous axial images were obtained from the base of the skull through the vertex without intravenous contrast. COMPARISON:  Head CT dated 11/17/2020. FINDINGS: Brain: Mild age-related atrophy and chronic microvascular ischemic changes. Small old infarct involving the medial right occipital lobe. There is no acute intracranial hemorrhage. No mass effect or midline shift. No extra-axial fluid collection. Vascular: No hyperdense vessel or unexpected calcification. Skull: Normal. Negative for fracture or focal lesion. Sinuses/Orbits: Mild mucoperiosteal thickening of paranasal sinuses. The mastoid air cells are clear. No air-fluid level. Other: None IMPRESSION: 1. No acute intracranial pathology. 2. Mild age-related atrophy and chronic microvascular ischemic changes. Small old right occipital infarct. Electronically Signed   By: Anner Crete M.D.    On: 12/08/2020 20:22   CT Head Wo Contrast  Result Date: 11/17/2020 CLINICAL DATA:  Mental status change EXAM: CT HEAD WITHOUT CONTRAST TECHNIQUE: Contiguous axial images were obtained from the base of the skull through the vertex without intravenous contrast. COMPARISON:  CT head 09/29/2020 FINDINGS: Brain: Generalized atrophy unchanged. Negative for hydrocephalus. Mild white matter hypodensity bilaterally unchanged. Small chronic infarct right occipital lobe unchanged. 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 Frontal sinuses otherwise well aerated. Negative orbit Other: None IMPRESSION: No acute abnormality. No change from the recent CT. Electronically Signed   By: Franchot Gallo M.D.   On: 11/17/2020 13:17   CT Angio Aortobifemoral W and/or Wo Contrast  Result Date: 12/05/2020 CLINICAL DATA:  Left lower extremity pain and swelling with known history of nonocclusive deep venous thrombosis and lower extremity edema. EXAM: CT ANGIOGRAPHY OF ABDOMINAL AORTA WITH ILIOFEMORAL RUNOFF TECHNIQUE: Multidetector CT imaging of the abdomen, pelvis and lower extremities was performed using the standard protocol during bolus administration of intravenous contrast. Multiplanar CT image reconstructions and MIPs were obtained to evaluate the vascular anatomy. CONTRAST:  158mL OMNIPAQUE IOHEXOL 350 MG/ML SOLN COMPARISON:  CT from 01/28/2015 FINDINGS: VASCULAR Aorta: Abdominal aorta demonstrates mild irregular soft atherosclerotic plaque in the juxtarenal location. No aneurysmal dilatation or dissection is seen. 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 Renals: Both renal arteries are patent without evidence of aneurysm, dissection, vasculitis, fibromuscular dysplasia or significant stenosis. IMA:  Patent without evidence of aneurysm, dissection, vasculitis or significant stenosis. RIGHT Lower Extremity Inflow: Right common and external iliac artery are within normal limits. Runoff: Common femoral artery and femoral bifurcation are widely patent. The superficial femoral and popliteal arteries on the right are within normal limits. Popliteal trifurcation is widely patent on the right with 2 vessel runoff into the foot. The peroneal artery continues to just above the ankle. LEFT Lower Extremity Inflow: Mild calcified plaque is noted at the origin of left common iliac artery. The common iliac artery and external iliac artery are widely patent. Runoff: Common femoral artery and femoral bifurcation are within normal limits. Superficial femoral and popliteal artery are unremarkable. Popliteal trifurcation is within normal limits. Two vessel runoff to the ankle is noted via the anterior tibial and peroneal artery. The posterior tibial artery is occluded distally. Veins: The  inferior vena cava is slit like and may be secondary to chronic occlusion. This would correspond with the known history of prior deep venous thrombosis. The venous structures on the left are slightly more prominent than that on the right consistent with the known chronic thrombus. Review of the MIP images confirms the above findings. NON-VASCULAR Lower chest: Mild basilar atelectasis is noted in the left lower lobe. Hepatobiliary: No focal liver abnormality is seen. No gallstones, gallbladder wall thickening, or biliary dilatation. Pancreas: Pancreas is well visualized. A focal hypodensity is noted in the midportion of the pancreas best seen on image number 39 of series 4 measuring 7.6 mm. This is stable in appearance in retrospect dating back to 2016. Spleen: Normal in size without focal abnormality. Adrenals/Urinary Tract: Adrenal glands are within normal limits. Kidneys demonstrate a normal enhancement pattern bilaterally. No renal calculi are  seen. Hypodensity in the left kidney is noted consistent with a 1 cm cyst. No obstructive changes are noted. The bladder is partially distended. Stomach/Bowel: Scattered fecal material is noted throughout the colon without obstructive or inflammatory changes. The appendix is air-filled and within normal limits. No inflammatory changes are noted. Stomach and small bowel are unremarkable. Lymphatic: No significant lymphadenopathy is noted. Reproductive: Status post hysterectomy. No adnexal masses. Other: No abdominal wall hernia or abnormality. No abdominopelvic ascites. Musculoskeletal: Degenerative changes of lumbar spine are noted. No bony abnormality in the lower extremities is seen. Generalized soft tissue swelling is noted in the left foot and left lower leg consistent with cellulitis. No focal wound is noted. No underlying muscular abnormality is seen. IMPRESSION: VASCULAR Irregular soft atherosclerotic plaque in the abdominal aorta in the juxtarenal location. This is not flow limiting. 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 Lower extremity runoff is within normal limits with 2 vessel runoff to the feet bilaterally as described. No significant stenosis is noted. 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 This would correspond with the known history of lower extremity deep venous thrombosis. This is new from 2016. NON-VASCULAR Diffuse soft tissue swelling in the left lower extremity consistent with the given clinical history. These changes are felt to represent cellulitis. No skin ulcer is seen. No underlying bony or muscular abnormality is seen. Changes suggestive of mild constipation. Chronic changes within the abdomen and pelvis similar to that seen on prior exams. Electronically Signed   By: Inez Catalina M.D.   On: 12/05/2020 21:41   US Venous Img Lower Unilateral Left  Result Date: 12/05/2020 CLINICAL DATA:  Left leg pain and  swelling for 2 days EXAM: LEFT LOWER EXTREMITY VENOUS DOPPLER ULTRASOUND TECHNIQUE: Gray-scale sonography with graded compression, as well as color Doppler and duplex ultrasound were performed to evaluate the lower extremity deep venous systems from the level of the common femoral vein and including the common femoral, femoral, profunda femoral, popliteal and calf veins including the posterior tibial, peroneal and gastrocnemius veins when visible. The superficial great saphenous vein was also interrogated. Spectral Doppler was utilized to evaluate flow at rest and with distal augmentation maneuvers in the common femoral, femoral and popliteal veins. COMPARISON:  11/07/2020 FINDINGS: Contralateral Common Femoral Vein: Nonocclusive thrombus is noted with decreased compressibility. Common Femoral Vein: Nonocclusive thrombus is noted with decreased compressibility. Saphenofemoral Junction: Nonocclusive thrombus is noted with decreased compressibility. Profunda Femoral Vein: No evidence of thrombus. Normal compressibility and flow on color Doppler imaging. Femoral Vein: Nonocclusive thrombus is noted with decreased compressibility. Popliteal Vein:  Nonocclusive thrombus is noted with decreased compressibility. Calf Veins: Nonocclusive thrombus is noted with decreased compressibility. Peroneal vein is not well visualized. Superficial Great Saphenous Vein: No evidence of thrombus. Normal compressibility. Venous Reflux:  None. Other Findings:  Calf edema is noted IMPRESSION: Diffuse nonocclusive thrombus in the visualized venous structures. The overall appearance is similar to that seen on prior ultrasound from 11/07/2020. No definitive acute thrombus is noted. Electronically Signed   By: Inez Catalina M.D.   On: 12/05/2020 19:13   DG Chest Portable 1 View  Result Date: 11/17/2020 CLINICAL DATA:  Altered mental status. EXAM: PORTABLE CHEST 1 VIEW COMPARISON:  CT 09/12/2020.  Chest x-ray 11/07/2020. FINDINGS: Patient is  rotated to the right. Mediastinum hilar structures stable. Tortuous thoracic aorta again noted. Low lung volumes with mild bibasilar atelectasis/infiltrates. No pleural effusion or pneumothorax. IMPRESSION: Lung volumes with mild bibasilar atelectasis/infiltrates. No pleural effusion or pneumothorax. Electronically Signed   By: Marcello Moores  Register   On: 11/17/2020 09:33   DG Foot 2 Views Left  Result Date: 12/06/2020 CLINICAL DATA:  LEFT foot pain and swelling. EXAM: LEFT FOOT - 2 VIEW COMPARISON:  None. FINDINGS: There is no evidence of fracture, subluxation or dislocation. No focal bony lesions are present. Diffuse soft tissue swelling is noted. IMPRESSION: Diffuse soft tissue swelling without bony abnormality. Electronically Signed   By: Margarette Canada M.D.   On: 12/06/2020 10:35    Microbiology: Recent Results (from the past 240 hour(s))  Resp Panel by RT-PCR (Flu A&B, Covid) Nasopharyngeal Swab     Status: None   Collection Time: 12/05/20 10:30 PM   Specimen: Nasopharyngeal Swab; Nasopharyngeal(NP) swabs in vial transport medium  Result Value Ref Range Status   SARS Coronavirus 2 by RT PCR NEGATIVE NEGATIVE Final    Comment: (NOTE) SARS-CoV-2 target nucleic acids are NOT DETECTED.  The SARS-CoV-2 RNA is generally detectable in upper respiratory specimens during the acute phase of infection. The lowest concentration of SARS-CoV-2 viral copies this assay can detect is 138 copies/mL. A negative result does not preclude SARS-Cov-2 infection and should not be used as the sole basis for treatment or other patient management decisions. A negative result may occur with  improper specimen collection/handling, submission of specimen other than nasopharyngeal swab, presence of viral mutation(s) within the areas targeted by this assay, and inadequate number of viral copies(<138 copies/mL). A negative result must be combined with clinical observations, patient history, and epidemiological information.  The expected result is Negative.  Fact Sheet for Patients:  EntrepreneurPulse.com.au  Fact Sheet for Healthcare Providers:  IncredibleEmployment.be  This test is no t yet approved or cleared by the [place] FDA and  has been authorized for detection and/or diagnosis of SARS-CoV-2 by FDA under an Emergency Use Authorization (EUA). This EUA will remain  in effect (meaning this test can be used) for the duration of the COVID-19 declaration under Section 564(b)(1) of the Act, 21 U.S.C.section 360bbb-3(b)(1), unless the authorization is terminated  or revoked sooner.       Influenza A by PCR NEGATIVE NEGATIVE Final   Influenza B by PCR NEGATIVE NEGATIVE Final    Comment: (NOTE) The Xpert Xpress SARS-CoV-2/FLU/RSV plus assay is intended as an aid in the diagnosis of influenza from Nasopharyngeal swab specimens and should not be used as a sole basis for treatment. Nasal washings and aspirates are unacceptable for Xpert Xpress SARS-CoV-2/FLU/RSV testing.  Fact Sheet for Patients: EntrepreneurPulse.com.au  Fact Sheet for Healthcare Providers: IncredibleEmployment.be  This test is not yet  approved or cleared by the [place] and has been authorized for detection and/or diagnosis of SARS-CoV-2 by FDA under an Emergency Use Authorization (EUA). This EUA will remain in effect (meaning this test can be used) for the duration of the COVID-19 declaration under Section 564(b)(1) of the Act, 21 U.S.C. section 360bbb-3(b)(1), unless the authorization is terminated or revoked.  Performed at [facility], [address] [place], [place] [address]   CULTURE, BLOOD (ROUTINE X 2) w Reflex to ID Panel     Status: None (Preliminary result)   Collection Time: 12/09/20  6:24 AM   Specimen: BLOOD  Result Value Ref Range Status   Specimen Description BLOOD LEFT ASSIST CONTROL  Final   Special Requests    Final    BOTTLES DRAWN AEROBIC AND ANAEROBIC Blood Culture adequate volume   Culture   Final    NO GROWTH 3 DAYS Performed at [facility], [address], [place], [place] [address]    Report Status PENDING  Incomplete  CULTURE, BLOOD (ROUTINE X 2) w Reflex to ID Panel     Status: Abnormal   Collection Time: 12/09/20  6:25 AM   Specimen: BLOOD  Result Value Ref Range Status   Specimen Description   Final    BLOOD RIGHT ASSIST CONTROL Performed at [facility], [address], [place], [place] [address]    Special Requests   Final    BOTTLES DRAWN AEROBIC AND ANAEROBIC Blood Culture adequate volume Performed at [facility], [address] [place], [place] [address]    Culture  Setup Time   Final    AEROBIC BOTTLE ONLY GRAM POSITIVE COCCI IN CLUSTERS CRITICAL RESULT CALLED TO, READ BACK BY AND VERIFIED WITH: DEVIN MITCHELL @0751  ON 06.24.22.SH    Culture (A)  Final    STAPHYLOCOCCUS EPIDERMIDIS THE SIGNIFICANCE OF ISOLATING THIS ORGANISM FROM A SINGLE SET OF BLOOD CULTURES WHEN MULTIPLE SETS ARE DRAWN IS UNCERTAIN. PLEASE NOTIFY THE MICROBIOLOGY DEPARTMENT WITHIN ONE WEEK IF SPECIATION AND SENSITIVITIES ARE REQUIRED. Performed at [facility], [place] [address] [place],  [address]    Report Status 12/12/2020 FINAL  Final  Blood Culture ID Panel (Reflexed)     Status: Abnormal   Collection Time: 12/09/20  6:25 AM  Result Value Ref Range Status   Enterococcus faecalis NOT DETECTED NOT DETECTED Final   Enterococcus Faecium NOT DETECTED NOT DETECTED Final   Listeria monocytogenes NOT DETECTED NOT DETECTED Final   Staphylococcus species DETECTED (A) NOT DETECTED Final    Comment: CRITICAL RESULT CALLED TO, READ BACK BY AND VERIFIED WITH: DEVIN MITCHELL @0751  ON 06.24.22.SH    Staphylococcus aureus (BCID) NOT DETECTED NOT DETECTED Final   Staphylococcus epidermidis DETECTED (A) NOT DETECTED Final    Comment: Methicillin (oxacillin)  resistant coagulase negative staphylococcus. Possible blood culture contaminant (unless isolated from more than one blood culture draw or clinical case suggests pathogenicity). No antibiotic treatment is indicated for blood  culture contaminants. CRITICAL RESULT CALLED TO, READ BACK BY AND VERIFIED WITH: DEVIN MITCHELL @0751  ON 06.24.22.SH    Staphylococcus lugdunensis NOT DETECTED NOT DETECTED Final   Streptococcus species NOT DETECTED NOT DETECTED Final   Streptococcus agalactiae NOT DETECTED NOT DETECTED Final   Streptococcus pneumoniae NOT DETECTED NOT DETECTED Final   Streptococcus pyogenes NOT DETECTED NOT DETECTED Final   A.calcoaceticus-baumannii NOT DETECTED NOT DETECTED Final   Bacteroides fragilis NOT DETECTED NOT DETECTED Final   Enterobacterales NOT DETECTED NOT DETECTED Final  Enterobacter cloacae complex NOT DETECTED NOT DETECTED Final   Escherichia coli NOT DETECTED NOT DETECTED Final   Klebsiella aerogenes NOT DETECTED NOT DETECTED Final   Klebsiella oxytoca NOT DETECTED NOT DETECTED Final   Klebsiella pneumoniae NOT DETECTED NOT DETECTED Final   Proteus species NOT DETECTED NOT DETECTED Final   Salmonella species NOT DETECTED NOT DETECTED Final   Serratia marcescens NOT DETECTED NOT DETECTED Final   Haemophilus influenzae NOT DETECTED NOT DETECTED Final   Neisseria meningitidis NOT DETECTED NOT DETECTED Final   Pseudomonas aeruginosa NOT DETECTED NOT DETECTED Final   Stenotrophomonas maltophilia NOT DETECTED NOT DETECTED Final   Candida albicans NOT DETECTED NOT DETECTED Final   Candida auris NOT DETECTED NOT DETECTED Final   Candida glabrata NOT DETECTED NOT DETECTED Final   Candida krusei NOT DETECTED NOT DETECTED Final   Candida parapsilosis NOT DETECTED NOT DETECTED Final   Candida tropicalis NOT DETECTED NOT DETECTED Final   Cryptococcus neoformans/gattii NOT DETECTED NOT DETECTED Final   Methicillin resistance mecA/C DETECTED (A) NOT DETECTED Final     Comment: CRITICAL RESULT CALLED TO, READ BACK BY AND VERIFIED WITH: DEVIN MITCHELL @0751  ON 06.24.22.SH Performed at [facility], [address], [place], [place] [address]   Urine Culture     Status: Abnormal   Collection Time: 12/09/20 10:17 AM   Specimen: Urine, Random  Result Value Ref Range Status   Specimen Description   Final    URINE, RANDOM Performed at [facility], [address] [place], [place] [address]    Special Requests   Final    NONE Performed at [facility], [address] [place], [place] [address]    Culture (A)  Final    >=100,000 COLONIES/mL VANCOMYCIN RESISTANT ENTEROCOCCUS   Report Status 12/11/2020 FINAL  Final   Organism ID, Bacteria VANCOMYCIN RESISTANT ENTEROCOCCUS (A)  Final      Susceptibility   Vancomycin resistant enterococcus - MIC*    AMPICILLIN >=32 RESISTANT Resistant     NITROFURANTOIN 64 INTERMEDIATE Intermediate     VANCOMYCIN >=32 RESISTANT Resistant     LINEZOLID 2 SENSITIVE Sensitive     * >=100,000 COLONIES/mL VANCOMYCIN RESISTANT ENTEROCOCCUS  CULTURE, BLOOD (ROUTINE X 2) w Reflex to ID Panel     Status: None (Preliminary result)   Collection Time: 12/11/20  4:48 AM   Specimen: BLOOD  Result Value Ref Range Status   Specimen Description BLOOD BLOOD LEFT HAND  Final   Special Requests   Final    BOTTLES DRAWN AEROBIC AND ANAEROBIC Blood Culture adequate volume   Culture   Final    NO GROWTH 1 DAY Performed at [facility], [address], [place], [place] [address]    Report Status PENDING  Incomplete  CULTURE, BLOOD (ROUTINE X 2) w Reflex to ID Panel     Status: None (Preliminary result)   Collection Time: 12/11/20  4:57 AM   Specimen: BLOOD  Result Value Ref Range Status   Specimen Description BLOOD RIGHT ANTECUBITAL  Final   Special Requests   Final    BOTTLES DRAWN AEROBIC AND ANAEROBIC Blood Culture results may not be optimal due to an inadequate volume of blood received in  culture bottles   Culture   Final    NO GROWTH 1 DAY Performed at [facility], [address], [place],  [address]    Report Status PENDING  Incomplete     Labs: Basic Metabolic Panel: Recent Labs  Lab  12/06/20 7902 12/09/20 0625 12/10/20 0706 12/11/20 0448 12/12/20 0601  NA 135 140 139 138 139  K 4.0 3.9 4.1 4.1 4.0  CL 103 104 109 107 109  CO2 23 24 24 26 26   GLUCOSE 85 72 94 89 93  BUN 14 11 14 12 14   CREATININE 0.78 0.90 0.79 0.96 0.95  CALCIUM 8.9 9.3 9.2 8.9 8.7*   Liver Function Tests: Recent Labs  Lab 12/05/20 1844  AST 27  ALT 22  ALKPHOS 59  BILITOT 0.7  PROT 9.0*  ALBUMIN 3.6   Recent Labs  Lab 12/05/20 1844  LIPASE 30   Recent Labs  Lab 12/05/20 2006  AMMONIA 13   CBC: Recent Labs  Lab 12/05/20 1844 12/06/20 4097 12/08/20 0518 12/09/20 0625 12/10/20 0706 12/11/20 0448 12/12/20 0601  WBC 13.3*   < > 10.0 9.4 6.1 6.5 5.5  NEUTROABS 11.9*  --   --  6.5 3.3 3.4 2.5  HGB 11.7*   < > 11.5* 12.8 11.3* 11.0* 10.0*  HCT 36.6   < > 34.5* 39.9 34.6* 34.4* 30.8*  MCV 96.8   < > 93.8 96.4 94.0 96.4 96.6  PLT 87*   < > 130* 157 114* 119* 102*   < > = values in this interval not displayed.   Cardiac Enzymes: No results for input(s): CKTOTAL, CKMB, CKMBINDEX, TROPONINI in the last 168 hours. BNP: BNP (last 3 results) Recent Labs    09/12/20 1257 11/07/20 2014  BNP 36.2 90.1    ProBNP (last 3 results) No results for input(s): PROBNP in the last 8760 hours.  CBG: No results for input(s): GLUCAP in the last 168 hours.     Signed:  Alma Friendly, MD [facility] 12/12/2020, 2:12 PM

## 2020-12-14 LAB — CULTURE, BLOOD (ROUTINE X 2)
Culture: NO GROWTH
Special Requests: ADEQUATE

## 2020-12-16 LAB — CULTURE, BLOOD (ROUTINE X 2)
Culture: NO GROWTH
Culture: NO GROWTH
Special Requests: ADEQUATE

## 2021-04-08 ENCOUNTER — Other Ambulatory Visit: Payer: Self-pay

## 2021-04-08 ENCOUNTER — Emergency Department: Payer: Medicare Other

## 2021-04-08 DIAGNOSIS — S01511A Laceration without foreign body of lip, initial encounter: Secondary | ICD-10-CM | POA: Insufficient documentation

## 2021-04-08 DIAGNOSIS — W19XXXA Unspecified fall, initial encounter: Secondary | ICD-10-CM | POA: Diagnosis not present

## 2021-04-08 DIAGNOSIS — Z5321 Procedure and treatment not carried out due to patient leaving prior to being seen by health care provider: Secondary | ICD-10-CM | POA: Diagnosis not present

## 2021-04-08 DIAGNOSIS — Y92009 Unspecified place in unspecified non-institutional (private) residence as the place of occurrence of the external cause: Secondary | ICD-10-CM | POA: Insufficient documentation

## 2021-04-08 DIAGNOSIS — S0990XA Unspecified injury of head, initial encounter: Secondary | ICD-10-CM | POA: Diagnosis present

## 2021-04-08 NOTE — ED Triage Notes (Signed)
Pt was at home and stated that she was going to get up and leave the house. Pt cannot walk per family member and losses balance without her walker. Denies LOC.  Has small lower lip laceration. One tooth that is very loose and one of the lower teeth appear chipped.

## 2021-04-09 ENCOUNTER — Emergency Department
Admission: EM | Admit: 2021-04-09 | Discharge: 2021-04-09 | Disposition: A | Discharge status: Home / Self Care | Payer: Medicare Other | Attending: Emergency Medicine | Admitting: Emergency Medicine

## 2021-04-09 NOTE — ED Notes (Signed)
Pt called multiple times for room, no answer, not visualized in lobby.

## 2021-04-21 ENCOUNTER — Other Ambulatory Visit: Payer: Self-pay

## 2021-04-21 ENCOUNTER — Encounter: Payer: Self-pay | Admitting: Emergency Medicine

## 2021-04-21 ENCOUNTER — Emergency Department
Admission: EM | Admit: 2021-04-21 | Discharge: 2021-04-21 | Disposition: A | Discharge status: Home / Self Care | Payer: Medicare Other | Attending: Emergency Medicine | Admitting: Emergency Medicine

## 2021-04-21 ENCOUNTER — Emergency Department: Payer: Medicare Other

## 2021-04-21 DIAGNOSIS — L03115 Cellulitis of right lower limb: Secondary | ICD-10-CM | POA: Insufficient documentation

## 2021-04-21 DIAGNOSIS — I10 Essential (primary) hypertension: Secondary | ICD-10-CM | POA: Insufficient documentation

## 2021-04-21 DIAGNOSIS — Z79899 Other long term (current) drug therapy: Secondary | ICD-10-CM | POA: Insufficient documentation

## 2021-04-21 DIAGNOSIS — Z87891 Personal history of nicotine dependence: Secondary | ICD-10-CM | POA: Diagnosis not present

## 2021-04-21 DIAGNOSIS — M7989 Other specified soft tissue disorders: Secondary | ICD-10-CM | POA: Diagnosis present

## 2021-04-21 DIAGNOSIS — Z7901 Long term (current) use of anticoagulants: Secondary | ICD-10-CM | POA: Insufficient documentation

## 2021-04-21 DIAGNOSIS — L0390 Cellulitis, unspecified: Secondary | ICD-10-CM

## 2021-04-21 LAB — BASIC METABOLIC PANEL
Anion gap: 10 (ref 5–15)
BUN: 9 mg/dL (ref 6–20)
CO2: 25 mmol/L (ref 22–32)
Calcium: 9 mg/dL (ref 8.9–10.3)
Chloride: 102 mmol/L (ref 98–111)
Creatinine, Ser: 0.9 mg/dL (ref 0.44–1.00)
GFR, Estimated: >60 mL/min (ref >60)
Glucose, Bld: 85 mg/dL (ref 70–99)
Potassium: 3.5 mmol/L (ref 3.5–5.1)
Sodium: 137 mmol/L (ref 135–145)

## 2021-04-21 LAB — CBC
HCT: 34.8 % — ABNORMAL LOW (ref 36.0–46.0)
Hemoglobin: 11.1 g/dL — ABNORMAL LOW (ref 12.0–15.0)
MCH: 29.4 pg (ref 26.0–34.0)
MCHC: 31.9 g/dL (ref 30.0–36.0)
MCV: 92.1 fL (ref 80.0–100.0)
Platelets: 102 10*3/uL — ABNORMAL LOW (ref 150–400)
RBC: 3.78 MIL/uL — ABNORMAL LOW (ref 3.87–5.11)
RDW: 14 % (ref 11.5–15.5)
WBC: 10.1 10*3/uL (ref 4.0–10.5)
nRBC: 0 % (ref 0.0–0.2)

## 2021-04-21 MED ORDER — VANCOMYCIN HCL IN DEXTROSE 1-5 GM/200ML-% IV SOLN
1000.0000 mg | Freq: Once | INTRAVENOUS | Status: AC
  Administered 2021-04-21: 1000 mg via INTRAVENOUS
  Filled 2021-04-21: qty 200

## 2021-04-21 MED ORDER — DOXYCYCLINE HYCLATE 100 MG PO CAPS: 100.0000 mg | ORAL_CAPSULE | Freq: Two times a day (BID) | ORAL | 0 refills | Status: AC

## 2021-04-21 NOTE — Discharge Instructions (Signed)
Please seek medical attention for any high fevers, chest pain, shortness of breath, change in behavior, persistent vomiting, bloody stool or any other new or concerning symptoms.  

## 2021-04-21 NOTE — ED Provider Notes (Signed)
[name]  ____________________________________________   I have reviewed the triage vital signs and the nursing notes.   HISTORY  Chief Complaint Leg Swelling   History limited by: Not Limited   HPI Valerie Bell is a 55 y.o. female who presents to the emergency department today because of concerns for right leg swelling.  Family first noticed it today.  They are concerned because patient has a history of DVTs and they do think it is quite possible she has missed couple doses of her blood thinners.  Patient has had discomfort associated with the swelling. patient denies any chest pain or shortness of breath.   Records reviewed. Per medical record review patient has a history of DVT, lupus.   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   Spinal cord lesion ([place])    Stroke [facility])     Patient Active Problem List   Diagnosis Date Noted   Left leg cellulitis 12/05/2020   UTI (urinary tract infection) 11/17/2020   Spinal cord lesion (HCC)    Stroke (HCC)    Thrombocytopenia (HCC)    Sacral pressure ulcer    Depression    AKI (acute kidney injury) ([place])    Normocytic anemia 11/09/2020   Dyslipidemia 07/08/2019   Acute deep vein thrombosis (DVT) of left lower extremity ([place]) 06/20/2019   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[place]) 06/20/2019   Gastrostomy tube in place ([place]) 06/20/2019   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   PRES (posterior reversible encephalopathy syndrome) 02/24/2019   Dysphagia 02/20/2019   Chronic deep vein thrombosis (DVT) of femoral vein of right lower extremity ([place]) 09/04/2018   Pressure injury of skin 54/56/2563   Acute metabolic encephalopathy 89/37/3428   Encephalitis 08/27/2018   Benign  essential HTN 08/27/2018   Leucocytosis 08/27/2018   Lupus ([place]) 08/27/2018   DVT (deep vein thrombosis) in pregnancy 08/27/2018   Lethargy 08/24/2018   Weakness 01/07/2018   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 Authorizing Provider  amLODipine (NORVASC) 10 MG tablet Take 10 mg by mouth daily. 11/25/20   [provider]  atorvastatin (LIPITOR) 80 MG tablet Take 0.5 tablets (40 mg total) by mouth daily. 11/20/20   Annita Brod, MD  baclofen (LIORESAL) 10 MG tablet Place 5 mg into feeding tube 3 (three) times daily. 09/20/20   [provider]  hydrALAZINE (APRESOLINE) 10 MG tablet Take 10 mg by mouth 3 (three) times daily.    [provider]  hydrocortisone 1 % ointment Apply 1 application topically 2 (two) times daily as needed for itching.    [provider]  levETIRAcetam (KEPPRA) 750 MG tablet Take 1,500 mg by mouth 2 (two) times daily.    [provider]  mirtazapine (REMERON) 7.5 MG tablet Take 7.5 mg by mouth at bedtime.    [provider]  pantoprazole (PROTONIX) 20 MG tablet Take 20 mg by mouth daily.    [provider]  polyethylene glycol powder (GLYCOLAX/MIRALAX) 17 GM/SCOOP powder Take 17 g by mouth daily. 09/13/18   [provider]  rivaroxaban (XARELTO) 10 MG TABS tablet Take 10 mg by mouth every evening.    [provider]    Allergies Ace inhibitors  Family History  Problem Relation Age of Onset   Arthritis Mother    Hypertension Mother    Breast cancer Neg Hx     Social History Social History   Tobacco Use   Smoking status: Former    Packs/day: 0.30    Years: 4.00    Pack years: 1.20    Types:  Cigarettes    Quit date: 12/30/2003    Years since quitting: 17.3   Smokeless tobacco: Never  Substance Use Topics   Alcohol use: No    Alcohol/week: 0.0 standard drinks   Drug use: Yes    Types: Marijuana    Comment: 2 days ago.     Review of Systems Constitutional: No fever/chills Eyes: No visual changes. ENT: No sore throat. Cardiovascular: Denies chest pain. Respiratory: Denies shortness of breath. Gastrointestinal: No abdominal pain.  No nausea, no vomiting.  No diarrhea.   Genitourinary: Negative for dysuria. Musculoskeletal: Positive for swelling to right leg.  Skin: Negative for rash. Neurological: Negative for headaches, focal weakness or numbness.  ____________________________________________   PHYSICAL EXAM:  VITAL SIGNS: ED Triage Vitals  Enc Vitals Group     BP 04/21/21 1307 (!) 171/101     Pulse Rate 04/21/21 1307 89     Resp 04/21/21 1307 18     Temp 04/21/21 1307 99.6 F (37.6 C)     Temp Source 04/21/21 1307 Oral     SpO2 04/21/21 1307 96 %     Weight 04/21/21 1309 130 lb 1.1 oz (59 kg)     Height 04/21/21 1309 _0  (1.626 m)     Head Circumference --      Peak Flow --      Pain Score 04/21/21 1308 10    Constitutional: Alert and oriented.  Eyes: Conjunctivae are normal.  ENT      Head: Normocephalic and atraumatic.      Nose: No congestion/rhinnorhea.      Mouth/Throat: Mucous membranes are moist.      Neck: No stridor. Hematological/Lymphatic/Immunilogical: No cervical lymphadenopathy. Cardiovascular: Normal rate, regular rhythm.  No murmurs, rubs, or gallops.  Respiratory: Normal respiratory effort without tachypnea nor retractions. Breath sounds are clear and equal bilaterally. No wheezes/rales/rhonchi. Gastrointestinal: Soft and non tender. No rebound. No guarding.  Genitourinary: Deferred Musculoskeletal: Right leg with edema.  Neurologic:  Normal speech and language. No gross focal neurologic deficits are appreciated.  Skin:  Erythema.  Psychiatric: Mood and affect are normal. Speech and behavior are normal. Patient exhibits appropriate insight and judgment.  ____________________________________________    LABS (pertinent positives/negatives)  CBC wbc 10.1, hgb 11.1, plt 102 BMP wnl  ____________________________________________   EKG  None  ____________________________________________    RADIOLOGY  Right lower extremity US Chronic DVT. No acute DVT.  ____________________________________________   PROCEDURES  Procedures  ____________________________________________   INITIAL IMPRESSION / ASSESSMENT AND PLAN / ED COURSE  Pertinent labs & imaging results that were available during my care of the patient were reviewed by me and considered in my medical decision making (see chart for details).   Patient presents to the emergency department today because of concerns for right leg swelling.  Ultrasound shows a chronic DVT is similar in appearance from previous studies.  No acute DVTs.  At this point given warmth and erythema I am concerned for some cellulitis.  Patient without any leukocytosis.  She is afebrile here.  Will plan on giving dose of antibiotics here in the emergency department via IV and then discharged home with further antibiotics.  Discussed with patient.  ___________________________________________   FINAL CLINICAL IMPRESSION(S) / ED DIAGNOSES  Final diagnoses:  Cellulitis, unspecified cellulitis site     Note: This dictation was prepared with Dragon dictation. Any transcriptional errors that result from this process are unintentional     Nance Pear, MD 04/21/21 1955

## 2021-04-21 NOTE — ED Triage Notes (Signed)
Pt comes into the ED via POV c/o leg swelling.  Pt has noticeable swelling in the right leg that has been getting worse over the past 2 days.  Pt is now c/o bilateral leg pain as well.  Pt denies any chest pain, SHOB, or dizziness.  Pt denies any CHF.  Pt does take xarelto for previous blood clots.  Pt in NAD at this time with even and unlabored respirations.

## 2021-05-30 ENCOUNTER — Emergency Department: Payer: Medicare Other

## 2021-05-30 ENCOUNTER — Emergency Department
Admission: EM | Admit: 2021-05-30 | Discharge: 2021-06-19 | Disposition: E | Payer: Medicare Other | Attending: Emergency Medicine | Admitting: Emergency Medicine

## 2021-05-30 ENCOUNTER — Other Ambulatory Visit: Payer: Self-pay

## 2021-05-30 DIAGNOSIS — I959 Hypotension, unspecified: Secondary | ICD-10-CM | POA: Diagnosis not present

## 2021-05-30 DIAGNOSIS — T68XXXA Hypothermia, initial encounter: Secondary | ICD-10-CM | POA: Diagnosis not present

## 2021-05-30 DIAGNOSIS — I619 Nontraumatic intracerebral hemorrhage, unspecified: Secondary | ICD-10-CM

## 2021-05-30 DIAGNOSIS — I618 Other nontraumatic intracerebral hemorrhage: Secondary | ICD-10-CM | POA: Diagnosis not present

## 2021-05-30 DIAGNOSIS — I10 Essential (primary) hypertension: Secondary | ICD-10-CM | POA: Diagnosis not present

## 2021-05-30 DIAGNOSIS — E872 Acidosis, unspecified: Secondary | ICD-10-CM | POA: Diagnosis not present

## 2021-05-30 DIAGNOSIS — I214 Non-ST elevation (NSTEMI) myocardial infarction: Secondary | ICD-10-CM | POA: Insufficient documentation

## 2021-05-30 DIAGNOSIS — I469 Cardiac arrest, cause unspecified: Secondary | ICD-10-CM | POA: Diagnosis present

## 2021-05-30 DIAGNOSIS — X31XXXA Exposure to excessive natural cold, initial encounter: Secondary | ICD-10-CM | POA: Diagnosis not present

## 2021-05-30 DIAGNOSIS — J690 Pneumonitis due to inhalation of food and vomit: Secondary | ICD-10-CM | POA: Diagnosis not present

## 2021-05-30 DIAGNOSIS — Z20822 Contact with and (suspected) exposure to covid-19: Secondary | ICD-10-CM | POA: Insufficient documentation

## 2021-05-30 DIAGNOSIS — Z7901 Long term (current) use of anticoagulants: Secondary | ICD-10-CM | POA: Diagnosis not present

## 2021-05-30 DIAGNOSIS — Y900 Blood alcohol level of less than 20 mg/100 ml: Secondary | ICD-10-CM | POA: Diagnosis not present

## 2021-05-30 DIAGNOSIS — Z79899 Other long term (current) drug therapy: Secondary | ICD-10-CM | POA: Insufficient documentation

## 2021-05-30 DIAGNOSIS — Z87891 Personal history of nicotine dependence: Secondary | ICD-10-CM | POA: Diagnosis not present

## 2021-05-30 LAB — CBC WITH DIFFERENTIAL/PLATELET
Abs Immature Granulocytes: 0.1 10*3/uL — ABNORMAL HIGH (ref 0.00–0.07)
Basophils Absolute: 0 10*3/uL (ref 0.0–0.1)
Basophils Relative: 0 %
Eosinophils Absolute: 0 10*3/uL (ref 0.0–0.5)
Eosinophils Relative: 0 %
HCT: 35.9 % — ABNORMAL LOW (ref 36.0–46.0)
Hemoglobin: 11.1 g/dL — ABNORMAL LOW (ref 12.0–15.0)
Immature Granulocytes: 1 %
Lymphocytes Relative: 11 %
Lymphs Abs: 1.8 10*3/uL (ref 0.7–4.0)
MCH: 30.3 pg (ref 26.0–34.0)
MCHC: 30.9 g/dL (ref 30.0–36.0)
MCV: 98.1 fL (ref 80.0–100.0)
Monocytes Absolute: 0.5 10*3/uL (ref 0.1–1.0)
Monocytes Relative: 3 %
Neutro Abs: 14 10*3/uL — ABNORMAL HIGH (ref 1.7–7.7)
Neutrophils Relative %: 85 %
Platelets: 62 10*3/uL — ABNORMAL LOW (ref 150–400)
RBC: 3.66 MIL/uL — ABNORMAL LOW (ref 3.87–5.11)
RDW: 14.7 % (ref 11.5–15.5)
WBC: 16.5 10*3/uL — ABNORMAL HIGH (ref 4.0–10.5)
nRBC: 0 % (ref 0.0–0.2)

## 2021-05-30 LAB — URINALYSIS, ROUTINE W REFLEX MICROSCOPIC
Bilirubin Urine: NEGATIVE
Glucose, UA: 150 mg/dL — AB
Ketones, ur: NEGATIVE mg/dL
Leukocytes,Ua: NEGATIVE
Nitrite: NEGATIVE
Protein, ur: >=300 mg/dL — AB
RBC / HPF: >50 RBC/hpf — ABNORMAL HIGH (ref 0–5)
Specific Gravity, Urine: 1.026 (ref 1.005–1.030)
pH: 7 (ref 5.0–8.0)

## 2021-05-30 LAB — TROPONIN I (HIGH SENSITIVITY)
Troponin I (High Sensitivity): 2061 ng/L (ref <18)
Troponin I (High Sensitivity): 3574 ng/L (ref <18)

## 2021-05-30 LAB — COMPREHENSIVE METABOLIC PANEL
ALT: 18 U/L (ref 0–44)
AST: 65 U/L — ABNORMAL HIGH (ref 15–41)
Albumin: 2.7 g/dL — ABNORMAL LOW (ref 3.5–5.0)
Alkaline Phosphatase: 60 U/L (ref 38–126)
Anion gap: 12 (ref 5–15)
BUN: 17 mg/dL (ref 6–20)
CO2: 17 mmol/L — ABNORMAL LOW (ref 22–32)
Calcium: 8 mg/dL — ABNORMAL LOW (ref 8.9–10.3)
Chloride: 105 mmol/L (ref 98–111)
Creatinine, Ser: 1.35 mg/dL — ABNORMAL HIGH (ref 0.44–1.00)
GFR, Estimated: 47 mL/min — ABNORMAL LOW (ref >60)
Glucose, Bld: 266 mg/dL — ABNORMAL HIGH (ref 70–99)
Potassium: 3.1 mmol/L — ABNORMAL LOW (ref 3.5–5.1)
Sodium: 134 mmol/L — ABNORMAL LOW (ref 135–145)
Total Bilirubin: 0.9 mg/dL (ref 0.3–1.2)
Total Protein: 7.3 g/dL (ref 6.5–8.1)

## 2021-05-30 LAB — HEPARIN LEVEL (UNFRACTIONATED): Heparin Unfractionated: <0.1 IU/mL — ABNORMAL LOW (ref 0.30–0.70)

## 2021-05-30 LAB — BLOOD GAS, ARTERIAL
Acid-base deficit: 7.6 mmol/L — ABNORMAL HIGH (ref 0.0–2.0)
Bicarbonate: 18.2 mmol/L — ABNORMAL LOW (ref 20.0–28.0)
FIO2: 0.6
MECHVT: 380 mL
Mechanical Rate: 18
O2 Saturation: 94.1 %
PEEP: 5 cmH2O
Patient temperature: 33.9
RATE: 18 resp/min
pCO2 arterial: 32 mmHg (ref 32.0–48.0)
pH, Arterial: 7.34 — ABNORMAL LOW (ref 7.350–7.450)
pO2, Arterial: 64 mmHg — ABNORMAL LOW (ref 83.0–108.0)

## 2021-05-30 LAB — ETHANOL: Alcohol, Ethyl (B): <10 mg/dL (ref <10)

## 2021-05-30 LAB — TYPE AND SCREEN
ABO/RH(D): A POS
Antibody Screen: NEGATIVE

## 2021-05-30 LAB — RESP PANEL BY RT-PCR (FLU A&B, COVID) ARPGX2
Influenza A by PCR: NEGATIVE
Influenza B by PCR: NEGATIVE
SARS Coronavirus 2 by RT PCR: NEGATIVE

## 2021-05-30 LAB — PROTIME-INR
INR: 1.8 — ABNORMAL HIGH (ref 0.8–1.2)
Prothrombin Time: 21.1 seconds — ABNORMAL HIGH (ref 11.4–15.2)

## 2021-05-30 LAB — LIPASE, BLOOD: Lipase: 30 U/L (ref 11–51)

## 2021-05-30 LAB — SALICYLATE LEVEL: Salicylate Lvl: <7 mg/dL — ABNORMAL LOW (ref 7.0–30.0)

## 2021-05-30 LAB — LACTIC ACID, PLASMA: Lactic Acid, Venous: 7.5 mmol/L (ref 0.5–1.9)

## 2021-05-30 LAB — ACETAMINOPHEN LEVEL: Acetaminophen (Tylenol), Serum: <10 ug/mL — ABNORMAL LOW (ref 10–30)

## 2021-05-30 MED ORDER — PROPOFOL 1000 MG/100ML IV EMUL
INTRAVENOUS | Status: AC
  Filled 2021-05-30: qty 100

## 2021-05-30 MED ORDER — SODIUM CHLORIDE 0.9 % IV SOLN
750.0000 mg | Freq: Once | INTRAVENOUS | Status: DC
  Filled 2021-05-30: qty 7.5

## 2021-05-30 MED ORDER — SUCCINYLCHOLINE CHLORIDE 200 MG/10ML IV SOSY
150.0000 mg | PREFILLED_SYRINGE | Freq: Once | INTRAVENOUS | Status: AC
  Administered 2021-05-30: 150 mg via INTRAVENOUS

## 2021-05-30 MED ORDER — MORPHINE SULFATE (PF) 2 MG/ML IV SOLN: 1.0000 mg | INTRAVENOUS | Status: DC | PRN

## 2021-05-30 MED ORDER — SODIUM CHLORIDE 0.9 % IV SOLN: 3.0000 g | Freq: Once | INTRAVENOUS | Status: DC

## 2021-05-30 MED ORDER — SODIUM CHLORIDE 0.9 % IV BOLUS
1000.0000 mL | Freq: Once | INTRAVENOUS | Status: AC
  Administered 2021-05-30: 1000 mL via INTRAVENOUS

## 2021-05-30 MED ORDER — SODIUM CHLORIDE 0.9% FLUSH: 3.0000 mL | INTRAVENOUS | Status: DC | PRN

## 2021-05-30 MED ORDER — IOHEXOL 350 MG/ML SOLN
75.0000 mL | Freq: Once | INTRAVENOUS | Status: AC | PRN
  Administered 2021-05-30: 75 mL via INTRAVENOUS

## 2021-05-30 MED ORDER — PROTHROMBIN COMPLEX CONC HUMAN 500 UNITS IV KIT
3211.0000 [IU] | PACK | Status: AC
  Administered 2021-05-30: 3211 [IU] via INTRAVENOUS
  Filled 2021-05-30: qty 3211

## 2021-05-30 MED ORDER — SODIUM CHLORIDE 0.9% FLUSH: 3.0000 mL | Freq: Two times a day (BID) | INTRAVENOUS | Status: DC

## 2021-05-30 MED ORDER — ETOMIDATE 2 MG/ML IV SOLN
20.0000 mg | Freq: Once | INTRAVENOUS | Status: AC
  Administered 2021-05-30: 20 mg via INTRAVENOUS

## 2021-05-30 MED ORDER — SODIUM CHLORIDE 0.9 % IV SOLN: 250.0000 mL | INTRAVENOUS | Status: DC | PRN

## 2021-05-30 MED ORDER — MANNITOL 25 % IV SOLN
25.0000 g | Freq: Once | INTRAVENOUS | Status: AC
  Administered 2021-05-30: 25 g via INTRAVENOUS
  Filled 2021-05-30: qty 100

## 2021-05-30 MED ORDER — LIDOCAINE HCL (CARDIAC) PF 100 MG/5ML IV SOSY
100.0000 mg | PREFILLED_SYRINGE | Freq: Once | INTRAVENOUS | Status: AC
  Administered 2021-05-30: 100 mg via INTRAVENOUS

## 2021-05-30 MED ORDER — PROTHROMBIN COMPLEX CONC HUMAN 500 UNITS IV KIT: 50.0000 [IU]/kg | PACK | Status: DC

## 2021-05-30 MED ORDER — MORPHINE SULFATE (PF) 4 MG/ML IV SOLN
INTRAVENOUS | Status: AC
  Filled 2021-05-30: qty 1

## 2021-06-04 LAB — CULTURE, BLOOD (ROUTINE X 2): Culture: NO GROWTH

## 2021-06-19 NOTE — Progress Notes (Signed)
   06-13-21 0213  Clinical Encounter Type  Visited With Health care provider  Visit Type Initial  Chaplain Deloria Lair is on unit. Awaiting updates, available for family support.

## 2021-06-19 NOTE — ED Notes (Addendum)
Pt extubated 445am, RT at bedside. Ethlyn Daniels, RN at bedside. Family to be escorted to remain bedside with patient. Amy, Chaplain with family to accompany at bedside.

## 2021-06-19 NOTE — ED Notes (Addendum)
Pt apneic and pulseless. Time of death pronounced by Dr. Jamal Collin. Son and family at bedside. Amy, Chaplain and Ethlyn Daniels, RN also present in room.

## 2021-06-19 NOTE — Progress Notes (Signed)
   2021/06/19 0306  Clinical Encounter Type  Visited With Family  Visit Type Initial;Spiritual support;Social support  [place] provided hospitality and compassionate, non-anxious presence for Pt's daughter, [place]. Will remain on hand as we await updated condition.

## 2021-06-19 NOTE — ED Provider Notes (Signed)
[facility] Emergency Department Provider Note   ____________________________________________   Event Date/Time   First MD Initiated Contact with Patient May 31, 2021 0153     (approximate)  I have reviewed the triage vital signs and the nursing notes.   HISTORY  Chief Complaint Unresponsive  Level of V caveat: Limited by unresponsive state  HPI Valerie Bell is a 56 y.o. female brought to the ED via EMS from home status post cardiac arrest.  Patient with a history of DVT on Xarelto, seizures on Keppra who was last seen in her usual state of health around 10:30 PM.  Her children got a call from their grandmother who went to check on the patient and found her unresponsive in the bed.  She was found laying on her side with evidence of emesis.  EMS reports first responders initiated CPR.  By the time EMS arrived, patient had ROSC.  They report patient was extremely hypertensive in the field.  Patient arrives with [facility] airway in place, BVM, agonal respirations, no purposeful movements.      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   Spinal cord lesion (HCC)    Stroke [facility])     Patient Active Problem List   Diagnosis Date Noted   Left leg cellulitis 12/05/2020   UTI (urinary tract infection) 11/17/2020   Spinal cord lesion (HCC)    Stroke (HCC)    Thrombocytopenia (HCC)    Sacral pressure ulcer    Depression    AKI (acute kidney injury) (HCC)    Normocytic anemia 11/09/2020   Dyslipidemia 07/08/2019   Acute deep vein thrombosis (DVT) of left lower extremity (HCC) 06/20/2019   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HCC) 06/20/2019   Gastrostomy tube in place (HCC) 06/20/2019   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   Dysphagia 02/20/2019   Chronic deep vein thrombosis (DVT) of femoral vein of right lower extremity (HCC) 09/04/2018   Pressure injury of skin 09/02/2018   Acute metabolic encephalopathy 08/27/2018   Encephalitis 08/27/2018   Benign essential HTN 08/27/2018   Leucocytosis 08/27/2018   Lupus (HCC) 08/27/2018   DVT (deep vein thrombosis) in pregnancy 08/27/2018   Lethargy 08/24/2018   Weakness 01/07/2018   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 Authorizing Provider  amLODipine (NORVASC) 10 MG tablet Take 10 mg by mouth daily. 11/25/20   [provider]  atorvastatin (LIPITOR) 80 MG tablet Take 0.5 tablets (40 mg total) by mouth daily. 11/20/20   Hollice Espy, MD  baclofen (LIORESAL) 10 MG tablet Place 5 mg into feeding tube 3 (three) times daily. 09/20/20   [provider]  hydrALAZINE (APRESOLINE) 10 MG tablet Take 10 mg by mouth 3 (three) times daily.    [provider]  hydrocortisone 1 % ointment Apply 1 application topically 2 (two) times daily as needed for itching.    [provider]  levETIRAcetam (KEPPRA) 750 MG  tablet Take 1,500 mg by mouth 2 (two) times daily.    [provider]  mirtazapine (REMERON) 7.5 MG tablet Take 7.5 mg by mouth at bedtime.    [provider]  pantoprazole (PROTONIX) 20 MG tablet Take 20 mg by mouth daily.    [provider]  polyethylene glycol powder (GLYCOLAX/MIRALAX) 17 GM/SCOOP powder Take 17 g by mouth daily. 09/13/18   [provider]  rivaroxaban (XARELTO) 10 MG TABS tablet Take 10 mg by mouth every evening.    [provider]    Allergies Ace  inhibitors  Family History  Problem Relation Age of Onset   Arthritis Mother    Hypertension Mother    Breast cancer Neg Hx     Social History Social History   Tobacco Use   Smoking status: Former    Packs/day: 0.30    Years: 4.00    Pack years: 1.20    Types: Cigarettes    Quit date: 12/30/2003    Years since quitting: 17.4   Smokeless tobacco: Never  Substance Use Topics   Alcohol use: No    Alcohol/week: 0.0 standard drinks   Drug use: Yes    Types: Marijuana    Comment: 2 days ago.     Review of Systems  Constitutional: No fever/chills Eyes: No visual changes. ENT: No sore throat. Cardiovascular: Denies chest pain. Respiratory: Denies shortness of breath. Gastrointestinal: No abdominal pain.  No nausea, no vomiting.  No diarrhea.  No constipation. Genitourinary: Negative for dysuria. Musculoskeletal: Negative for back pain. Skin: Negative for rash. Neurological: Negative for headaches, focal weakness or numbness. Unknown  ____________________________________________   PHYSICAL EXAM:  VITAL SIGNS: ED Triage Vitals [06/08/2021 0148]  Enc Vitals Group     BP (!) 85/73     Pulse Rate (!) 120     Resp (!) 24     Temp      Temp src      SpO2 90 %     Weight      Height      Head Circumference      Peak Flow      Pain Score      Pain Loc      Pain Edu?      Excl. in [place]?     Constitutional: Unresponsive.   Eyes: Pupils fixed and dilated. Head: Atraumatic. Nose: Atraumatic. Mouth/Throat: Mucous membranes are mildly dry.  No dental malocclusion. Neck: No stridor.   Cardiovascular: Normal rate, regular rhythm. Grossly normal heart sounds.  Thready peripheral circulation. Respiratory: Agonal respirations. Gastrointestinal: Soft, no distention. No abdominal bruits.  Musculoskeletal: No lower extremity edema.  IO left lower extremity.  No joint effusions. Neurologic: Unresponsive.  No movement in any extremity.   Skin:  Skin is cool, dry and intact. No  rash noted. Psychiatric: Unable to assess. ____________________________________________   LABS (all labs ordered are listed, but only abnormal results are displayed)  Labs Reviewed  CBC WITH DIFFERENTIAL/PLATELET - Abnormal; Notable for the following components:      Result Value   WBC 16.5 (*)    RBC 3.66 (*)    Hemoglobin 11.1 (*)    HCT 35.9 (*)    Platelets 62 (*)    Neutro Abs 14.0 (*)    Abs Immature Granulocytes 0.10 (*)    All other components within normal limits  ACETAMINOPHEN LEVEL - Abnormal; Notable for the following components:   Acetaminophen (Tylenol), Serum <10 (*)    All other  components within normal limits  SALICYLATE LEVEL - Abnormal; Notable for the following components:   Salicylate Lvl <0.0 (*)    All other components within normal limits  LACTIC ACID, PLASMA - Abnormal; Notable for the following components:   Lactic Acid, Venous 7.5 (*)    All other components within normal limits  URINALYSIS, ROUTINE W REFLEX MICROSCOPIC - Abnormal; Notable for the following components:   Color, Urine YELLOW (*)    APPearance HAZY (*)    Glucose, UA 150 (*)    Hgb urine dipstick SMALL (*)    Protein, ur >=300 (*)    RBC / HPF >50 (*)    Bacteria, UA RARE (*)    All other components within normal limits  BLOOD GAS, ARTERIAL - Abnormal; Notable for the following components:   pH, Arterial 7.34 (*)    pO2, Arterial 64 (*)    Bicarbonate 18.2 (*)    Acid-base deficit 7.6 (*)    All other components within normal limits  PROTIME-INR - Abnormal; Notable for the following components:   Prothrombin Time 21.1 (*)    INR 1.8 (*)    All other components within normal limits  HEPARIN LEVEL (UNFRACTIONATED) - Abnormal; Notable for the following components:   Heparin Unfractionated <0.10 (*)    All other components within normal limits  COMPREHENSIVE METABOLIC PANEL - Abnormal; Notable for the following components:   Sodium 134 (*)    Potassium 3.1 (*)    CO2 17 (*)     Glucose, Bld 266 (*)    Creatinine, Ser 1.35 (*)    Calcium 8.0 (*)    Albumin 2.7 (*)    AST 65 (*)    GFR, Estimated 47 (*)    All other components within normal limits  TROPONIN I (HIGH SENSITIVITY) - Abnormal; Notable for the following components:   Troponin I (High Sensitivity) 2,061 (*)    All other components within normal limits  TROPONIN I (HIGH SENSITIVITY) - Abnormal; Notable for the following components:   Troponin I (High Sensitivity) 3,574 (*)    All other components within normal limits  RESP PANEL BY RT-PCR (FLU A&B, COVID) [id number]  CULTURE, BLOOD (ROUTINE X 2)  CULTURE, BLOOD (ROUTINE X 2)  ETHANOL  LIPASE, BLOOD  LACTIC ACID, PLASMA  TYPE AND SCREEN  TYPE AND SCREEN   ____________________________________________  EKG  ED ECG REPORT I, Janene Yousuf J, the attending physician, personally viewed and interpreted this ECG.   Date: June 17, 2021  EKG Time: 0159  Rate: 100  Rhythm: sinus tachycardia  Axis: Normal  Intervals:none  ST&T Change: Nonspecific  ____________________________________________  RADIOLOGY I, Mahir Prabhakar J, personally viewed and evaluated these images (plain radiographs) as part of my medical decision making, as well as reviewing the written report by the radiologist.  ED MD interpretation: Large intraparenchymal hemorrhage with associated midline shift and subdural extension, extensive edema; CTA chest without PE chest x-ray ET tube 3.5 cm above the carina, will advance 2cm, NG tube in place  Official radiology report(s): CT Head Wo Contrast  Result Date: June 17, 2021 CLINICAL DATA:  Initial evaluation for altered mental status, found unresponsive. EXAM: CT HEAD WITHOUT CONTRAST TECHNIQUE: Contiguous axial images were obtained from the base of the skull through the vertex without intravenous contrast. COMPARISON:  Prior CT from 04/09/2021. FINDINGS: Brain: Large intraparenchymal hemorrhage centered at the mid right cerebral hemisphere measures  approximately 7.4 x 8.8 x 5.0 cm (estimated volume 163 mL). Associated extra-axial extension with a mixed density extra-axial  subdural hemorrhage measuring up to 9 mm overlying the right frontal convexity. Subdural blood seen extending along the falx and tentorium as well. Probable scattered areas of subarachnoid hemorrhage within the right cerebral hemisphere. Intraventricular hemorrhage seen as well with blood seen within the right lateral ventricle and fourth ventricle. There is extensive edema throughout the right cerebral hemisphere with loss of gray-white matter differentiation. Poorly delineated gray-white matter differentiation seen within the left cerebral hemisphere as well. Severe right to left shift measuring up to 2.2 cm. Secondary partial effacement of the right lateral ventricle with asymmetric dilatation of the left lateral ventricle, consistent with trapping. Basilar cisterns are effaced. Additional scattered infratentorial hemorrhage seen involving the mid and right cerebellum. Dominant component measures up to 1.4 cm at the mid cerebellum. Subdural extension with blood seen surrounding the right cerebellum and extending along the inferior aspect of the tentorium noted as well. Cerebellar tonsils partially descend through the foramen magnum, consistent with transtentorial herniation. Vascular: No visible hyperdense vessel. Skull: Scalp soft tissues demonstrate no acute finding. Calvarium intact. Sinuses/Orbits: Visualized globes and orbital soft tissues demonstrate no acute finding. Visualized paranasal sinuses and mastoid air cells are clear. Other: None. IMPRESSION: 1. Large intraparenchymal hemorrhage centered at the mid right cerebral hemisphere, estimated volume 163 mL. Associated extra-axial extension with a mixed density extra-axial subdural hemorrhage measuring up to 9 mm overlying the right frontal convexity, with additional extension along the falx. 2. Additional scattered infratentorial  intraparenchymal and extra-axial hemorrhage involving the right cerebellum as above. 3. Associated intraventricular extension with blood in the right lateral and fourth ventricles. 4. Extensive edema with severe right to left midline shift measuring up to 2.2 cm with left ventricular trapping. Diffuse sulcal and basilar cistern effacement with evidence of transtentorial herniation Critical Value/emergent results were called by telephone at the time of interpretation on 12-Jun-2021 at 3:33 am to provider Natisha Trzcinski , who verbally acknowledged these results. Electronically Signed   By: Jeannine Boga M.D.   On: 2021-06-12 03:35   CT Angio Chest PE W/Cm &/Or Wo Cm  Result Date: 12-Jun-2021 CLINICAL DATA:  Post cardiac arrest, unresponsive, altered mental status, evaluate for PE EXAM: CT ANGIOGRAPHY CHEST WITH CONTRAST TECHNIQUE: Multidetector CT imaging of the chest was performed using the standard protocol during bolus administration of intravenous contrast. Multiplanar CT image reconstructions and MIPs were obtained to evaluate the vascular anatomy. CONTRAST:  31mL OMNIPAQUE IOHEXOL 350 MG/ML SOLN COMPARISON:  Chest radiograph dated 06/12/2021. CTA chest dated 09/12/2020. FINDINGS: Cardiovascular: Satisfactory opacification of the bilateral pulmonary arteries to the lobar level. No evidence of pulmonary embolism. Although not tailored for evaluation of the thoracic aorta, there is no evidence of thoracic aortic aneurysm or dissection. Very mild atherosclerotic calcifications of the arch. The heart is normal in size.  No pericardial effusion. Mediastinum/Nodes: No suspicious mediastinal lymphadenopathy. Visualized thyroid is unremarkable. Lungs/Pleura: Endotracheal tube terminates 2.4 cm above the carina. Lobar consolidation in the right lower lobe. Additional faint patchy/nodular opacities in the posterior right upper and middle lobes. This appearance suggests multifocal pneumonia, possibly on the basis of  aspiration. Minimal left basilar atelectasis. No pleural effusion or pneumothorax. Upper Abdomen: Enteric tube courses into the stomach. Musculoskeletal: Visualized osseous structures are within normal limits. Review of the MIP images confirms the above findings. IMPRESSION: No evidence of pulmonary embolism. Multifocal right lung pneumonia with lobar consolidation in the right lower lobe, possibly on the basis of aspiration. Endotracheal tube terminates 2.4 cm above the carina. Enteric tube courses into  the stomach. Aortic Atherosclerosis (ICD10-I70.0). Electronically Signed   By: Julian Hy M.D.   On: 06/08/21 03:26   DG Chest Port 1 View  Result Date: 2021/06/08 CLINICAL DATA:  Post intubation EXAM: PORTABLE CHEST 1 VIEW COMPARISON:  11/17/2020 FINDINGS: Endotracheal tube terminates 3.5 cm above the carina. Eventration of right hemidiaphragm with associated mild right basilar atelectasis. Left lung is clear. No pleural effusion or pneumothorax. The heart is normal in size. Enteric tube course of the stomach. Defibrillator pads overlying the left hemithorax. IMPRESSION: Endotracheal tube terminates 3.5 cm above the carina. Additional support apparatus as above. Electronically Signed   By: Julian Hy M.D.   On: 06-08-2021 02:20    ____________________________________________   PROCEDURES  Procedure(s) performed (including Critical Care):  .1-3 Lead EKG Interpretation Performed by: Paulette Blanch, MD Authorized by: Paulette Blanch, MD     Interpretation: normal     ECG rate:  93   ECG rate assessment: normal     Rhythm: sinus rhythm     Ectopy: none     Conduction: normal   Comments:     Patient placed on cardiac monitor to evaluate for arrhythmias Procedure Name: Intubation Date/Time: 2021/06/08 2:18 AM Performed by: Paulette Blanch, MD Pre-anesthesia Checklist: Patient identified, Patient being monitored, Emergency Drugs available, Timeout performed and Suction  available Oxygen Delivery Method: Non-rebreather mask Preoxygenation: Pre-oxygenation with 100% oxygen Induction Type: Rapid sequence Ventilation: Mask ventilation without difficulty Laryngoscope Size: Glidescope and 3 Grade View: Grade I Tube size: 7.5 mm Number of attempts: 1 Airway Equipment and Method: Rigid stylet Placement Confirmation: ETT inserted through vocal cords under direct vision, CO2 detector and Breath sounds checked- equal and bilateral Dental Injury: Teeth and Oropharynx as per pre-operative assessment    CRITICAL CARE Performed by: Paulette Blanch   Total critical care time: 60 minutes  Critical care time was exclusive of separately billable procedures and treating other patients.  Critical care was necessary to treat or prevent imminent or life-threatening deterioration.  Critical care was time spent personally by me on the following activities: development of treatment plan with patient and/or surrogate as well as nursing, discussions with consultants, evaluation of patient's response to treatment, examination of patient, obtaining history from patient or surrogate, ordering and performing treatments and interventions, ordering and review of laboratory studies, ordering and review of radiographic studies, pulse oximetry and re-evaluation of patient's condition.    ____________________________________________   INITIAL IMPRESSION / ASSESSMENT AND PLAN / ED COURSE  As part of my medical decision making, I reviewed the following data within the [place] History obtained from family, Nursing notes reviewed and incorporated, Labs reviewed, EKG interpreted, Old chart reviewed, Radiograph reviewed, Discussed with admitting physician, and Notes from prior ED visits     56 year old female brought to the ED status post cardiac arrest, found unresponsive in her bed with unknown downtime.  Differential diagnosis includes but is not limited to ACS, CVA,  ICH, seizures, infectious, metabolic etiologies, etc.  Patient was intubated for airway protection.  No purposeful movements assessed prior to intubation.  Pupils fixed and dilated.  Will send for emergent CT head.  Clinical Course as of 06-08-21 0557  Mon June 08, 2021  0206 Discussed with ICU NP Domingo Pulse who will evaluate patient for admission. [JS]  0206 Updated patient's son and his wife of current prognosis.  They stated she is FULL CODE and desires everything to be done.  Daughter states patient was laying around  all day yesterday but that is not unusual for the patient.  To their knowledge, patient's children are not aware that patient had recent fall/injury/trauma, or that she was feeling poorly.  They do acknowledge that patient is not adherent to her medications.  They believe it has been over 6 months since patient last had a seizure. [JS]  0220 Given patient's medical nonadherence, will also obtain CTA chest to evaluate for PE.  Will administer IV Unasyn for likely aspiration.  Audible rales heard after intubation; patient suctioned by RT. [JS]  0259 Wet read of CT Head - catastrophic ICH with midline shift. Discussed with Dr. Lacinda Axon from neurosurgery; there is really nothing to offer for this unfortunate patient with a catastrophic head bleed.  I did ask about Keppra and Mannitol; he states these would not hurt her so I will go ahead and order.  Will administer IV Kcentra.  Will update family. [JS]  [id number] Updated patient's daughter in law and showed her a picture of patient's CT head.  Encouraged her to discuss with family members to make patient comfort care as this is an unrecoverable injury.  ICU intensivist in the ED evaluating patient and will also speak with her family regarding patient's grim prognosis. [JS]  [id number] ICU intensivist discussed with patient's family.  Son is still processing and has not yet made a decision regarding patient's CODE STATUS.  She will remain in the ED until he  decides. [JS]  67 Patient's son, daughter-in-law, mother and grandchildren at bedside. Son and mother have made the decision to make patient comfort care, DNR. ICU intensivist also present and aware. [JS]  1194 ETT removed. Patient made comfort care. Will bring family back at this time. [JS]  [id number] Patient made no attempts at spontaneous respirations after comfort care measures were initiated. She become bradycardic and asystolic. No pulse on auscultation. Family at bedside. TOD [id number]. [JS]    Clinical Course User Index [JS] Paulette Blanch, MD     ____________________________________________   FINAL CLINICAL IMPRESSION(S) / ED DIAGNOSES  Final diagnoses:  Cardiac arrest ([place])  Hypotension, unspecified hypotension type  Aspiration pneumonia, unspecified aspiration pneumonia type, unspecified laterality, unspecified part of lung ([place])  Hypothermia, initial encounter  NSTEMI (non-ST elevated myocardial infarction) ([place])  Lactic acidosis  Intraparenchymal hemorrhage of brain [facility])     ED Discharge Orders     None        Note:  This document was prepared using Dragon voice recognition software and may include unintentional dictation errors.    Paulette Blanch, MD 05/31/2021 [phone]

## 2021-06-19 NOTE — ED Notes (Signed)
Bair hugger placed on pt.

## 2021-06-19 NOTE — Progress Notes (Signed)
[place] Referral made [phone]. Asked to call with Cardiac time of death.

## 2021-06-19 NOTE — ED Notes (Signed)
Pt at [place] with Rodman Key, RN and Lenna Sciara, RN accompanying pt.

## 2021-06-19 NOTE — Progress Notes (Signed)
   06-13-21 0431  Clinical Encounter Type  Visited With Family;Health care provider  Visit Type Spiritual support;Social support  Spiritual Encounters  Spiritual Needs Grief support  Valerie Bell gathered family, requested Dr. Asher Muir presence to offer another family meeting for their understanding. Chaplain helped family move to consult room to wait as care team prepares for extubation. Standing by to notify family so they can return to bedside.

## 2021-06-19 NOTE — Code Documentation (Addendum)
12 lead EKG 0159

## 2021-06-19 NOTE — Code Documentation (Addendum)
Zoll pads placed immediately on presentation  Patient bagged with BVM by EMS 1 L Nacl fluids bolus through IO placed via EMS Lidocaine 100 mg IV administered 0148 Etomidate 20 mg IV administered 0148 Succ 150 mg IV administered 0149 Intubation at 150a by Dr. Beather Arbour- 7.5 fr tube placed, 22 at teeth Additional 1 L NaCL IV fluids bolus through R AC peripheral IV catheter Transitioned from BVM to ventilator 0153 - SPO2 100% 14 fr OG tube placed 0202 and verified via auscultation and gastric fluid aspiration 14 fr temp foley placed 0208

## 2021-06-19 NOTE — ED Notes (Signed)
Pt arrived via EMS - pt was found unresponsive by family at home when fire dept. arrived on scene. CPR initiated and pt had ROSC approximately 0100 and agonal respirations. Patient apneic and bagged en route and on presentation with BVM. Hx: stroke and sz and pt on blood thinners.  Pt given Narcan en route. Respiratory present in room on arrival.

## 2021-06-19 DEATH — deceased

## 2021-08-15 IMAGING — CR DG TIBIA/FIBULA 2V*L*
4 series · 4 of 4 positions shown · non-contrast
Comparison: None.

CLINICAL DATA: Leg swelling

EXAM:
LEFT TIBIA AND FIBULA - 2 VIEW

[tibia ap (1 of 2)]
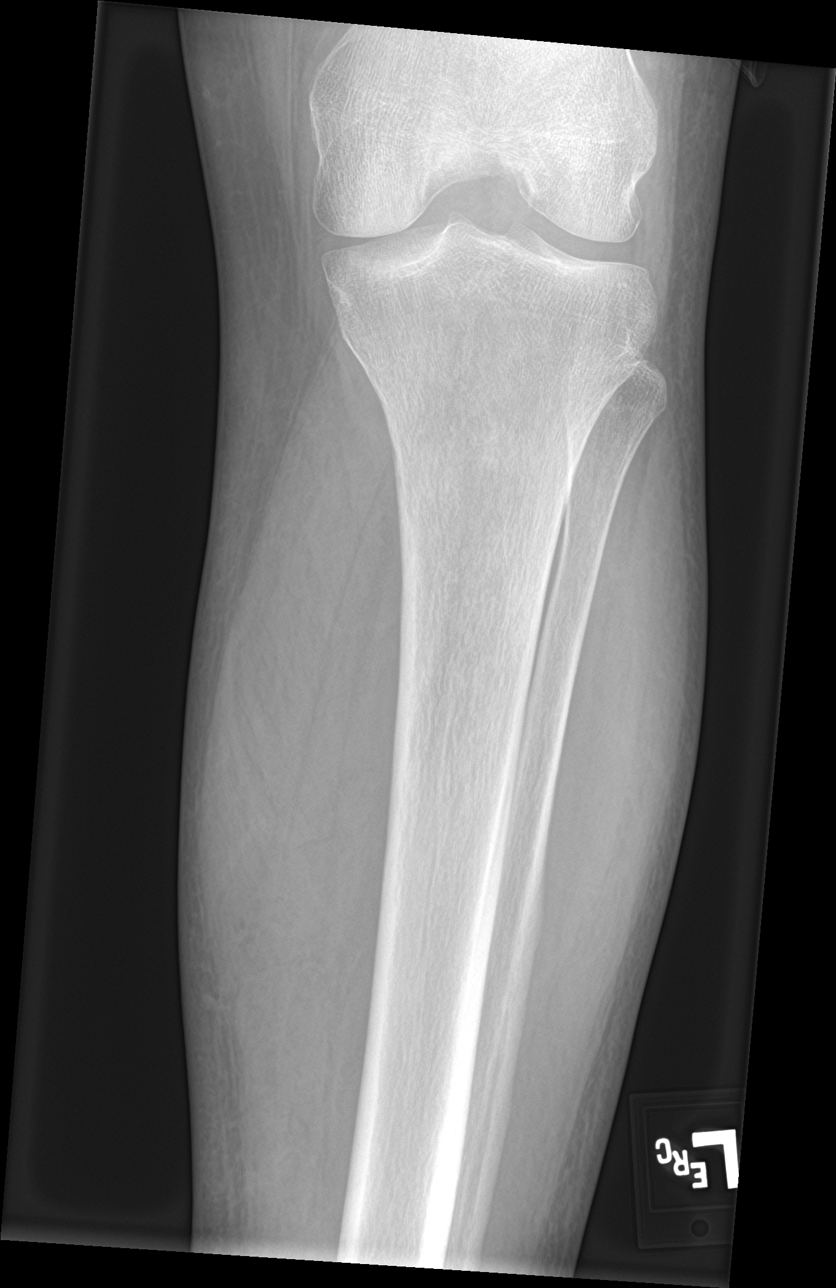

[tibia ap (2 of 2)]
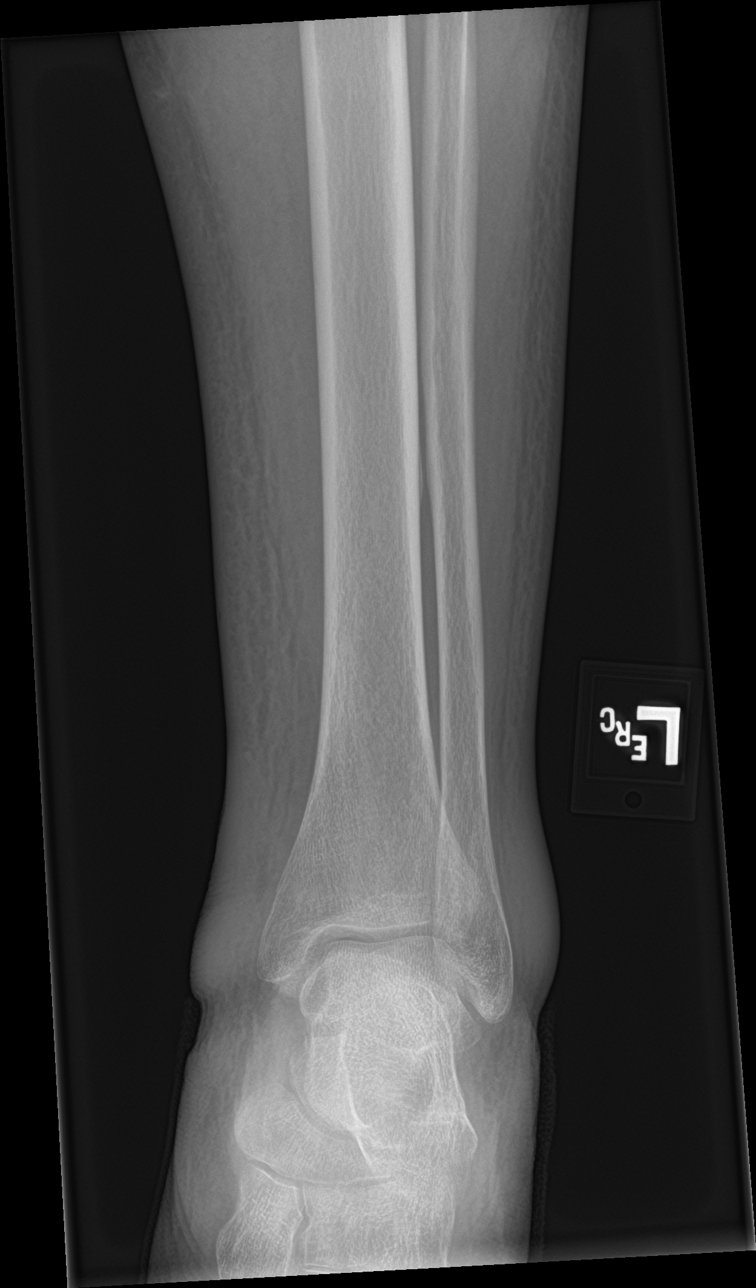

[tibia lat (1 of 2)]
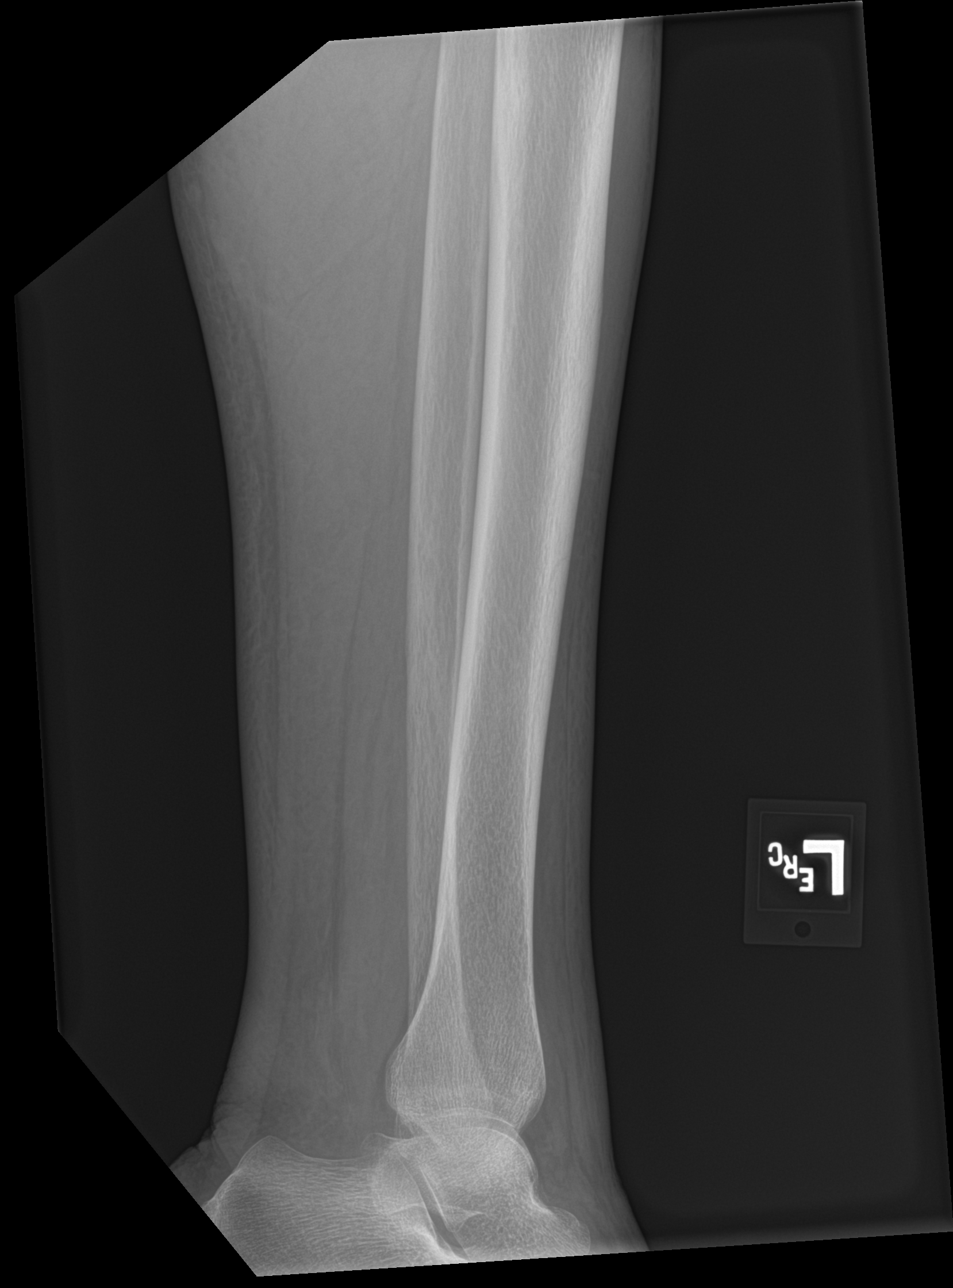

[tibia lat (2 of 2)]
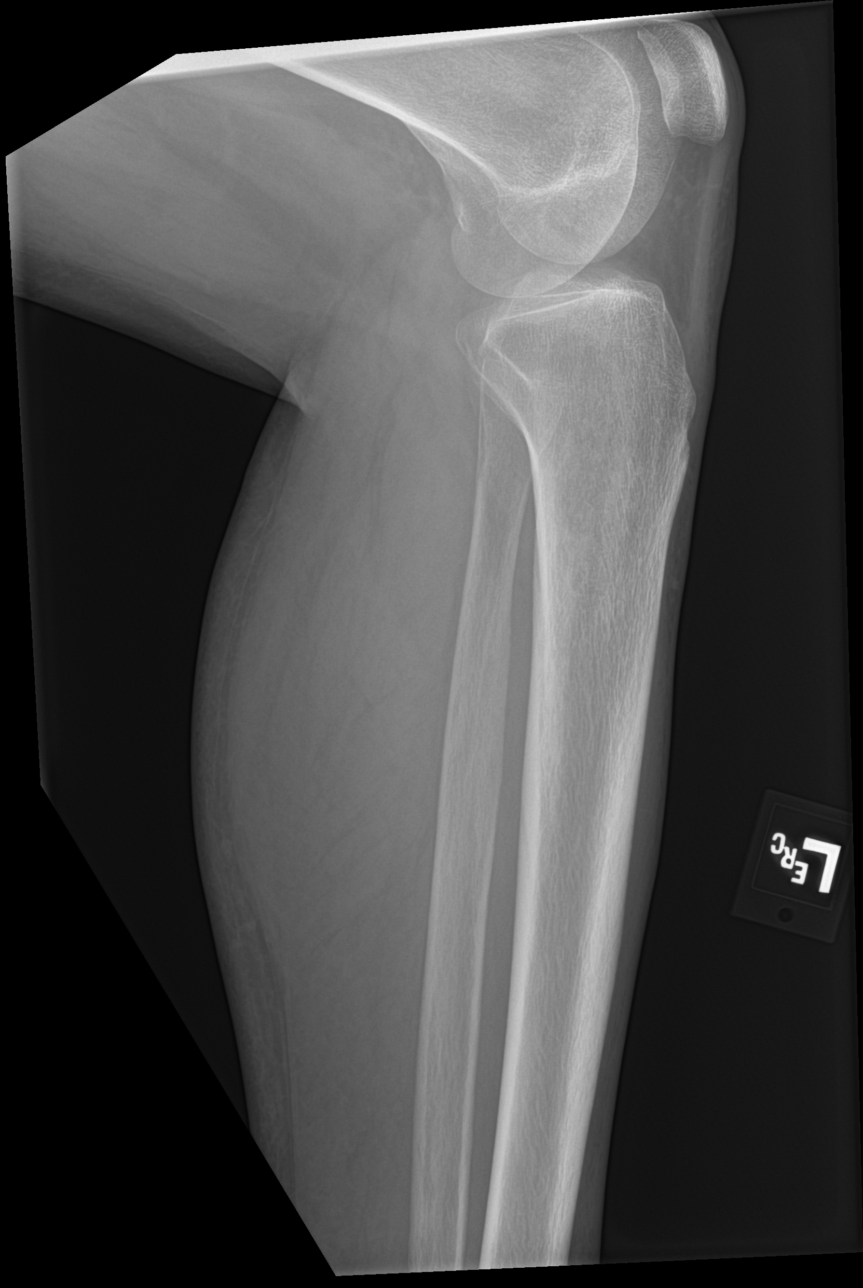

[4 of 4 positions shown; findings below may reference images not displayed]

FINDINGS: There is no evidence of fracture or other focal bone lesions. Soft
tissues are unremarkable.
IMPRESSION: Negative.
